# Patient Record
Sex: Male | Born: 1940 | Race: White | Hispanic: No | Marital: Married | Smoking: Former smoker
Health system: Southern US, Community
[De-identification: ages and names within clinical notes are randomized; demographics above are authoritative.]

## PROBLEM LIST (undated history)

## (undated) DIAGNOSIS — M1990 Unspecified osteoarthritis, unspecified site: Secondary | ICD-10-CM

## (undated) DIAGNOSIS — F329 Major depressive disorder, single episode, unspecified: Secondary | ICD-10-CM

## (undated) DIAGNOSIS — N429 Disorder of prostate, unspecified: Secondary | ICD-10-CM

## (undated) DIAGNOSIS — I2510 Atherosclerotic heart disease of native coronary artery without angina pectoris: Secondary | ICD-10-CM

## (undated) DIAGNOSIS — K8020 Calculus of gallbladder without cholecystitis without obstruction: Secondary | ICD-10-CM

## (undated) DIAGNOSIS — E1142 Type 2 diabetes mellitus with diabetic polyneuropathy: Secondary | ICD-10-CM

## (undated) DIAGNOSIS — E349 Endocrine disorder, unspecified: Secondary | ICD-10-CM

## (undated) DIAGNOSIS — N401 Enlarged prostate with lower urinary tract symptoms: Secondary | ICD-10-CM

## (undated) DIAGNOSIS — E538 Deficiency of other specified B group vitamins: Secondary | ICD-10-CM

## (undated) DIAGNOSIS — R7989 Other specified abnormal findings of blood chemistry: Secondary | ICD-10-CM

## (undated) DIAGNOSIS — N183 Chronic kidney disease, stage 3 unspecified: Secondary | ICD-10-CM

## (undated) DIAGNOSIS — J189 Pneumonia, unspecified organism: Secondary | ICD-10-CM

## (undated) DIAGNOSIS — E785 Hyperlipidemia, unspecified: Secondary | ICD-10-CM

## (undated) DIAGNOSIS — K8050 Calculus of bile duct without cholangitis or cholecystitis without obstruction: Secondary | ICD-10-CM

## (undated) DIAGNOSIS — R269 Unspecified abnormalities of gait and mobility: Secondary | ICD-10-CM

## (undated) DIAGNOSIS — G4730 Sleep apnea, unspecified: Secondary | ICD-10-CM

## (undated) DIAGNOSIS — R918 Other nonspecific abnormal finding of lung field: Secondary | ICD-10-CM

## (undated) DIAGNOSIS — Z8781 Personal history of (healed) traumatic fracture: Secondary | ICD-10-CM

## (undated) DIAGNOSIS — M5090 Cervical disc disorder, unspecified, unspecified cervical region: Secondary | ICD-10-CM

## (undated) DIAGNOSIS — E119 Type 2 diabetes mellitus without complications: Secondary | ICD-10-CM

## (undated) DIAGNOSIS — I509 Heart failure, unspecified: Secondary | ICD-10-CM

## (undated) DIAGNOSIS — R159 Full incontinence of feces: Secondary | ICD-10-CM

## (undated) DIAGNOSIS — I2699 Other pulmonary embolism without acute cor pulmonale: Secondary | ICD-10-CM

## (undated) DIAGNOSIS — M109 Gout, unspecified: Secondary | ICD-10-CM

## (undated) DIAGNOSIS — I10 Essential (primary) hypertension: Secondary | ICD-10-CM

## (undated) DIAGNOSIS — N529 Male erectile dysfunction, unspecified: Secondary | ICD-10-CM

## (undated) DIAGNOSIS — Z9229 Personal history of other drug therapy: Secondary | ICD-10-CM

## (undated) DIAGNOSIS — J986 Disorders of diaphragm: Secondary | ICD-10-CM

## (undated) DIAGNOSIS — R634 Abnormal weight loss: Secondary | ICD-10-CM

## (undated) DIAGNOSIS — I5189 Other ill-defined heart diseases: Secondary | ICD-10-CM

## (undated) DIAGNOSIS — R5381 Other malaise: Secondary | ICD-10-CM

## (undated) DIAGNOSIS — R609 Edema, unspecified: Secondary | ICD-10-CM

## (undated) DIAGNOSIS — K219 Gastro-esophageal reflux disease without esophagitis: Secondary | ICD-10-CM

## (undated) DIAGNOSIS — K1379 Other lesions of oral mucosa: Secondary | ICD-10-CM

## (undated) DIAGNOSIS — R3915 Urgency of urination: Secondary | ICD-10-CM

## (undated) HISTORY — PX: OTHER SURGICAL HISTORY: SHX169

---

## 2012-09-02 HISTORY — PX: CARDIAC CATHETERIZATION: SHX172

## 2015-02-17 ENCOUNTER — Other Ambulatory Visit: Payer: Self-pay | Admitting: Orthopaedic Surgery

## 2015-02-17 DIAGNOSIS — M4716 Other spondylosis with myelopathy, lumbar region: Secondary | ICD-10-CM

## 2015-02-17 DIAGNOSIS — M4712 Other spondylosis with myelopathy, cervical region: Secondary | ICD-10-CM

## 2015-02-17 DIAGNOSIS — M47894 Other spondylosis, thoracic region: Secondary | ICD-10-CM

## 2015-11-22 DIAGNOSIS — M109 Gout, unspecified: Secondary | ICD-10-CM | POA: Diagnosis present

## 2015-11-22 DIAGNOSIS — R5383 Other fatigue: Secondary | ICD-10-CM

## 2015-11-22 DIAGNOSIS — E538 Deficiency of other specified B group vitamins: Secondary | ICD-10-CM | POA: Insufficient documentation

## 2015-11-22 DIAGNOSIS — J8410 Pulmonary fibrosis, unspecified: Secondary | ICD-10-CM | POA: Insufficient documentation

## 2015-11-22 DIAGNOSIS — K219 Gastro-esophageal reflux disease without esophagitis: Secondary | ICD-10-CM | POA: Diagnosis present

## 2015-11-22 DIAGNOSIS — R609 Edema, unspecified: Secondary | ICD-10-CM | POA: Insufficient documentation

## 2015-11-22 DIAGNOSIS — E782 Mixed hyperlipidemia: Secondary | ICD-10-CM | POA: Insufficient documentation

## 2015-11-22 DIAGNOSIS — Z79899 Other long term (current) drug therapy: Secondary | ICD-10-CM

## 2015-11-22 DIAGNOSIS — N529 Male erectile dysfunction, unspecified: Secondary | ICD-10-CM | POA: Insufficient documentation

## 2015-11-22 DIAGNOSIS — E349 Endocrine disorder, unspecified: Secondary | ICD-10-CM | POA: Insufficient documentation

## 2015-11-22 DIAGNOSIS — F329 Major depressive disorder, single episode, unspecified: Secondary | ICD-10-CM | POA: Insufficient documentation

## 2015-11-22 DIAGNOSIS — R918 Other nonspecific abnormal finding of lung field: Secondary | ICD-10-CM | POA: Insufficient documentation

## 2015-11-22 DIAGNOSIS — M5090 Cervical disc disorder, unspecified, unspecified cervical region: Secondary | ICD-10-CM | POA: Insufficient documentation

## 2015-11-22 DIAGNOSIS — Z7901 Long term (current) use of anticoagulants: Secondary | ICD-10-CM

## 2015-11-22 DIAGNOSIS — R5381 Other malaise: Secondary | ICD-10-CM | POA: Insufficient documentation

## 2015-11-22 DIAGNOSIS — I10 Essential (primary) hypertension: Secondary | ICD-10-CM | POA: Diagnosis present

## 2016-01-24 DIAGNOSIS — J986 Disorders of diaphragm: Secondary | ICD-10-CM | POA: Insufficient documentation

## 2016-01-24 DIAGNOSIS — R269 Unspecified abnormalities of gait and mobility: Secondary | ICD-10-CM

## 2016-04-15 DIAGNOSIS — M159 Polyosteoarthritis, unspecified: Secondary | ICD-10-CM | POA: Insufficient documentation

## 2016-04-15 DIAGNOSIS — M8949 Other hypertrophic osteoarthropathy, multiple sites: Secondary | ICD-10-CM | POA: Insufficient documentation

## 2016-04-15 DIAGNOSIS — M150 Primary generalized (osteo)arthritis: Secondary | ICD-10-CM

## 2016-04-16 DIAGNOSIS — E1142 Type 2 diabetes mellitus with diabetic polyneuropathy: Secondary | ICD-10-CM | POA: Diagnosis present

## 2016-04-16 DIAGNOSIS — B351 Tinea unguium: Secondary | ICD-10-CM | POA: Insufficient documentation

## 2016-04-16 DIAGNOSIS — M79671 Pain in right foot: Secondary | ICD-10-CM | POA: Insufficient documentation

## 2016-05-16 DIAGNOSIS — L84 Corns and callosities: Secondary | ICD-10-CM | POA: Insufficient documentation

## 2016-05-16 DIAGNOSIS — M2041 Other hammer toe(s) (acquired), right foot: Secondary | ICD-10-CM | POA: Insufficient documentation

## 2017-02-19 DIAGNOSIS — Z86711 Personal history of pulmonary embolism: Secondary | ICD-10-CM | POA: Diagnosis present

## 2017-03-24 ENCOUNTER — Other Ambulatory Visit: Payer: Self-pay | Admitting: Anesthesiology

## 2017-04-25 ENCOUNTER — Encounter (HOSPITAL_COMMUNITY): Admission: RE | Payer: Self-pay | Source: Ambulatory Visit

## 2017-04-25 ENCOUNTER — Ambulatory Visit (HOSPITAL_COMMUNITY): Admission: RE | Admit: 2017-04-25 | Payer: Medicare HMO | Source: Ambulatory Visit | Admitting: Anesthesiology

## 2017-04-25 SURGERY — SPINAL CORD STIMULATOR BATTERY EXCHANGE
Anesthesia: General

## 2017-08-22 DIAGNOSIS — R2689 Other abnormalities of gait and mobility: Secondary | ICD-10-CM | POA: Diagnosis present

## 2017-12-02 DIAGNOSIS — N401 Enlarged prostate with lower urinary tract symptoms: Secondary | ICD-10-CM | POA: Diagnosis present

## 2017-12-02 DIAGNOSIS — R350 Frequency of micturition: Secondary | ICD-10-CM

## 2018-01-07 DIAGNOSIS — E6609 Other obesity due to excess calories: Secondary | ICD-10-CM

## 2018-01-07 DIAGNOSIS — Z6830 Body mass index (BMI) 30.0-30.9, adult: Secondary | ICD-10-CM

## 2018-07-30 DIAGNOSIS — N183 Chronic kidney disease, stage 3 unspecified: Secondary | ICD-10-CM | POA: Diagnosis present

## 2018-12-28 ENCOUNTER — Encounter (HOSPITAL_COMMUNITY): Payer: Self-pay

## 2018-12-28 ENCOUNTER — Inpatient Hospital Stay (HOSPITAL_COMMUNITY)
Admission: EM | Admit: 2018-12-28 | Discharge: 2019-01-02 | DRG: 418 | Disposition: A | Discharge status: Home / Self Care | Payer: Medicare HMO | Source: Other Acute Inpatient Hospital | Attending: Internal Medicine | Admitting: Internal Medicine

## 2018-12-28 DIAGNOSIS — I5032 Chronic diastolic (congestive) heart failure: Secondary | ICD-10-CM | POA: Diagnosis present

## 2018-12-28 DIAGNOSIS — E6609 Other obesity due to excess calories: Secondary | ICD-10-CM | POA: Diagnosis present

## 2018-12-28 DIAGNOSIS — R2689 Other abnormalities of gait and mobility: Secondary | ICD-10-CM | POA: Diagnosis not present

## 2018-12-28 DIAGNOSIS — Z96641 Presence of right artificial hip joint: Secondary | ICD-10-CM | POA: Diagnosis present

## 2018-12-28 DIAGNOSIS — R791 Abnormal coagulation profile: Secondary | ICD-10-CM | POA: Diagnosis present

## 2018-12-28 DIAGNOSIS — N183 Chronic kidney disease, stage 3 unspecified: Secondary | ICD-10-CM | POA: Diagnosis present

## 2018-12-28 DIAGNOSIS — R1011 Right upper quadrant pain: Secondary | ICD-10-CM | POA: Diagnosis present

## 2018-12-28 DIAGNOSIS — Z79891 Long term (current) use of opiate analgesic: Secondary | ICD-10-CM

## 2018-12-28 DIAGNOSIS — Z87891 Personal history of nicotine dependence: Secondary | ICD-10-CM

## 2018-12-28 DIAGNOSIS — R159 Full incontinence of feces: Secondary | ICD-10-CM | POA: Diagnosis present

## 2018-12-28 DIAGNOSIS — K429 Umbilical hernia without obstruction or gangrene: Secondary | ICD-10-CM | POA: Diagnosis present

## 2018-12-28 DIAGNOSIS — K660 Peritoneal adhesions (postprocedural) (postinfection): Secondary | ICD-10-CM | POA: Diagnosis present

## 2018-12-28 DIAGNOSIS — R634 Abnormal weight loss: Secondary | ICD-10-CM | POA: Diagnosis present

## 2018-12-28 DIAGNOSIS — K828 Other specified diseases of gallbladder: Secondary | ICD-10-CM | POA: Diagnosis present

## 2018-12-28 DIAGNOSIS — K8050 Calculus of bile duct without cholangitis or cholecystitis without obstruction: Secondary | ICD-10-CM | POA: Diagnosis present

## 2018-12-28 DIAGNOSIS — I2510 Atherosclerotic heart disease of native coronary artery without angina pectoris: Secondary | ICD-10-CM | POA: Diagnosis present

## 2018-12-28 DIAGNOSIS — R269 Unspecified abnormalities of gait and mobility: Secondary | ICD-10-CM

## 2018-12-28 DIAGNOSIS — J449 Chronic obstructive pulmonary disease, unspecified: Secondary | ICD-10-CM | POA: Diagnosis present

## 2018-12-28 DIAGNOSIS — I2699 Other pulmonary embolism without acute cor pulmonale: Secondary | ICD-10-CM

## 2018-12-28 DIAGNOSIS — K8066 Calculus of gallbladder and bile duct with acute and chronic cholecystitis without obstruction: Secondary | ICD-10-CM | POA: Diagnosis present

## 2018-12-28 DIAGNOSIS — E785 Hyperlipidemia, unspecified: Secondary | ICD-10-CM | POA: Diagnosis present

## 2018-12-28 DIAGNOSIS — Z7984 Long term (current) use of oral hypoglycemic drugs: Secondary | ICD-10-CM

## 2018-12-28 DIAGNOSIS — Z01810 Encounter for preprocedural cardiovascular examination: Secondary | ICD-10-CM | POA: Diagnosis not present

## 2018-12-28 DIAGNOSIS — K219 Gastro-esophageal reflux disease without esophagitis: Secondary | ICD-10-CM | POA: Diagnosis present

## 2018-12-28 DIAGNOSIS — M109 Gout, unspecified: Secondary | ICD-10-CM | POA: Diagnosis present

## 2018-12-28 DIAGNOSIS — J4489 Other specified chronic obstructive pulmonary disease: Secondary | ICD-10-CM | POA: Diagnosis present

## 2018-12-28 DIAGNOSIS — H9190 Unspecified hearing loss, unspecified ear: Secondary | ICD-10-CM | POA: Diagnosis present

## 2018-12-28 DIAGNOSIS — Z881 Allergy status to other antibiotic agents status: Secondary | ICD-10-CM

## 2018-12-28 DIAGNOSIS — E119 Type 2 diabetes mellitus without complications: Secondary | ICD-10-CM

## 2018-12-28 DIAGNOSIS — E1122 Type 2 diabetes mellitus with diabetic chronic kidney disease: Secondary | ICD-10-CM | POA: Diagnosis present

## 2018-12-28 DIAGNOSIS — I10 Essential (primary) hypertension: Secondary | ICD-10-CM | POA: Diagnosis not present

## 2018-12-28 DIAGNOSIS — R350 Frequency of micturition: Secondary | ICD-10-CM | POA: Diagnosis present

## 2018-12-28 DIAGNOSIS — E1142 Type 2 diabetes mellitus with diabetic polyneuropathy: Secondary | ICD-10-CM | POA: Diagnosis present

## 2018-12-28 DIAGNOSIS — Z7901 Long term (current) use of anticoagulants: Secondary | ICD-10-CM

## 2018-12-28 DIAGNOSIS — K8020 Calculus of gallbladder without cholecystitis without obstruction: Secondary | ICD-10-CM

## 2018-12-28 DIAGNOSIS — Z79899 Other long term (current) drug therapy: Secondary | ICD-10-CM

## 2018-12-28 DIAGNOSIS — Z6830 Body mass index (BMI) 30.0-30.9, adult: Secondary | ICD-10-CM | POA: Diagnosis not present

## 2018-12-28 DIAGNOSIS — Z86711 Personal history of pulmonary embolism: Secondary | ICD-10-CM | POA: Diagnosis present

## 2018-12-28 DIAGNOSIS — I130 Hypertensive heart and chronic kidney disease with heart failure and stage 1 through stage 4 chronic kidney disease, or unspecified chronic kidney disease: Secondary | ICD-10-CM | POA: Diagnosis present

## 2018-12-28 DIAGNOSIS — N401 Enlarged prostate with lower urinary tract symptoms: Secondary | ICD-10-CM | POA: Diagnosis present

## 2018-12-28 DIAGNOSIS — T45515A Adverse effect of anticoagulants, initial encounter: Secondary | ICD-10-CM

## 2018-12-28 DIAGNOSIS — Z888 Allergy status to other drugs, medicaments and biological substances status: Secondary | ICD-10-CM

## 2018-12-28 HISTORY — DX: Personal history of other drug therapy: Z92.29

## 2018-12-28 HISTORY — DX: Calculus of gallbladder without cholecystitis without obstruction: K80.20

## 2018-12-28 HISTORY — DX: Essential (primary) hypertension: I10

## 2018-12-28 HISTORY — DX: Disorder of prostate, unspecified: N42.9

## 2018-12-28 HISTORY — DX: Sleep apnea, unspecified: G47.30

## 2018-12-28 HISTORY — DX: Gastro-esophageal reflux disease without esophagitis: K21.9

## 2018-12-28 HISTORY — DX: Heart failure, unspecified: I50.9

## 2018-12-28 HISTORY — DX: Type 2 diabetes mellitus without complications: E11.9

## 2018-12-28 HISTORY — DX: Hyperlipidemia, unspecified: E78.5

## 2018-12-28 HISTORY — DX: Atherosclerotic heart disease of native coronary artery without angina pectoris: I25.10

## 2018-12-28 HISTORY — DX: Other pulmonary embolism without acute cor pulmonale: I26.99

## 2018-12-28 MED ORDER — KETOROLAC TROMETHAMINE 15 MG/ML IJ SOLN
15.0000 mg | Freq: Four times a day (QID) | INTRAMUSCULAR | Status: DC | PRN
  Administered 2019-01-01 – 2019-01-02 (×3): 15 mg via INTRAVENOUS
  Filled 2018-12-28 (×3): qty 1

## 2018-12-28 MED ORDER — SODIUM CHLORIDE 0.45 % IV SOLN
INTRAVENOUS | Status: DC
  Administered 2018-12-29 – 2019-01-02 (×6): via INTRAVENOUS

## 2018-12-28 MED ORDER — HEPARIN SODIUM (PORCINE) 5000 UNIT/ML IJ SOLN: 5000.0000 [IU] | Freq: Three times a day (TID) | INTRAMUSCULAR | Status: DC

## 2018-12-28 MED ORDER — ONDANSETRON HCL 4 MG PO TABS: 4.0000 mg | ORAL_TABLET | Freq: Four times a day (QID) | ORAL | Status: DC | PRN

## 2018-12-28 MED ORDER — ONDANSETRON HCL 4 MG/2ML IJ SOLN: 4.0000 mg | Freq: Four times a day (QID) | INTRAMUSCULAR | Status: DC | PRN

## 2018-12-28 NOTE — H&P (Signed)
History and Physical   Brandon CloseCramer J Lofquist WUJ:[id number] DOB: [birth year] DOA: 12/28/2018  Referring MD/NP/PA: [facility]  PCP: Gordan PaymentGrisso, Greg A., MD   Outpatient Specialists: None  [facility]  Chief Complaint: Right upper quadrant abdominal pain  HPI: Brandon [name] is a 78 y.o. male with medical history significant of diastolic dysfunction CHF, bilateral pulmonary emboli status post Coumadin therapy ongoing, nonobstructive coronary artery disease status post cardiac cath in 2014, hypertension, diabetes, hyperlipidemia, prostate disease, GERD who was [facility] with complaint of progressive weight loss over the past 2 months up to 10 pounds, stool incontinence occasionally.  Patient was seen by his PCP and noted elevation in his liver function tests.  Based on that he was sent to the ER for review.  Initial evaluation in the ER including by ultrasound showed elevated liver function tests.  Patient was evaluated using ultrasound that showed choledocholithiasis with multiple gallstones in the gallbladder.  He has positive Murphy sign but no evidence of acute cholecystitis.  Patient sent over for GI evaluation.  He is on chronic Coumadin with elevated INR so unable to do immediate invasive procedure.  Patient still complains of some nausea and vomiting which he has had on and off.  Mainly stimulated by meals.  This has decreased his appetite which is why he lost the weight.  He has no pain at rest..  ED Course: [facility]als were stable.  White count 7.6 hemoglobin 12.7 and platelets 210.  Sodium 136 potassium 4.4 and chloride 101.  BUN is 15 creatinine 0.9.  His INR is 3.7.  LFTs showed a total bilirubin 2.5 ALT 167 AST 247 and alkaline phosphatase 292.  Ultrasound the right upper quadrant showed sludge with small stone and ductal prominence.  Largest stone 4 mm.  Suspicion for choledocholithiasis with no cholecystitis.  Patient sent over  here for work-up.  Review of Systems: As per HPI otherwise 10 point review of systems negative.    History reviewed. No pertinent past medical history.  Past Surgical History:  Procedure Laterality Date  . CARDIAC CATHETERIZATION       reports that he quit smoking about 33 years ago. He started smoking about 43 years ago. He has a 2.50 pack-year smoking history. He has never used smokeless tobacco. He reports that he does not drink alcohol or use drugs.  Allergies  Allergen Reactions  . Statins     Other reaction(s): Cramps (ALLERGY/intolerance)  . Allopurinol Rash    Unknown  . Doxycycline Rash    History reviewed. No pertinent family history.   Prior to Admission medications   Not on File    Physical Exam: Vitals:   12/28/18 2224  BP: 121/85  Pulse: 61  Resp: 18  Temp: 98 F (36.7 C)  TempSrc: Oral  SpO2: 97%  Weight: 99.8 kg  Height: 6\' 4"  (1.93 m)      Constitutional: NAD, calm, comfortable, hard of hearing Vitals:   12/28/18 2224  BP: 121/85  Pulse: 61  Resp: 18  Temp: 98 F (36.7 C)  TempSrc: Oral  SpO2: 97%  Weight: 99.8 kg  Height: 6\' 4"  (1.93 m)   Eyes: PERRL, lids and conjunctivae normal ENMT: Mucous membranes are moist. Posterior pharynx clear of any exudate or lesions.Normal dentition.  Neck: normal, supple, no masses, no thyromegaly Respiratory: clear to auscultation bilaterally, no wheezing, no crackles. Normal respiratory effort. No accessory muscle use.  Cardiovascular: Regular rate and rhythm,  no murmurs / rubs / gallops.  Trace bilateral lower extremity edema . 2+ pedal pulses. No carotid bruits.  Abdomen: Right upper quadrant abdominal tenderness,, no masses palpated. No hepatosplenomegaly. Bowel sounds positive.  Musculoskeletal: no clubbing / cyanosis. No joint deformity upper and lower extremities. Good ROM, no contractures. Normal muscle tone.  Skin: no rashes, lesions, ulcers. No induration Neurologic: CN 2-12 grossly intact.  Sensation intact, DTR normal. Strength 5/5 in all 4.  Psychiatric: Normal judgment and insight. Alert and oriented x 3. Normal mood.     Labs on Admission: I have personally reviewed following labs and imaging studies  CBC: No results for input(s): WBC, NEUTROABS, HGB, HCT, MCV, PLT in the last 168 hours. Basic Metabolic Panel: No results for input(s): NA, K, CL, CO2, GLUCOSE, BUN, CREATININE, CALCIUM, MG, PHOS in the last 168 hours. GFR: CrCl cannot be calculated (No successful lab value found.). Liver Function Tests: No results for input(s): AST, ALT, ALKPHOS, BILITOT, PROT, ALBUMIN in the last 168 hours. No results for input(s): LIPASE, AMYLASE in the last 168 hours. No results for input(s): AMMONIA in the last 168 hours. Coagulation Profile: No results for input(s): INR, PROTIME in the last 168 hours. Cardiac Enzymes: No results for input(s): CKTOTAL, CKMB, CKMBINDEX, TROPONINI in the last 168 hours. BNP (last 3 results) No results for input(s): PROBNP in the last 8760 hours. HbA1C: No results for input(s): HGBA1C in the last 72 hours. CBG: No results for input(s): GLUCAP in the last 168 hours. Lipid Profile: No results for input(s): CHOL, HDL, LDLCALC, TRIG, CHOLHDL, LDLDIRECT in the last 72 hours. Thyroid Function Tests: No results for input(s): TSH, T4TOTAL, FREET4, T3FREE, THYROIDAB in the last 72 hours. Anemia Panel: No results for input(s): VITAMINB12, FOLATE, FERRITIN, TIBC, IRON, RETICCTPCT in the last 72 hours. Urine analysis: No results found for: COLORURINE, APPEARANCEUR, LABSPEC, PHURINE, GLUCOSEU, HGBUR, BILIRUBINUR, KETONESUR, PROTEINUR, UROBILINOGEN, NITRITE, LEUKOCYTESUR Sepsis Labs: (procalcitonin:4,lacticidven:4) )No results found for this or any previous visit (from the past 240 hour(s)).   Radiological Exams on Admission: No results found.   Assessment/Plan Principal Problem:   Choledocholithiasis Active Problems:   Chronic diastolic  CHF (congestive heart failure) (HCC)   HTN (hypertension)   COPD with chronic bronchitis (HCC)   Pulmonary embolism on long-term anticoagulation therapy (HCC)   DM (diabetes mellitus) (HCC)     #1 choledocholithiasis: Patient has right upper quadrant abdominal tenderness otherwise asymptomatic at rest.  We will keep him on clear liquid diet with n.p.o. in the morning.  GI will be consulted for possible ERCP or MRCP.  Control nausea with vomiting.  Allow INR to drift down to less than 2 prior to any invasive procedure.  #2 history of pulmonary emboli: Currently on anticoagulation.  Patient will be placed on SCD.  Hold warfarin for now in anticipation of possible invasive procedure.  Resume warfarin after procedure.  #3 hypertension: We will resume blood pressure medications as soon as practicable.  #4 diabetes: Sliding scale insulin will be initiated.  #5 history of COPD: No exacerbation.  Continue home medicine once verified.  #6 chronic diastolic CHF: Patient has trace lower extremity edema.  No evidence of fluid overload at the moment.   DVT prophylaxis: SCD Code Status: Full code Family Communication: No family with patient so discussed care with patient directly Disposition Plan: To be determined Consults called: None tonight.  Call GI in the morning Admission status: Inpatient  Severity of Illness: The appropriate patient status for this patient is INPATIENT. Inpatient  status is judged to be reasonable and necessary in order to provide the required intensity of service to ensure the patient's safety. The patient's presenting symptoms, physical exam findings, and initial radiographic and laboratory data in the context of their chronic comorbidities is felt to place them at high risk for further clinical deterioration. Furthermore, it is not anticipated that the patient will be medically stable for discharge from the hospital within 2 midnights of admission. The following factors  support the patient status of inpatient.   " The patient's presenting symptoms include abdominal pain with weight loss. " The worrisome physical exam findings include right upper quadrant abdominal tenderness. " The initial radiographic and laboratory data are worrisome because of ultrasound showing gallbladder stones with choledocholithiasis. " The chronic co-morbidities include diabetes.   * I certify that at the point of admission it is my clinical judgment that the patient will require inpatient hospital care spanning beyond 2 midnights from the point of admission due to high intensity of service, high risk for further deterioration and high frequency of surveillance required.Lonia Blood MD [facility] Pager [phone]  If 7PM-7AM, please contact night-coverage [email or web address] Password [facility]  12/29/2018, 12:35 AM

## 2018-12-28 NOTE — Plan of Care (Signed)
Plan of care discussed.   

## 2018-12-29 ENCOUNTER — Other Ambulatory Visit: Payer: Self-pay

## 2018-12-29 ENCOUNTER — Encounter (HOSPITAL_COMMUNITY): Payer: Self-pay | Admitting: Internal Medicine

## 2018-12-29 DIAGNOSIS — T45515A Adverse effect of anticoagulants, initial encounter: Secondary | ICD-10-CM

## 2018-12-29 DIAGNOSIS — E1142 Type 2 diabetes mellitus with diabetic polyneuropathy: Secondary | ICD-10-CM

## 2018-12-29 DIAGNOSIS — R350 Frequency of micturition: Secondary | ICD-10-CM

## 2018-12-29 DIAGNOSIS — Z7901 Long term (current) use of anticoagulants: Secondary | ICD-10-CM

## 2018-12-29 DIAGNOSIS — J4489 Other specified chronic obstructive pulmonary disease: Secondary | ICD-10-CM | POA: Diagnosis present

## 2018-12-29 DIAGNOSIS — K8020 Calculus of gallbladder without cholecystitis without obstruction: Secondary | ICD-10-CM

## 2018-12-29 DIAGNOSIS — E119 Type 2 diabetes mellitus without complications: Secondary | ICD-10-CM

## 2018-12-29 DIAGNOSIS — N183 Chronic kidney disease, stage 3 (moderate): Secondary | ICD-10-CM

## 2018-12-29 DIAGNOSIS — J449 Chronic obstructive pulmonary disease, unspecified: Secondary | ICD-10-CM | POA: Diagnosis present

## 2018-12-29 DIAGNOSIS — I5032 Chronic diastolic (congestive) heart failure: Secondary | ICD-10-CM

## 2018-12-29 DIAGNOSIS — N401 Enlarged prostate with lower urinary tract symptoms: Secondary | ICD-10-CM

## 2018-12-29 DIAGNOSIS — I10 Essential (primary) hypertension: Secondary | ICD-10-CM

## 2018-12-29 DIAGNOSIS — R2689 Other abnormalities of gait and mobility: Secondary | ICD-10-CM

## 2018-12-29 DIAGNOSIS — I2699 Other pulmonary embolism without acute cor pulmonale: Secondary | ICD-10-CM

## 2018-12-29 LAB — CBC
HCT: 38.5 % — ABNORMAL LOW (ref 39.0–52.0)
Hemoglobin: 11.7 g/dL — ABNORMAL LOW (ref 13.0–17.0)
MCH: 28.3 pg (ref 26.0–34.0)
MCHC: 30.4 g/dL (ref 30.0–36.0)
MCV: 93.2 fL (ref 80.0–100.0)
Platelets: 226 10*3/uL (ref 150–400)
RBC: 4.13 MIL/uL — ABNORMAL LOW (ref 4.22–5.81)
RDW: 15.9 % — ABNORMAL HIGH (ref 11.5–15.5)
WBC: 8.5 10*3/uL (ref 4.0–10.5)
nRBC: 0 % (ref 0.0–0.2)

## 2018-12-29 LAB — PROTIME-INR
INR: 2.3 — ABNORMAL HIGH (ref 0.8–1.2)
Prothrombin Time: 25.2 seconds — ABNORMAL HIGH (ref 11.4–15.2)

## 2018-12-29 LAB — MAGNESIUM: Magnesium: 2.1 mg/dL (ref 1.7–2.4)

## 2018-12-29 LAB — COMPREHENSIVE METABOLIC PANEL
ALT: 116 U/L — ABNORMAL HIGH (ref 0–44)
AST: 167 U/L — ABNORMAL HIGH (ref 15–41)
Albumin: 3.1 g/dL — ABNORMAL LOW (ref 3.5–5.0)
Alkaline Phosphatase: 261 U/L — ABNORMAL HIGH (ref 38–126)
Anion gap: 7 (ref 5–15)
BUN: 14 mg/dL (ref 8–23)
CO2: 24 mmol/L (ref 22–32)
Calcium: 9 mg/dL (ref 8.9–10.3)
Chloride: 108 mmol/L (ref 98–111)
Creatinine, Ser: 1 mg/dL (ref 0.61–1.24)
GFR calc Af Amer: >60 mL/min (ref >60)
GFR calc non Af Amer: >60 mL/min (ref >60)
Glucose, Bld: 81 mg/dL (ref 70–99)
Potassium: 3.7 mmol/L (ref 3.5–5.1)
Sodium: 139 mmol/L (ref 135–145)
Total Bilirubin: 2.8 mg/dL — ABNORMAL HIGH (ref 0.3–1.2)
Total Protein: 6.3 g/dL — ABNORMAL LOW (ref 6.5–8.1)

## 2018-12-29 LAB — PHOSPHORUS: Phosphorus: 3 mg/dL (ref 2.5–4.6)

## 2018-12-29 LAB — SURGICAL PCR SCREEN
MRSA, PCR: NEGATIVE
Staphylococcus aureus: NEGATIVE

## 2018-12-29 LAB — GLUCOSE, CAPILLARY
Glucose-Capillary: 145 mg/dL — ABNORMAL HIGH (ref 70–99)
Glucose-Capillary: 84 mg/dL (ref 70–99)
Glucose-Capillary: 86 mg/dL (ref 70–99)
Glucose-Capillary: 108 mg/dL — ABNORMAL HIGH (ref 70–99)

## 2018-12-29 MED ORDER — PANTOPRAZOLE SODIUM 40 MG PO TBEC
40.0000 mg | DELAYED_RELEASE_TABLET | Freq: Every day | ORAL | Status: DC
  Administered 2018-12-29 – 2019-01-02 (×5): 40 mg via ORAL
  Filled 2018-12-29 (×5): qty 1

## 2018-12-29 MED ORDER — COLCHICINE 0.6 MG PO TABS
0.6000 mg | ORAL_TABLET | Freq: Every day | ORAL | Status: DC
  Administered 2018-12-29 – 2019-01-02 (×5): 0.6 mg via ORAL
  Filled 2018-12-29 (×5): qty 1

## 2018-12-29 MED ORDER — OXYCODONE-ACETAMINOPHEN 10-325 MG PO TABS: 1.0000 | ORAL_TABLET | Freq: Two times a day (BID) | ORAL | Status: DC

## 2018-12-29 MED ORDER — FENOFIBRATE 160 MG PO TABS
160.0000 mg | ORAL_TABLET | Freq: Every day | ORAL | Status: DC
  Administered 2018-12-29 – 2019-01-02 (×5): 160 mg via ORAL
  Filled 2018-12-29 (×5): qty 1

## 2018-12-29 MED ORDER — BOOST / RESOURCE BREEZE PO LIQD CUSTOM
1.0000 | Freq: Three times a day (TID) | ORAL | Status: DC
  Administered 2018-12-29 – 2019-01-02 (×5): 1 via ORAL

## 2018-12-29 MED ORDER — OXYCODONE-ACETAMINOPHEN 5-325 MG PO TABS
1.0000 | ORAL_TABLET | Freq: Two times a day (BID) | ORAL | Status: DC
  Administered 2018-12-29 – 2019-01-02 (×8): 1 via ORAL
  Filled 2018-12-29 (×8): qty 1

## 2018-12-29 MED ORDER — FEBUXOSTAT 40 MG PO TABS
40.0000 mg | ORAL_TABLET | Freq: Every day | ORAL | Status: DC
  Administered 2018-12-29 – 2019-01-02 (×5): 40 mg via ORAL
  Filled 2018-12-29 (×5): qty 1

## 2018-12-29 MED ORDER — MUPIROCIN 2 % EX OINT
1.0000 "application " | TOPICAL_OINTMENT | Freq: Two times a day (BID) | CUTANEOUS | Status: DC
  Administered 2018-12-29 – 2019-01-02 (×9): 1 via NASAL
  Filled 2018-12-29 (×2): qty 22

## 2018-12-29 MED ORDER — OXYCODONE HCL 5 MG PO TABS
5.0000 mg | ORAL_TABLET | Freq: Two times a day (BID) | ORAL | Status: DC
  Administered 2018-12-29 – 2019-01-02 (×8): 5 mg via ORAL
  Filled 2018-12-29 (×8): qty 1

## 2018-12-29 MED ORDER — SODIUM CHLORIDE 0.9 % IV SOLN: 2.0000 g | INTRAVENOUS | Status: AC

## 2018-12-29 MED ORDER — DULOXETINE HCL 30 MG PO CPEP
30.0000 mg | ORAL_CAPSULE | Freq: Every day | ORAL | Status: DC
  Administered 2018-12-29 – 2019-01-02 (×5): 30 mg via ORAL
  Filled 2018-12-29 (×5): qty 1

## 2018-12-29 MED ORDER — FINASTERIDE 5 MG PO TABS
5.0000 mg | ORAL_TABLET | Freq: Every day | ORAL | Status: DC
  Administered 2018-12-29 – 2019-01-02 (×5): 5 mg via ORAL
  Filled 2018-12-29 (×5): qty 1

## 2018-12-29 MED ORDER — INSULIN ASPART 100 UNIT/ML ~~LOC~~ SOLN
0.0000 [IU] | Freq: Three times a day (TID) | SUBCUTANEOUS | Status: DC
  Administered 2018-12-29 – 2018-12-30 (×2): 1 [IU] via SUBCUTANEOUS
  Administered 2019-01-01 (×2): 2 [IU] via SUBCUTANEOUS
  Administered 2019-01-01: 17:00:00 1 [IU] via SUBCUTANEOUS

## 2018-12-29 MED ORDER — TAMSULOSIN HCL 0.4 MG PO CAPS
0.4000 mg | ORAL_CAPSULE | Freq: Every day | ORAL | Status: DC
  Administered 2018-12-30 – 2019-01-02 (×4): 0.4 mg via ORAL
  Filled 2018-12-29 (×5): qty 1

## 2018-12-29 NOTE — Consult Note (Signed)
[facility] Consultation Note  Referring Provider:  Dr. Marguerita Merles [facility]) Primary Care Physician:  Gordan Payment., MD  Reason for Consultation:  Elevated liver enzymes  HPI: Brandon [name] is a 78 y.o. male admitted for above indication.  He's had several months of smoldering right-sided abdominal pain, worse with movement, no clear association with eating.  And some associated diarrhea and nausea and poor appetite.  Possible weight loss.  No fever or overt jaundice.  No blood in stool.  U/S done yesterday showed gallstones and 9mm CBD and liver enzymes elevated.  INR yesterday >3 (on warfarin).   History reviewed. No pertinent past medical history.  Past Surgical History:  Procedure Laterality Date  . CARDIAC CATHETERIZATION      Prior to Admission medications   Medication Sig Start Date End Date Taking? Authorizing Provider  Colchicine 0.6 MG CAPS Take 0.6 mg by mouth daily.   Yes [provider]  DULoxetine (CYMBALTA) 30 MG capsule Take 30 mg by mouth daily. 12/17/18  Yes [provider]  febuxostat (ULORIC) 40 MG tablet Take 40 mg by mouth daily.   Yes [provider]  fenofibrate 160 MG tablet Take 160 mg by mouth daily. 12/17/18  Yes [provider]  finasteride (PROSCAR) 5 MG tablet Take 5 mg by mouth daily. 09/22/18  Yes [provider]  fosinopril (MONOPRIL) 40 MG tablet Take 40 mg by mouth daily. 09/28/18  Yes [provider]  furosemide (LASIX) 20 MG tablet Take 20 mg by mouth daily. 12/17/18  Yes [provider]  glipiZIDE (GLUCOTROL XL) 5 MG 24 hr tablet Take 5 mg by mouth daily. 12/17/18  Yes [provider]  omeprazole (PRILOSEC) 10 MG capsule Take 10 mg by mouth daily. 07/31/18  Yes [provider]  oxyCODONE-acetaminophen (PERCOCET) 10-325 MG tablet Take 1 tablet by mouth 2 (two) times a day.   Yes [provider]  tamsulosin (FLOMAX) 0.4 MG CAPS capsule Take 0.4 mg by  mouth daily. 12/17/18  Yes [provider]  warfarin (COUMADIN) 5 MG tablet Take 5 mg by mouth daily. 10/29/18  Yes [provider]    Current Facility-Administered Medications  Medication Dose Route Frequency Provider Last Rate Last Dose  . 0.45 % sodium chloride infusion   Intravenous Continuous Rometta Emery, MD 75 mL/hr at 12/29/18 0600    . feeding supplement (BOOST / RESOURCE BREEZE) liquid 1 Container  1 Container Oral TID BM Rometta Emery, MD   1 Container at 12/29/18 [phone]  . insulin aspart (novoLOG) injection 0-9 Units  0-9 Units Subcutaneous TID WC Rometta Emery, MD   1 Units at 12/29/18 0900  . ketorolac (TORADOL) 15 MG/ML injection 15 mg  15 mg Intravenous Q6H PRN Rometta Emery, MD      . mupirocin ointment (BACTROBAN) 2 % 1 application  1 application Nasal BID Therisa Doyne, MD   1 application at 12/29/18 0856  . ondansetron (ZOFRAN) tablet 4 mg  4 mg Oral Q6H PRN Rometta Emery, MD       Or  . ondansetron (ZOFRAN) injection 4 mg  4 mg Intravenous Q6H PRN Rometta Emery, MD        Allergies as of 12/28/2018 - Review Complete 12/28/2018  Allergen Reaction Noted  . Statins  04/14/2018  . Allopurinol Rash 11/22/2015  . Doxycycline Rash 03/31/2017    History reviewed. No pertinent family history.  Social History   Socioeconomic History  . Marital  status: Married    Spouse name: Burna Mortimer  . Number of children: 1  . Years of education: 35  . Highest education level: 12th grade  Occupational History  . Not on file  Social Needs  . Financial resource strain: Not very hard  . Food insecurity:    Worry: Never true    Inability: Never true  . Transportation needs:    Medical: No    Non-medical: No  Tobacco Use  . Smoking status: Former Smoker    Packs/day: 0.25    Years: 10.00    Pack years: 2.50    Start date: 1977    Last attempt to quit: 1987    Years since quitting: 33.3  . Smokeless tobacco: Never Used  . Tobacco  comment: declined  Substance and Sexual Activity  . Alcohol use: Never    Frequency: Never  . Drug use: Never  . Sexual activity: Not Currently  Lifestyle  . Physical activity:    Days per week: 0 days    Minutes per session: 0 min  . Stress: Only a little  Relationships  . Social connections:    Talks on phone: Three times a week    Gets together: Never    Attends religious service: 1 to 4 times per year    Active member of club or organization: No    Attends meetings of clubs or organizations: Never    Relationship status: Married  . Intimate partner violence:    Fear of current or ex partner: No    Emotionally abused: No    Physically abused: No    Forced sexual activity: No  Other Topics Concern  . 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  Does not go out much.     Review of Systems: As per HPI, all others negative  Physical Exam: Vital signs in last 24 hours: Temp:  [97.8 F (36.6 C)-98 F (36.7 C)] 97.8 F (36.6 C) (04/28 0618) Pulse Rate:  [60-61] 60 (04/28 0618) Resp:  [18] 18 (04/28 0618) BP: (121-139)/(81-85) 139/81 (04/28 0618) SpO2:  [93 %-97 %] 93 % (04/28 0618) Weight:  [99.8 kg] 99.8 kg (04/27 2224) Last BM Date: 12/29/18 General:   Alert,  Well-developed, well-nourished, pleasant and cooperative in NAD Head:  Normocephalic and atraumatic. Eyes:  Sclera slight icterus bilaterally.   Conjunctiva pink. Ears:  Normal auditory acuity. Nose:  No deformity, discharge,  or lesions. Mouth:  No deformity or lesions.  Oropharynx pink & moist. Neck:  Supple; no masses or thyromegaly. Lungs:  Clear throughout to auscultation.   No wheezes, crackles, or rhonchi. No acute distress. Heart:  Regular rate and rhythm; no murmurs, clicks, rubs,  or gallops. Abdomen:  Soft, right upper quadrant tenderness with some voluntary guarding upon deep palpation. No masses, hepatosplenomegaly or hernias noted. Normal bowel sounds, without  guarding, and without rebound.     Msk:  Symmetrical without gross deformities. Normal posture. Pulses:  Normal pulses noted. Extremities:  Bilateral SCDs; Without clubbing or edema. Neurologic:  Alert and  oriented x4;  Diffusely weak, otherwise grossly normal neurologically. Skin:  Scattered, ecchymoses, otherwise intact without significant lesions or rashes. Cervical Nodes:  No significant cervical adenopathy. Psych:  Alert and cooperative. Normal mood and affect.   Lab Results: Recent Labs    12/29/18 0409  WBC 8.5  HGB 11.7*  HCT 38.5*  PLT 226   BMET Recent Labs    12/29/18 0409  NA 139  K 3.7  CL 108  CO2 24  GLUCOSE 81  BUN 14  CREATININE 1.00  CALCIUM 9.0   LFT Recent Labs    12/29/18 0409  PROT 6.3*  ALBUMIN 3.1*  AST 167*  ALT 116*  ALKPHOS 261*  BILITOT 2.8*   PT/INR Recent Labs    12/29/18 0941  LABPROT 25.2*  INR 2.3*    Studies/Results: No results found.  Impression:  1.  Right-sided abdominal pain, several months. 2.  Elevated liver enzymes. 3.  Gallstones and bile duct prominence (9mm) on recent abdominal ultrasound (done elsewhere; on paper chart). 4.  Chronic anticoagulation (warfarin), for pulmonary emboli 5 years ago. 5.  Diarrhea; several months in duration; concordant with patient's abdominal pain.  Plan:  1.  MRCP (if patient can have it; though not in records, patient tells me he has what sounds like spinal cord stimulator for chronic back pain) with possible ERCP and subsequent surgical consultation to follow. 2.  If not able to have MRCP, would recommend surgical consultation first with intraoperative cholangiogram, and ERCP only if IOC is positive for choledocholithiasis. 3.  Case discussed with Dr. Marland McalpineSheikh of [facility].   LOS: 1 day   Shandrika Ambers M  12/29/2018, 11:19 AM  Cell [phone] If no answer or after 5 PM call [phone]

## 2018-12-29 NOTE — H&P (View-Only) (Signed)
[facility] Consultation Note  Referring Provider:  Dr. Omair Sheikh ([facility]) Primary Care Physician:  Grisso, Greg A., MD  Reason for Consultation:  Elevated liver enzymes  HPI: Brandon [name] is a 77 y.o. male admitted for above indication.  He's had several months of smoldering right-sided abdominal pain, worse with movement, no clear association with eating.  And some associated diarrhea and nausea and poor appetite.  Possible weight loss.  No fever or overt jaundice.  No blood in stool.  U/S done yesterday showed gallstones and 9mm CBD and liver enzymes elevated.  INR yesterday >3 (on warfarin).   History reviewed. No pertinent past medical history.  Past Surgical History:  Procedure Laterality Date  . CARDIAC CATHETERIZATION      Prior to Admission medications   Medication Sig Start Date End Date Taking? Authorizing Provider  Colchicine 0.6 MG CAPS Take 0.6 mg by mouth daily.   Yes [provider]  DULoxetine (CYMBALTA) 30 MG capsule Take 30 mg by mouth daily. 12/17/18  Yes [provider]  febuxostat (ULORIC) 40 MG tablet Take 40 mg by mouth daily.   Yes [provider]  fenofibrate 160 MG tablet Take 160 mg by mouth daily. 12/17/18  Yes [provider]  finasteride (PROSCAR) 5 MG tablet Take 5 mg by mouth daily. 09/22/18  Yes [provider]  fosinopril (MONOPRIL) 40 MG tablet Take 40 mg by mouth daily. 09/28/18  Yes [provider]  furosemide (LASIX) 20 MG tablet Take 20 mg by mouth daily. 12/17/18  Yes [provider]  glipiZIDE (GLUCOTROL XL) 5 MG 24 hr tablet Take 5 mg by mouth daily. 12/17/18  Yes [provider]  omeprazole (PRILOSEC) 10 MG capsule Take 10 mg by mouth daily. 07/31/18  Yes [provider]  oxyCODONE-acetaminophen (PERCOCET) 10-325 MG tablet Take 1 tablet by mouth 2 (two) times a day.   Yes [provider]  tamsulosin (FLOMAX) 0.4 MG CAPS capsule Take 0.4 mg by  mouth daily. 12/17/18  Yes [provider]  warfarin (COUMADIN) 5 MG tablet Take 5 mg by mouth daily. 10/29/18  Yes [provider]    Current Facility-Administered Medications  Medication Dose Route Frequency Provider Last Rate Last Dose  . 0.45 % sodium chloride infusion   Intravenous Continuous Garba, Mohammad L, MD 75 mL/hr at 12/29/18 0600    . feeding supplement (BOOST / RESOURCE BREEZE) liquid 1 Container  1 Container Oral TID BM Garba, Mohammad L, MD   1 Container at 12/29/18 0907  . insulin aspart (novoLOG) injection 0-9 Units  0-9 Units Subcutaneous TID WC Garba, Mohammad L, MD   1 Units at 12/29/18 0900  . ketorolac (TORADOL) 15 MG/ML injection 15 mg  15 mg Intravenous Q6H PRN Garba, Mohammad L, MD      . mupirocin ointment (BACTROBAN) 2 % 1 application  1 application Nasal BID Doutova, Anastassia, MD   1 application at 12/29/18 0856  . ondansetron (ZOFRAN) tablet 4 mg  4 mg Oral Q6H PRN Garba, Mohammad L, MD       Or  . ondansetron (ZOFRAN) injection 4 mg  4 mg Intravenous Q6H PRN Garba, Mohammad L, MD        Allergies as of 12/28/2018 - Review Complete 12/28/2018  Allergen Reaction Noted  . Statins  04/14/2018  . Allopurinol Rash 11/22/2015  . Doxycycline Rash 03/31/2017    History reviewed. No pertinent family history.  Social History   Socioeconomic History  . Marital   status: Married    Spouse name: Burna Mortimer  . Number of children: 1  . Years of education: 35  . Highest education level: 12th grade  Occupational History  . Not on file  Social Needs  . Financial resource strain: Not very hard  . Food insecurity:    Worry: Never true    Inability: Never true  . Transportation needs:    Medical: No    Non-medical: No  Tobacco Use  . Smoking status: Former Smoker    Packs/day: 0.25    Years: 10.00    Pack years: 2.50    Start date: 1977    Last attempt to quit: 1987    Years since quitting: 33.3  . Smokeless tobacco: Never Used  . Tobacco  comment: declined  Substance and Sexual Activity  . Alcohol use: Never    Frequency: Never  . Drug use: Never  . Sexual activity: Not Currently  Lifestyle  . Physical activity:    Days per week: 0 days    Minutes per session: 0 min  . Stress: Only a little  Relationships  . Social connections:    Talks on phone: Three times a week    Gets together: Never    Attends religious service: 1 to 4 times per year    Active member of club or organization: No    Attends meetings of clubs or organizations: Never    Relationship status: Married  . Intimate partner violence:    Fear of current or ex partner: No    Emotionally abused: No    Physically abused: No    Forced sexual activity: No  Other Topics Concern  . 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  Does not go out much.     Review of Systems: As per HPI, all others negative  Physical Exam: Vital signs in last 24 hours: Temp:  [97.8 F (36.6 C)-98 F (36.7 C)] 97.8 F (36.6 C) (04/28 0618) Pulse Rate:  [60-61] 60 (04/28 0618) Resp:  [18] 18 (04/28 0618) BP: (121-139)/(81-85) 139/81 (04/28 0618) SpO2:  [93 %-97 %] 93 % (04/28 0618) Weight:  [99.8 kg] 99.8 kg (04/27 2224) Last BM Date: 12/29/18 General:   Alert,  Well-developed, well-nourished, pleasant and cooperative in NAD Head:  Normocephalic and atraumatic. Eyes:  Sclera slight icterus bilaterally.   Conjunctiva pink. Ears:  Normal auditory acuity. Nose:  No deformity, discharge,  or lesions. Mouth:  No deformity or lesions.  Oropharynx pink & moist. Neck:  Supple; no masses or thyromegaly. Lungs:  Clear throughout to auscultation.   No wheezes, crackles, or rhonchi. No acute distress. Heart:  Regular rate and rhythm; no murmurs, clicks, rubs,  or gallops. Abdomen:  Soft, right upper quadrant tenderness with some voluntary guarding upon deep palpation. No masses, hepatosplenomegaly or hernias noted. Normal bowel sounds, without  guarding, and without rebound.     Msk:  Symmetrical without gross deformities. Normal posture. Pulses:  Normal pulses noted. Extremities:  Bilateral SCDs; Without clubbing or edema. Neurologic:  Alert and  oriented x4;  Diffusely weak, otherwise grossly normal neurologically. Skin:  Scattered, ecchymoses, otherwise intact without significant lesions or rashes. Cervical Nodes:  No significant cervical adenopathy. Psych:  Alert and cooperative. Normal mood and affect.   Lab Results: Recent Labs    12/29/18 0409  WBC 8.5  HGB 11.7*  HCT 38.5*  PLT 226   BMET Recent Labs    12/29/18 0409  NA 139  K 3.7  CL 108  CO2 24  GLUCOSE 81  BUN 14  CREATININE 1.00  CALCIUM 9.0   LFT Recent Labs    12/29/18 0409  PROT 6.3*  ALBUMIN 3.1*  AST 167*  ALT 116*  ALKPHOS 261*  BILITOT 2.8*   PT/INR Recent Labs    12/29/18 0941  LABPROT 25.2*  INR 2.3*    Studies/Results: No results found.  Impression:  1.  Right-sided abdominal pain, several months. 2.  Elevated liver enzymes. 3.  Gallstones and bile duct prominence (9mm) on recent abdominal ultrasound (done elsewhere; on paper chart). 4.  Chronic anticoagulation (warfarin), for pulmonary emboli 5 years ago. 5.  Diarrhea; several months in duration; concordant with patient's abdominal pain.  Plan:  1.  MRCP (if patient can have it; though not in records, patient tells me he has what sounds like spinal cord stimulator for chronic back pain) with possible ERCP and subsequent surgical consultation to follow. 2.  If not able to have MRCP, would recommend surgical consultation first with intraoperative cholangiogram, and ERCP only if IOC is positive for choledocholithiasis. 3.  Case discussed with Dr. Marland McalpineSheikh of [facility].   LOS: 1 day   Jakai Onofre M  12/29/2018, 11:19 AM  Cell [phone] If no answer or after 5 PM call [phone]

## 2018-12-29 NOTE — Consult Note (Signed)
[place] 1941/08/29  [id number].    Requesting MD: Catha Brow PCP:  Raina Mina., MD   Chief Complaint:  Weight loss, stool incontience, LFT elevation  Luiz Iron for Consult: Possible Choledocholithiasis   HPI:  78 Y/O male transferred from [place] with RUQ abdominal pain, weight loss, stool incontinence and elevated LFT's.  He was sent to the ED by his PCP.  He has been having issues for about 3 months, but would not go to get an ultrasound recommended by his PCP.  He reports loose stools about 30 minutes after eating with 15- 20 pound weight loss over the last 3-4 weeks.  He notes symptoms are worse with some occasional pain, He has had N/V  Work up in the ED showed elevated LFT's.  Ultrasound shows: sludge with small stone and ductal prominence.  Largest stone 4 mm.  Suspicion for choledocholithiasis with no cholecystitis.  WBC 7.6, Hbg 12.7, T Bil 2.5, ALT167, AST 247, alk phos 292 Because of his multiple medical issues he was sent to [facility]  For further evaluation.  Labs here show the AST 167, ALT 116, T Bil 2.8Alk phos 261 INR was elevated 3.7. in [place] yesterday down to 2.3 this AM. WBC 8.5 H/H 11.7/38.5 today  Dr Paulita Fujita has recommended we follow patient and plan Lap Chole with IOC .  Addition medical issues include history significant of diastolic dysfunction CHF, bilateral pulmonary emboli status post Coumadin therapy ongoing, nonobstructive coronary artery disease status post cardiac cath in 2014, hypertension, diabetes, hyperlipidemia, prostate disease, GERD   We are ask to see, possible lap chole with IOC.  ERCP if needed after Lap chole   ROS: Review of Systems  Constitutional: Positive for weight loss (15-20 pounds last 3-4 weeks). Negative for chills, diaphoresis, fever and malaise/fatigue.  HENT: Positive for hearing loss (he has a hearing deficit, both ears).   Eyes:       Just had his cataracts fixed about 3 months  ago. Good distance vision, decreased near vision.  Respiratory: Negative.   Cardiovascular: Positive for leg swelling (mild both legs).  Gastrointestinal: Positive for diarrhea, heartburn, nausea and vomiting.  Genitourinary: Positive for urgency.  Musculoskeletal: Positive for back pain (chronic back pain has a nerve stimulator, he says has never worked.  ).       Also reports some calf pain walking, some mild swelling both ankles, Bilateral neuropathy from diabetes  Skin: Negative for itching and rash.       A lot of brusing since he has been on coumadin  Neurological: Negative.        He says he is not as steady as before and "knocks around," a lot causing brusing  Endo/Heme/Allergies: Negative for environmental allergies and polydipsia. Bruises/bleeds easily.  Psychiatric/Behavioral: Negative.     History reviewed. No pertinent family history.  History reviewed. No pertinent past medical history.  Past Surgical History:  Procedure Laterality Date  . CARDIAC CATHETERIZATION      Social History:  reports that he quit smoking about 33 years ago. He started smoking about 43 years ago. He has a 2.50 pack-year smoking history. He has never used smokeless tobacco. He reports that he does not drink alcohol or use drugs.  Allergies:  Allergies  Allergen Reactions  . Statins     Other reaction(s): Cramps (ALLERGY/intolerance)  . Allopurinol Rash    Unknown  . Doxycycline Rash    Medications Prior to Admission  Medication  Sig Dispense Refill  . Colchicine 0.6 MG CAPS Take 0.6 mg by mouth daily.    . DULoxetine (CYMBALTA) 30 MG capsule Take 30 mg by mouth daily.    . febuxostat (ULORIC) 40 MG tablet Take 40 mg by mouth daily.    . fenofibrate 160 MG tablet Take 160 mg by mouth daily.    . finasteride (PROSCAR) 5 MG tablet Take 5 mg by mouth daily.    . fosinopril (MONOPRIL) 40 MG tablet Take 40 mg by mouth daily.    . furosemide (LASIX) 20 MG tablet Take 20 mg by mouth daily.     Marland Kitchen glipiZIDE (GLUCOTROL XL) 5 MG 24 hr tablet Take 5 mg by mouth daily.    Marland Kitchen omeprazole (PRILOSEC) 10 MG capsule Take 10 mg by mouth daily.    Marland Kitchen oxyCODONE-acetaminophen (PERCOCET) 10-325 MG tablet Take 1 tablet by mouth 2 (two) times a day.    . tamsulosin (FLOMAX) 0.4 MG CAPS capsule Take 0.4 mg by mouth daily.    Marland Kitchen warfarin (COUMADIN) 5 MG tablet Take 5 mg by mouth daily.     . sodium chloride 75 mL/hr at 12/29/18 0600   Anti-infectives (From admission, onward)   None      Blood pressure 139/81, pulse 60, temperature 97.8 F (36.6 C), temperature source Oral, resp. rate 18, height '6\' 4"'  (1.93 m), weight 99.8 kg, SpO2 93 %. Physical Exam: Physical Exam Constitutional:      General: He is not in acute distress.    Appearance: Normal appearance. He is normal weight. He is not ill-appearing, toxic-appearing or diaphoretic.  HENT:     Head: Normocephalic and atraumatic.     Nose: No congestion or rhinorrhea.     Mouth/Throat:     Mouth: Mucous membranes are moist.     Pharynx: Oropharynx is clear. No oropharyngeal exudate or posterior oropharyngeal erythema.  Eyes:     General: No scleral icterus.       Right eye: No discharge.        Left eye: No discharge.     Conjunctiva/sclera: Conjunctivae normal.     Comments: Pupils are equal  Neck:     Musculoskeletal: No neck rigidity.     Vascular: No carotid bruit.  Cardiovascular:     Rate and Rhythm: Normal rate and regular rhythm.     Heart sounds: Murmur (soft murmur) present.  Pulmonary:     Effort: Pulmonary effort is normal. No respiratory distress.     Breath sounds: Normal breath sounds. No stridor. No wheezing, rhonchi or rales.     Comments: Lumbar stimulator right posterior flank stimulator. Chest:     Chest wall: No tenderness.  Abdominal:     General: Abdomen is flat. Bowel sounds are normal. There is no distension.     Tenderness: There is abdominal tenderness (right side and now RUQ). There is no right CVA  tenderness, left CVA tenderness, guarding or rebound.     Hernia: A hernia (? umbilical hernia) is present.  Musculoskeletal:        General: No swelling, tenderness, deformity or signs of injury.     Right lower leg: Edema (trace) present.     Left lower leg: Edema (trace) present.  Lymphadenopathy:     Cervical: No cervical adenopathy.  Skin:    Coloration: Skin is not jaundiced or pale.     Findings: Bruising present. No erythema, lesion or rash.  Neurological:     General: No focal deficit  present.     Mental Status: He is alert and oriented to person, place, and time.     Cranial Nerves: Cranial nerve deficit (decreased hearing both ears) present.  Psychiatric:        Mood and Affect: Mood normal.        Behavior: Behavior normal.        Thought Content: Thought content normal.        Judgment: Judgment normal.     Results for orders placed or performed during the hospital encounter of 12/28/18 (from the past 48 hour(s))  Surgical PCR screen     Status: None   Collection Time: 12/29/18 12:01 AM  Result Value Ref Range   MRSA, PCR NEGATIVE NEGATIVE   Staphylococcus aureus NEGATIVE NEGATIVE    Comment: (NOTE) The Xpert SA Assay (FDA approved for NASAL specimens in patients 4 years of age and older), is one component of a comprehensive surveillance program. It is not intended to diagnose infection nor to guide or monitor treatment. Performed at [facility], [place] [address] [place], [place] [address]   Comprehensive metabolic panel     Status: Abnormal   Collection Time: 12/29/18  4:09 AM  Result Value Ref Range   Sodium 139 135 - 145 mmol/L   Potassium 3.7 3.5 - 5.1 mmol/L   Chloride 108 98 - 111 mmol/L   CO2 24 22 - 32 mmol/L   Glucose, Bld 81 70 - 99 mg/dL   BUN 14 8 - 23 mg/dL   Creatinine, Ser 1.00 0.61 - 1.24 mg/dL   Calcium 9.0 8.9 - 10.3 mg/dL   Total Protein 6.3 (L) 6.5 - 8.1 g/dL   Albumin 3.1 (L) 3.5 - 5.0 g/dL   AST 167 (H) 15 - 41  U/L   ALT 116 (H) 0 - 44 U/L   Alkaline Phosphatase 261 (H) 38 - 126 U/L   Total Bilirubin 2.8 (H) 0.3 - 1.2 mg/dL   GFR calc non Af Amer >60 >60 mL/min   GFR calc Af Amer >60 >60 mL/min   Anion gap 7 5 - 15    Comment: Performed at [facility], [place] [address] [place], [place] [address]  CBC     Status: Abnormal   Collection Time: 12/29/18  4:09 AM  Result Value Ref Range   WBC 8.5 4.0 - 10.5 K/uL   RBC 4.13 (L) 4.22 - 5.81 MIL/uL   Hemoglobin 11.7 (L) 13.0 - 17.0 g/dL   HCT 38.5 (L) 39.0 - 52.0 %   MCV 93.2 80.0 - 100.0 fL   MCH 28.3 26.0 - 34.0 pg   MCHC 30.4 30.0 - 36.0 g/dL   RDW 15.9 (H) 11.5 - 15.5 %   Platelets 226 150 - 400 K/uL   nRBC 0.0 0.0 - 0.2 %    Comment: Performed at [facility], [place] [address] [place], [place] [address]  Magnesium     Status: None   Collection Time: 12/29/18  4:09 AM  Result Value Ref Range   Magnesium 2.1 1.7 - 2.4 mg/dL    Comment: Performed at [facility], [place] [address], [place], [place] [address]  Phosphorus     Status: None   Collection Time: 12/29/18  4:09 AM  Result Value Ref Range   Phosphorus 3.0 2.5 - 4.6 mg/dL    Comment: Performed at [facility], [place] [address], [place], [place] [address]  Glucose, capillary  Status: Abnormal   Collection Time: 12/29/18  8:54 AM  Result Value Ref Range   Glucose-Capillary 145 (H) 70 - 99 mg/dL  Protime-INR     Status: Abnormal   Collection Time: 12/29/18  9:41 AM  Result Value Ref Range   Prothrombin Time 25.2 (H) 11.4 - 15.2 seconds   INR 2.3 (H) 0.8 - 1.2    Comment: (NOTE) INR goal varies based on device and disease states. Performed at [facility], [place] [address] [place], [place] [address]   Glucose, capillary     Status: None   Collection Time: 12/29/18 11:49 AM  Result Value Ref Range   Glucose-Capillary 84 70 - 99 mg/dL   No results found. . sodium chloride 75 mL/hr at  12/29/18 0600     Assessment/Plan Diastolic CHF Non obstructive CAD - cardiac cath 2014 Hx of bilateral pulmonary emboli - on warfarin Hx tobacco use/COPD Hypertension Hyperlipidemia GERD BPH    Abdominal pain, nausea and vomiting, elevated LFT's Cholelithiasis, possible choledocholithiasis (9 mm CBD) Weight loss   FEN:IV fluids/NPO UE:AVWU DVT:  INR 2.3 Follow up:  [facility] clinic POC:  Wife Tim Lair - [phone]   Plan:  Dr Alfredia Ferguson will get him cleared for surgery. We will check labs tomorrow.  We would like his LFT's to improve, and his INR to be below 1.7.  Will aim for laparoscopic cholecystectomy with IOC tomorrow, pending surgical clearance, and labs.     Earnstine Regal [facility] Surgery 12/29/2018, 11:50 AM Pager: [phone] Consults: [phone]

## 2018-12-29 NOTE — Progress Notes (Signed)
PROGRESS NOTE    Brandon CloseCramer J Nairn  ZOX:[id number] DOB: [birth year] DOA: 12/28/2018 PCP: Gordan PaymentGrisso, Greg A., MD   Brief Narrative:  HPI per Dr. Earlie LouMohammad Garba on 12/28/2018 The patient Brandon [name] is a 78 y.o. male with medical history significant of diastolic dysfunction CHF, bilateral pulmonary emboli status post Coumadin therapy ongoing, nonobstructive coronary artery disease status post cardiac cath in 2014, hypertension, diabetes, hyperlipidemia, prostate disease, GERD who was seen at [facility] ER with complaint of progressive weight loss over the past 2 months up to 10 pounds, stool incontinence occasionally.  Patient was seen by his PCP and noted elevation in his liver function tests.  Based on that he was sent to the ER for review.  Initial evaluation in the ER including by ultrasound showed elevated liver function tests.  Patient was evaluated using ultrasound that showed choledocholithiasis with multiple gallstones in the gallbladder.  He has positive Murphy sign but no evidence of acute cholecystitis.  Patient sent over for GI evaluation.  He is on chronic Coumadin with elevated INR so unable to do immediate invasive procedure.  Patient still complains of some nausea and vomiting which he has had on and off.  Mainly stimulated by meals.  This has decreased his appetite which is why he lost the weight.  He has no pain at rest.  **Interim History Patient was referred [facility] and is continuing to have fecal incontinence and diarrhea.  LFTs are still elevated and I discussed the case with gastroenterology who recommended MRCP.  Patient however reported that he had a spinal cord stimulator and is unclear if this is compatible with MRI this will be needed to look into but MRIs been ordered just in case.  GI recommended surgical consultation and have them do a lap chole with IOC and then pending that GI would do an ERCP.  Cardiology was also consulted for preoperative clearance due to  general surgery's request.  INR is trending down.  Assessment & Plan:   Principal Problem:   Choledocholithiasis Active Problems:   Chronic diastolic CHF (congestive heart failure) (HCC)   Essential hypertension   COPD with chronic bronchitis (HCC)   Pulmonary embolism on long-term anticoagulation therapy (HCC)   DM (diabetes mellitus) (HCC)   Anticoagulant long-term use   Balance disorder   Benign prostatic hyperplasia with urinary frequency   CKD (chronic kidney disease) stage 3, GFR 30-59 ml/min (HCC)   Class 1 obesity due to excess calories with serious comorbidity and body mass index (BMI) of 30.0 to 30.9 in adult   Diabetic polyneuropathy associated with type 2 diabetes mellitus (HCC)   Gait abnormality   Gastroesophageal reflux disease   Gout   High risk medication use   History of pulmonary embolism  Abdominal Pain from Symptomatic Cholelithaisis and possible Choledocholithiasis associated with Abnormal LFTs and Hyperbilirubinemia -Patient has right upper quadrant abdominal tenderness otherwise asymptomatic at rest.   -We will keep him on clear liquid diet with n.p.o. in the morning.   -GI will be consulted for possible ERCP or MRCP. MRCP ? Able to be done as patient has A spinal Cord Stimulator but has been ordered in case it is compatible  -RUQ U/S done -GI recommending MRCP and possible ERCP but if patient cannot have MRCP then recommending surgical consultation first with intraoperative cholangiogram and then ERCP only if IOC is positive for choledocholithiasis -General surgery consulted for further evaluation and recommending obtaining Cardiac clearance prior to any intervention given his significant heart history -  Timing of cholecystectomy to general surgery pending Cardiac Clearance -Control nausea and vomiting.   -Allow INR to drift down to less than 2 prior to any invasive procedure. Surgery wants INR <1.7 -Repeat CMP in AM and PT-INR  Diarrhea/Fecal Incontinence  associated with Weight Loss and Nausea and Vomiting  -Happens when patient eats and concordant with Abdominal Pain -Has been having Diarrhea for several months -GI Following and   History of pulmonary emboli -Currently on anticoagulation with Warfarin which is Held.   -Patient will be placed on SCD.   -Hold warfarin for now in anticipation of possible invasive procedure from surgical intervention vs. GI ERCP.   -Resume warfarin after procedure.  Hypertension -Currently holding his BP medications including ACE Inibitior and Lasix -Continue to Monitor BP Carefully -BP at goal at 135/78  Diabetes Mellitus Type 2 -Currently Holding Glipizide now that Sliding scale insulin will be initiated. -Continue to Monitor CBG's  HLD -C/w Fenofibrate  BPH -C/w Finasteride and Tamsulosin  History of COPD -Currently not in exacerbation.  Continue home medicine once verified.  Chronic Diastolic CHF -Patient has trace lower extremity edema.   -No evidence of fluid overload at the moment. Currently getting IVF with NS at 75 mL/hr and will continue for now and reassess in AM  -Strict I's/O's and Daily Weights -Continue to Monitor for S/Sx of Volume Overload -Cardiology Consulted for Pre-operative Clearance  Overweight -Estimated body mass index is 26.79 kg/m as calculated from the following:   Height as of this encounter: 6\' 4"  (1.93 m).   Weight as of this encounter: 99.8 kg. -Weight Loss and Dietary Counseling given   DVT prophylaxis: Anticoagulated with Coumadin but currently being held to allow INR to drift down  Code Status: FULL CODE Family Communication: No family present at bedside Disposition Plan: Remain Inpatient for further evaluation and workup  Consultants:   Gastroenterology  General Surgery  Cardiology    Procedures:  None   Antimicrobials:  Anti-infectives (From admission, onward)   Start     Dose/Rate Route Frequency Ordered Stop   12/30/18 0600   cefTRIAXone (ROCEPHIN) 2 g in sodium chloride 0.9 % 100 mL IVPB     2 g 200 mL/hr over 30 Minutes Intravenous On call to O.R. 12/29/18 1409 12/31/18 0559     Subjective: Seen and examined at bedside he is sitting the chair and just come back from the bathroom states that he "messed himself".  Patient is very hard of hearing and complaining of some crampy abdominal pain on the right side.  No nausea or vomiting now.  No chest pain.  Denies any other concerns or complaints at this time.  Objective: Vitals:   12/28/18 2224 12/29/18 0618 12/29/18 1256  BP: 121/85 139/81 135/78  Pulse: 61 60 (!) 59  Resp: 18 18 16   Temp: 98 F (36.7 C) 97.8 F (36.6 C) 98.5 F (36.9 C)  TempSrc: Oral Oral Oral  SpO2: 97% 93% 96%  Weight: 99.8 kg    Height: 6\' 4"  (1.93 m)      Intake/Output Summary (Last 24 hours) at 12/29/2018 1609 Last data filed at 12/29/2018 1425 Gross per 24 hour  Intake 1949.85 ml  Output 1150 ml  Net 799.85 ml   Filed Weights   12/28/18 2224  Weight: 99.8 kg   Examination: Physical Exam:  Constitutional: WN/WD overweight Caucasian male in NAD and appears calm but slightly uncomfortable Eyes: Lids and conjunctivae normal, sclerae anicteric  ENMT: External Ears, Nose appear normal. Extremely  hard of hearing. Mucous membranes are moist. Neck: Appears normal, supple, no cervical masses, normal ROM, no appreciable thyromegaly; no JVD Respiratory: Diminished to auscultation bilaterally, no wheezing, rales, rhonchi or crackles. Cardiovascular: RRR, has a murmur. S1 and S2 auscultated. Trace extremity edema. 2+ pedal pulses. No carotid bruits.  Abdomen: Soft, Tender to palpate on the Right, non-distended. No masses palpated. No appreciable hepatosplenomegaly. Bowel sounds positive x4.  GU: Deferred. Musculoskeletal: No clubbing / cyanosis of digits/nails. No joint deformity upper and lower extremities.  Skin: No rashes, lesions, ulcers on a limited skin examinatin. No  induration; Warm and dry.  Neurologic: CN 2-12 grossly intact with no focal deficits except that he is hard of hearing. Romberg sign and cerebellar reflexes not assessed.  Psychiatric: Normal judgment and insight. Alert and oriented x 3. Anxious mood and appropriate affect.   Data Reviewed: I have personally reviewed following labs and imaging studies  CBC: Recent Labs  Lab 12/29/18 0409  WBC 8.5  HGB 11.7*  HCT 38.5*  MCV 93.2  PLT 226   Basic Metabolic Panel: Recent Labs  Lab 12/29/18 0409  NA 139  K 3.7  CL 108  CO2 24  GLUCOSE 81  BUN 14  CREATININE 1.00  CALCIUM 9.0  MG 2.1  PHOS 3.0   GFR: Estimated Creatinine Clearance: 77.2 mL/min (by C-G formula based on SCr of 1 mg/dL). Liver Function Tests: Recent Labs  Lab 12/29/18 0409  AST 167*  ALT 116*  ALKPHOS 261*  BILITOT 2.8*  PROT 6.3*  ALBUMIN 3.1*   No results for input(s): LIPASE, AMYLASE in the last 168 hours. No results for input(s): AMMONIA in the last 168 hours. Coagulation Profile: Recent Labs  Lab 12/29/18 0941  INR 2.3*   Cardiac Enzymes: No results for input(s): CKTOTAL, CKMB, CKMBINDEX, TROPONINI in the last 168 hours. BNP (last 3 results) No results for input(s): PROBNP in the last 8760 hours. HbA1C: No results for input(s): HGBA1C in the last 72 hours. CBG: Recent Labs  Lab 12/29/18 0854 12/29/18 1149  GLUCAP 145* 84   Lipid Profile: No results for input(s): CHOL, HDL, LDLCALC, TRIG, CHOLHDL, LDLDIRECT in the last 72 hours. Thyroid Function Tests: No results for input(s): TSH, T4TOTAL, FREET4, T3FREE, THYROIDAB in the last 72 hours. Anemia Panel: No results for input(s): VITAMINB12, FOLATE, FERRITIN, TIBC, IRON, RETICCTPCT in the last 72 hours. Sepsis Labs: No results for input(s): PROCALCITON, LATICACIDVEN in the last 168 hours.  Recent Results (from the past 240 hour(s))  Surgical PCR screen     Status: None   Collection Time: 12/29/18 12:01 AM  Result Value Ref Range  Status   MRSA, PCR NEGATIVE NEGATIVE Final   Staphylococcus aureus NEGATIVE NEGATIVE Final    Comment: (NOTE) The Xpert SA Assay (FDA approved for NASAL specimens in patients 28 years of age and older), is one component of a comprehensive surveillance program. It is not intended to diagnose infection nor to guide or monitor treatment. Performed at [facility], [address], [place], [place] [address]     Radiology Studies: No results found.  Scheduled Meds: . feeding supplement  1 Container Oral TID BM  . insulin aspart  0-9 Units Subcutaneous TID WC  . mupirocin ointment  1 application Nasal BID   Continuous Infusions: . sodium chloride 75 mL/hr at 12/29/18 1425  . [START ON 12/30/2018] cefTRIAXone (ROCEPHIN)  IV      LOS: 1 day   Merlene Laughter, DO [facility] PAGER  is on AMION  If 7PM-7AM, please contact night-coverage [email or web address] Password [facility] 12/29/2018, 4:09 PM

## 2018-12-30 ENCOUNTER — Encounter (HOSPITAL_COMMUNITY): Payer: Self-pay | Admitting: Student

## 2018-12-30 DIAGNOSIS — Z7901 Long term (current) use of anticoagulants: Secondary | ICD-10-CM

## 2018-12-30 DIAGNOSIS — I2510 Atherosclerotic heart disease of native coronary artery without angina pectoris: Secondary | ICD-10-CM

## 2018-12-30 DIAGNOSIS — Z01810 Encounter for preprocedural cardiovascular examination: Secondary | ICD-10-CM

## 2018-12-30 LAB — CBC WITH DIFFERENTIAL/PLATELET
Abs Immature Granulocytes: 0.05 10*3/uL (ref 0.00–0.07)
Basophils Absolute: 0.1 10*3/uL (ref 0.0–0.1)
Basophils Relative: 1 %
Eosinophils Absolute: 0.3 10*3/uL (ref 0.0–0.5)
Eosinophils Relative: 4 %
HCT: 39.7 % (ref 39.0–52.0)
Hemoglobin: 12.1 g/dL — ABNORMAL LOW (ref 13.0–17.0)
Immature Granulocytes: 1 %
Lymphocytes Relative: 31 %
Lymphs Abs: 2.3 10*3/uL (ref 0.7–4.0)
MCH: 28.5 pg (ref 26.0–34.0)
MCHC: 30.5 g/dL (ref 30.0–36.0)
MCV: 93.6 fL (ref 80.0–100.0)
Monocytes Absolute: 0.9 10*3/uL (ref 0.1–1.0)
Monocytes Relative: 13 %
Neutro Abs: 3.8 10*3/uL (ref 1.7–7.7)
Neutrophils Relative %: 50 %
Platelets: 225 10*3/uL (ref 150–400)
RBC: 4.24 MIL/uL (ref 4.22–5.81)
RDW: 15.7 % — ABNORMAL HIGH (ref 11.5–15.5)
WBC: 7.5 10*3/uL (ref 4.0–10.5)
nRBC: 0 % (ref 0.0–0.2)

## 2018-12-30 LAB — COMPREHENSIVE METABOLIC PANEL
ALT: 83 U/L — ABNORMAL HIGH (ref 0–44)
AST: 72 U/L — ABNORMAL HIGH (ref 15–41)
Albumin: 3 g/dL — ABNORMAL LOW (ref 3.5–5.0)
Alkaline Phosphatase: 234 U/L — ABNORMAL HIGH (ref 38–126)
Anion gap: 7 (ref 5–15)
BUN: 15 mg/dL (ref 8–23)
CO2: 22 mmol/L (ref 22–32)
Calcium: 8.8 mg/dL — ABNORMAL LOW (ref 8.9–10.3)
Chloride: 109 mmol/L (ref 98–111)
Creatinine, Ser: 1.03 mg/dL (ref 0.61–1.24)
GFR calc Af Amer: >60 mL/min (ref >60)
GFR calc non Af Amer: >60 mL/min (ref >60)
Glucose, Bld: 90 mg/dL (ref 70–99)
Potassium: 3.7 mmol/L (ref 3.5–5.1)
Sodium: 138 mmol/L (ref 135–145)
Total Bilirubin: 1.3 mg/dL — ABNORMAL HIGH (ref 0.3–1.2)
Total Protein: 6.4 g/dL — ABNORMAL LOW (ref 6.5–8.1)

## 2018-12-30 LAB — GLUCOSE, CAPILLARY
Glucose-Capillary: 90 mg/dL (ref 70–99)
Glucose-Capillary: 131 mg/dL — ABNORMAL HIGH (ref 70–99)
Glucose-Capillary: 125 mg/dL — ABNORMAL HIGH (ref 70–99)
Glucose-Capillary: 101 mg/dL — ABNORMAL HIGH (ref 70–99)

## 2018-12-30 LAB — LIPASE, BLOOD: Lipase: 25 U/L (ref 11–51)

## 2018-12-30 LAB — PROTIME-INR
INR: 1.9 — ABNORMAL HIGH (ref 0.8–1.2)
Prothrombin Time: 21.2 seconds — ABNORMAL HIGH (ref 11.4–15.2)

## 2018-12-30 LAB — PHOSPHORUS: Phosphorus: 3.2 mg/dL (ref 2.5–4.6)

## 2018-12-30 LAB — MAGNESIUM: Magnesium: 2.1 mg/dL (ref 1.7–2.4)

## 2018-12-30 MED ORDER — ENSURE ENLIVE PO LIQD
237.0000 mL | ORAL | Status: DC
  Administered 2018-12-31 – 2019-01-01 (×2): 237 mL via ORAL

## 2018-12-30 MED ORDER — ADULT MULTIVITAMIN W/MINERALS CH
1.0000 | ORAL_TABLET | Freq: Every day | ORAL | Status: DC
  Administered 2018-12-30 – 2019-01-02 (×4): 1 via ORAL
  Filled 2018-12-30 (×3): qty 1

## 2018-12-30 NOTE — Progress Notes (Signed)
  CC:Weight loss, stool incontience, LFT elevation   Subjective: He feels about the same, still has loose stools even with clear liquids.  I will add an lipase to AM labs.  He is upset we cannot do today, but understands.  I talked with his wife and she is aware of plan and understands  Objective: Vital signs in last 24 hours: Temp:  [98.1 F (36.7 C)-98.5 F (36.9 C)] 98.1 F (36.7 C) (04/29 0629) Pulse Rate:  [58-62] 62 (04/29 0629) Resp:  [16-18] 18 (04/29 0629) BP: (132-135)/(78-81) 132/81 (04/29 0629) SpO2:  [95 %-96 %] 95 % (04/29 0629) Last BM Date: 12/29/18 960 PO 1683 IV 1950 urine BM x 2 Afebrile, VSS CMP improving WBC is normal INR 1.9 CMP Latest Ref Rng & Units 12/30/2018 12/29/2018  Total Bilirubin 0.3 - 1.2 mg/dL 1.3(H) 2.8(H)  Alkaline Phos 38 - 126 U/L 234(H) 261(H)  AST 15 - 41 U/L 72(H) 167(H)  ALT 0 - 44 U/L 83(H) 116(H)   Intake/Output from previous day: 04/28 0701 - 04/29 0700 In: 2643.6 [P.O.:960; I.V.:1683.6] Out: 1950 [Urine:1950] Intake/Output this shift: No intake/output data recorded.  General appearance: alert, cooperative and no distress Resp: clear to auscultation bilaterally GI: soft still a bit tender RUQ.   Lab Results:  Recent Labs    12/29/18 0409 12/30/18 0409  WBC 8.5 7.5  HGB 11.7* 12.1*  HCT 38.5* 39.7  PLT 226 225    BMET Recent Labs    12/29/18 0409 12/30/18 0409  NA 139 138  K 3.7 3.7  CL 108 109  CO2 24 22  GLUCOSE 81 90  BUN 14 15  CREATININE 1.00 1.03  CALCIUM 9.0 8.8*   PT/INR Recent Labs    12/29/18 0941 12/30/18 0409  LABPROT 25.2* 21.2*  INR 2.3* 1.9*    Recent Labs  Lab 12/29/18 0409 12/30/18 0409  AST 167* 72*  ALT 116* 83*  ALKPHOS 261* 234*  BILITOT 2.8* 1.3*  PROT 6.3* 6.4*  ALBUMIN 3.1* 3.0*     Lipase  No results found for: LIPASE   Medications: . colchicine  0.6 mg Oral Daily  . DULoxetine  30 mg Oral Daily  . febuxostat  40 mg Oral Daily  . feeding supplement   1 Container Oral TID BM  . fenofibrate  160 mg Oral Daily  . finasteride  5 mg Oral Daily  . insulin aspart  0-9 Units Subcutaneous TID WC  . mupirocin ointment  1 application Nasal BID  . oxyCODONE-acetaminophen  1 tablet Oral BID   And  . oxyCODONE  5 mg Oral BID  . pantoprazole  40 mg Oral Daily  . tamsulosin  0.4 mg Oral Daily    Assessment/Plan Diastolic CHF Non obstructive CAD - cardiac cath 2014 Hx of bilateral pulmonary emboli - on warfarin Hx tobacco use/COPD Hypertension Hyperlipidemia GERD BPH  Abdominal pain, nausea and vomiting, elevated LFT's Cholelithiasis, possible choledocholithiasis (9 mm CBD) Weight loss  FEN:IV fluids/NPO ID:None DVT:  INR 1.9 Follow up:  [facility] POC:  Wife Brandon [name]- [phone]   Plan:  We will hold off surgery today and let INR come down more ( we want it at 1.7, or lower);  give Medicine team some additional time to complete surgical clearance. I discussed with Dr. Sheikh last PM.  Recheck labs in AM. I wanted to leave him on just liquids, but he wants some real food.  So I will give him a low fat heart healthy diet   for today. NPO after MN.       LOS: 2 days    Brandon [name] 12/30/2018 [phone]

## 2018-12-30 NOTE — Consult Note (Signed)
Cardiology Consult    Patient ID: Brandon [name] MRN: [id number], DOB/AGE: 12-07-1940   Admit date: 4/27/2020Date of Consult: 12/30/2018  Primary Physician: Gordan Payment., MD Primary Cardiologist: New to [facility] Requesting Provider: Dr. Michaell Cowing and Dr. Marland Mcalpine  Patient Profile    Brandon [name] is a 78 y.o. 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 Michaell Cowing (General Surgery) and Dr. Marland Mcalpine (Internal Medicine).  History of Present Illness    Mr. Brandon [name] is a 78 year old male with the above history. Patient presented to the [facility] ED for evaluation of progressive weight loss over the last 2 months and occasional stool incontinence. He was initially seen by his PCP for this and was found to have elevated liver function tests so he was advised to go to the ED for further work-up. In the ED, ultrasound reportedly showed choledocholithiasis with multiple gallstones in the gallbladder. He had a positive Murphy's sign but no evidence of acute cholecystitis. Therefore, he was transferred to [facility] for GI evaluation. GI and General Surgery were consulted and plan is for laparoscopic cholecystectomy with IOC. Cardiology was consulted for pre-operative evaluation.  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 Patient remembers having a cardiac catheterization several years ago and being told his heart is fine. Patient states he had this done in [place]. No records in our system or in Care Everywhere.  Patient states he has never been told he has congestive heart failure. He does not follow with a Cardiologist. Patient  denies any cardiac symptoms including chest pain, shortness of breath, palpitations, orthopnea, or PND. He notes some mild lower extremity swelling if he has been sitting down all day. He also states he feels like he has had some balance issues lately but denies any significant lightheadedness/dizziness and no syncope. Patient has not been very active over the last month due to all the trouble with his gallbladder. He has had significant abdominal pain, diarrhea/loose stools after eating, nausea (and one episode of vomiting), and a 15 lb weight loss over about the last month. However, patient's functional status is adequate and he is able to perform all activities of daily living, walk inside, walk up a flight of steps (if he takes his time), and do light/moderate house work.   Patient is a former smoker but quit around 1987 to 1988. He reports that his father had heart surgery in his 1's and thinks he may have had blockages in his heart but is not entirely sure. He denies any other known family history of heart disease.   Past Medical History   History reviewed. No pertinent past medical history.  Past Surgical History:  Procedure Laterality Date  . CARDIAC CATHETERIZATION       Allergies  Allergies  Allergen Reactions  . Statins     Other reaction(s): Cramps (ALLERGY/intolerance)  . Allopurinol Rash    Unknown  . Doxycycline Rash    Inpatient Medications    . colchicine  0.6 mg Oral Daily  . DULoxetine  30 mg Oral Daily  . febuxostat  40 mg Oral Daily  . feeding supplement  1 Container Oral TID BM  . fenofibrate  160 mg Oral Daily  . finasteride  5 mg Oral Daily  . insulin aspart  0-9 Units Subcutaneous TID WC  . mupirocin ointment  1 application Nasal BID  . oxyCODONE-acetaminophen  1 tablet Oral BID   And  . oxyCODONE  5 mg Oral BID  . pantoprazole  40 mg Oral Daily  . tamsulosin  0.4 mg Oral Daily    Family History    Family History  Problem Relation Age of Onset  .  Heart disease Father        Had heart surgery in his 6180's   He indicated that his mother is deceased. He indicated that his father is deceased.   Social History    Social History   Socioeconomic History  . Marital status: Married    Spouse name: Brandon [name]  . Number of children: 1  . Years of education: 5612  . Highest education level: 12th grade  Occupational History  . Not on file  Social Needs  . Financial resource strain: Not very hard  . Food insecurity:    Worry: Never true    Inability: Never true  . Transportation needs:    Medical: No    Non-medical: No  Tobacco Use  . Smoking status: Former Smoker    Packs/day: 0.25    Years: 10.00    Pack years: 2.50    Start date: 1977    Last attempt to quit: 1987    Years since quitting: 33.3  . Smokeless tobacco: Never Used  . Tobacco comment: declined  Substance and Sexual Activity  . Alcohol use: Never    Frequency: Never  . Drug use: Never  . Sexual activity: Not Currently  Lifestyle  . Physical activity:    Days per week: 0 days    Minutes per session: 0 min  . Stress: Only a little  Relationships  . Social connections:    Talks on phone: Three times a week    Gets together: Never    Attends religious service: 1 to 4 times per year    Active member of club or organization: No    Attends meetings of clubs or organizations: Never    Relationship status: Married  . Intimate partner violence:    Fear of current or ex partner: No    Emotionally abused: No    Physically abused: No    Forced sexual activity: No  Other Topics Concern  . 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  Does not go out much.      Review of Systems    Review of Systems  Constitutional: Positive for weight loss.  Respiratory: Negative for shortness of breath.   Cardiovascular: Positive for leg swelling. Negative for chest pain, palpitations, orthopnea and PND.  Gastrointestinal: Positive for abdominal  pain, diarrhea, nausea and vomiting. Negative for blood in stool.  Genitourinary: Negative for hematuria.  Neurological: Negative for loss of consciousness.  Endo/Heme/Allergies: Does not bruise/bleed easily.  Psychiatric/Behavioral: Negative for substance abuse.  All other systems reviewed and are negative.   Physical Exam    Blood pressure 132/81, pulse 62, temperature 98.1 F (36.7 C), temperature source Oral, resp. rate 18, height 6\' 4"  (1.93 m), weight 99.8 kg, SpO2 95 %.   Given current COVID-19 pandemic, I called into patient's room to obtain a history and did not physically examine the patient. However, patient was alert and oriented. He did not sound like he was  in distress and was able to speak in complete sentences without any labored breathing.   MD to follow with exam.    Labs    Troponin (Point of Care Test) No results for input(s): TROPIPOC in the last 72 hours. No results for input(s): CKTOTAL, CKMB, TROPONINI in the last 72 hours. Lab Results  Component Value Date   WBC 7.5 12/30/2018   HGB 12.1 (L) 12/30/2018   HCT 39.7 12/30/2018   MCV 93.6 12/30/2018   PLT 225 12/30/2018    Recent Labs  Lab 12/30/18 0409  NA 138  K 3.7  CL 109  CO2 22  BUN 15  CREATININE 1.03  CALCIUM 8.8*  PROT 6.4*  BILITOT 1.3*  ALKPHOS 234*  ALT 83*  AST 72*  GLUCOSE 90   No results found for: CHOL, HDL, LDLCALC, TRIG No results found for: [facility]   Radiology Studies    No results found.  EKG     EKG: EKG was personally reviewed and demonstrates: No EKGs in the system. Telemetry: Telemetry was personally reviewed and demonstrates: Patient not on telemetry.  Cardiac Imaging    None.  Assessment & Plan    Preoperative Evaluation - Patient presented with progressive weight loss and stool incontinence. He was noted to have elevated liver function tests and then found to have cholelithiasis and possible choledocholithiasis. General Surgery is planning for  laparoscopic cholecystectomy and IOC once INR is < 1.7. Cardiology was consulted for pre-operative evaluation. 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 However, no records available for this. Patient does not remember every being told he has CHF. Patient denies any chest pain, shortness of breath, orthopnea, PND, palpitations, or syncope. He has some mild lower extremity edema if he has been sitting all day.  - Will check EKG.  -  Per the Revised Cardiac Risk Index, considered  low risk with a 0.9% risk of cardiac complications. - Functional status > 4.0 METS. He is able to perform all activities of daily living, walk inside, walk up a flight of steps (if he takes his time), and do light/moderate house work (carry in groceries, vacuum, mow the lawn).  - Do not anticipate any additional cardiac work-up prior to surgery. MD to follow.   Otherwise, per primary team: - Cholelithiasis - History of Pulmonary Emboli, on Coumadin - Hypertension - Hyperlipiemia - Diabetes Mellitus   Signed, Corrin Parker, PA-C 12/30/2018, 8:42 AM Pager: [phone] For questions or updates, please contact   Please consult [email or web address] for contact info under Cardiology/STEMI.

## 2018-12-30 NOTE — H&P (View-Only) (Signed)
ZO:[id number]:Weight loss, stool incontience, LFT elevation   Subjective: He feels about the same, still has loose stools even with clear liquids.  I will add an lipase to AM labs.  He is upset we cannot do today, but understands.  I talked with his wife and she is aware of plan and understands  Objective: Vital signs in last 24 hours: Temp:  [98.1 F (36.7 C)-98.5 F (36.9 C)] 98.1 F (36.7 C) (04/29 0629) Pulse Rate:  [58-62] 62 (04/29 0629) Resp:  [16-18] 18 (04/29 0629) BP: (132-135)/(78-81) 132/81 (04/29 0629) SpO2:  [95 %-96 %] 95 % (04/29 0629) Last BM Date: 12/29/18 960 PO 1683 IV 1950 urine BM x 2 Afebrile, VSS CMP improving WBC is normal INR 1.9 CMP Latest Ref Rng & Units 12/30/2018 12/29/2018  Total Bilirubin 0.3 - 1.2 mg/dL 5.4(U1.3(H) 2.8(H)  Alkaline Phos 38 - 126 U/L 234(H) 261(H)  AST 15 - 41 U/L 72(H) 167(H)  ALT 0 - 44 U/L 83(H) 116(H)   Intake/Output from previous day: 04/28 0701 - 04/29 0700 In: 2643.6 [P.O.:960; I.V.:1683.6] Out: 1950 [Urine:1950] Intake/Output this shift: No intake/output data recorded.  General appearance: alert, cooperative and no distress Resp: clear to auscultation bilaterally GI: soft still a bit tender RUQ.   Lab Results:  Recent Labs    12/29/18 0409 12/30/18 0409  WBC 8.5 7.5  HGB 11.7* 12.1*  HCT 38.5* 39.7  PLT 226 225    BMET Recent Labs    12/29/18 0409 12/30/18 0409  NA 139 138  K 3.7 3.7  CL 108 109  CO2 24 22  GLUCOSE 81 90  BUN 14 15  CREATININE 1.00 1.03  CALCIUM 9.0 8.8*   PT/INR Recent Labs    12/29/18 0941 12/30/18 0409  LABPROT 25.2* 21.2*  INR 2.3* 1.9*    Recent Labs  Lab 12/29/18 0409 12/30/18 0409  AST 167* 72*  ALT 116* 83*  ALKPHOS 261* 234*  BILITOT 2.8* 1.3*  PROT 6.3* 6.4*  ALBUMIN 3.1* 3.0*     Lipase  No results found for: LIPASE   Medications: . colchicine  0.6 mg Oral Daily  . DULoxetine  30 mg Oral Daily  . febuxostat  40 mg Oral Daily  . feeding supplement   1 Container Oral TID BM  . fenofibrate  160 mg Oral Daily  . finasteride  5 mg Oral Daily  . insulin aspart  0-9 Units Subcutaneous TID WC  . mupirocin ointment  1 application Nasal BID  . oxyCODONE-acetaminophen  1 tablet Oral BID   And  . oxyCODONE  5 mg Oral BID  . pantoprazole  40 mg Oral Daily  . tamsulosin  0.4 mg Oral Daily    Assessment/Plan Diastolic CHF Non obstructive CAD - cardiac cath 2014 Hx of bilateral pulmonary emboli - on warfarin Hx tobacco use/COPD Hypertension Hyperlipidemia GERD BPH  Abdominal pain, nausea and vomiting, elevated LFT's Cholelithiasis, possible choledocholithiasis (9 mm CBD) Weight loss  FEN:IV fluids/NPO JW:JXBJ:None DVT:  INR 1.9 [facility]:  Wife Darci CurrentWanda [phone]   Plan:  We will hold off surgery today and let INR come down more ( we want it at 1.7, or lower);  give Medicine team some additional time to complete surgical clearance. I discussed with Dr. Marland McalpineSheikh last PM.  Recheck labs in AM. I wanted to leave him on just liquids, but he wants some real food.  So I will give him a low fat heart healthy diet  for today. NPO after MN.       LOS: 2 days    Pharrell Ledford 12/30/2018 [phone]

## 2018-12-30 NOTE — Progress Notes (Signed)
LFTs downtrending.  Surgical team planning cholecystectomy + IOC likely tomorrow pending INR results.  ERCP this admission if choledocholithiasis seen on IOC.  [facility] will follow at a distance pending IOC findings.

## 2018-12-30 NOTE — Progress Notes (Signed)
PROGRESS NOTE    Brandon CloseCramer J Pembleton  ZOX:[id number] DOB: 1941-08-30 DOA: 12/28/2018 PCP: Gordan PaymentGrisso, Greg A., MD   Brief Narrative:  HPI On 12/28/2018 by Dr. Earlie LouMohammad Garba Brandon [name] is a 78 y.o. male with medical history significant of diastolic dysfunction CHF, bilateral pulmonary emboli status post Coumadin therapy ongoing, nonobstructive coronary artery disease status post cardiac cath in 2014, hypertension, diabetes, hyperlipidemia, prostate disease, GERD who [facility] with complaint of progressive weight loss over the past 2 months up to 10 pounds, stool incontinence occasionally.  Patient was seen by his PCP and noted elevation in his liver function tests.  Based on that he was sent to the ER for review.  Initial evaluation in the ER including by ultrasound showed elevated liver function tests.  Patient was evaluated using ultrasound that showed choledocholithiasis with multiple gallstones in the gallbladder.  He has positive Murphy sign but no evidence of acute cholecystitis.  Patient sent over for GI evaluation.  He is on chronic Coumadin with elevated INR so unable to do immediate invasive procedure.  Patient still complains of some nausea and vomiting which he has had on and off.  Mainly stimulated by meals.  This has decreased his appetite which is why he lost the weight.  He has no pain at rest..  Assessment & Plan   Abdominal pain from symptomatic cholelithiasis, possible choledocholithiasis/normal LFTs and hyperbilirubinemia -Patient presented with right upper quadrant abdominal tenderness -GI consulted and appreciated, recommended MRCP (however patient has spinal cord stimulator) -General surgery consulted and appreciated, planning for cholecystectomy plus IOC on 12/31/2018 however would like cardiology clearance prior to surgery -INR currently 1.9  Diarrhea/fecal incontinence associated with weight loss and nausea, vomiting -GI following -Has been ongoing  for several months  History of pulmonary emboli -Coumadin held  Essential hypertension -Hold ACEI and lasix  Hyperlipidemia  -continue fenofibrate  BPH -Continue finasteride and tamsulosin  Chronic diastolic CHF  -Currently euvolemic  -trace lower extremity edema -Cardiology consulted and appreciated for pre-operative assessment- patient low risk, no further testing needed. Restart coumadin after surgery and follow up with PCP  Diabetes mellitus, type II -Holding glipizide -Continue insulin sliding scale and CBG monitoring   DVT Prophylaxis  Coumadin held, SCDs  Code Status: Full  Family Communication: None at bedside  Disposition Plan: Admitted. Pending surgery on 12/31/2018. Dispo pending  Consultants General surgery Gastroenterology Cardiology  Procedures  None  Antibiotics   Anti-infectives (From admission, onward)   Start     Dose/Rate Route Frequency Ordered Stop   12/30/18 0600  cefTRIAXone (ROCEPHIN) 2 g in sodium chloride 0.9 % 100 mL IVPB     2 g 200 mL/hr over 30 Minutes Intravenous On call to O.R. 12/29/18 1409 12/31/18 0559      Subjective:   Brandon [name] seen and examined today.  Patient continues to have some abdominal pain.  Some nausea, vomiting. Would like to have the surgery done and over with.  Denies current chest pain, shortness of breath, constipation, dizziness or headache.  Objective:   Vitals:   12/29/18 2141 12/30/18 0629 12/30/18 1219 12/30/18 1423  BP: 135/79 132/81  128/78  Pulse: (!) 58 62  66  Resp: 18 18  18   Temp: 98.4 F (36.9 C) 98.1 F (36.7 C)  98.2 F (36.8 C)  TempSrc: Oral Oral  Oral  SpO2: 96% 95% 95% 100%  Weight:      Height:        Intake/Output  Summary (Last 24 hours) at 12/30/2018 1630 Last data filed at 12/30/2018 1400 Gross per 24 hour  Intake 2518.63 ml  Output 1300 ml  Net 1218.63 ml   Filed Weights   12/28/18 2224  Weight: 99.8 kg    Exam  General: Well developed, well nourished,  NAD, appears stated age  HEENT: NCAT, mucous membranes moist.   Neck: Supple  Cardiovascular: S1 S2 auscultated, RRR, soft SEM  Respiratory: Clear to auscultation bilaterally with equal chest rise  Abdomen: Soft, nontender, nondistended, + bowel sounds  Extremities: warm dry without cyanosis clubbing or edema  Neuro: AAOx3, nonfocal  Psych: Appropriate mood and affect, pleasant    Data Reviewed: I have personally reviewed following labs and imaging studies  CBC: Recent Labs  Lab 12/29/18 0409 12/30/18 0409  WBC 8.5 7.5  NEUTROABS  --  3.8  HGB 11.7* 12.1*  HCT 38.5* 39.7  MCV 93.2 93.6  PLT 226 225   Basic Metabolic Panel: Recent Labs  Lab 12/29/18 0409 12/30/18 0409  NA 139 138  K 3.7 3.7  CL 108 109  CO2 24 22  GLUCOSE 81 90  BUN 14 15  CREATININE 1.00 1.03  CALCIUM 9.0 8.8*  MG 2.1 2.1  PHOS 3.0 3.2   GFR: Estimated Creatinine Clearance: 74.9 mL/min (by C-G formula based on SCr of 1.03 mg/dL). Liver Function Tests: Recent Labs  Lab 12/29/18 0409 12/30/18 0409  AST 167* 72*  ALT 116* 83*  ALKPHOS 261* 234*  BILITOT 2.8* 1.3*  PROT 6.3* 6.4*  ALBUMIN 3.1* 3.0*   Recent Labs  Lab 12/30/18 0409  LIPASE 25   No results for input(s): AMMONIA in the last 168 hours. Coagulation Profile: Recent Labs  Lab 12/29/18 0941 12/30/18 0409  INR 2.3* 1.9*   Cardiac Enzymes: No results for input(s): CKTOTAL, CKMB, CKMBINDEX, TROPONINI in the last 168 hours. BNP (last 3 results) No results for input(s): PROBNP in the last 8760 hours. HbA1C: No results for input(s): HGBA1C in the last 72 hours. CBG: Recent Labs  Lab 12/29/18 1149 12/29/18 1656 12/29/18 1942 12/30/18 0747 12/30/18 1142  GLUCAP 84 86 108* 90 131*   Lipid Profile: No results for input(s): CHOL, HDL, LDLCALC, TRIG, CHOLHDL, LDLDIRECT in the last 72 hours. Thyroid Function Tests: No results for input(s): TSH, T4TOTAL, FREET4, T3FREE, THYROIDAB in the last 72 hours. Anemia  Panel: No results for input(s): VITAMINB12, FOLATE, FERRITIN, TIBC, IRON, RETICCTPCT in the last 72 hours. Urine analysis: No results found for: COLORURINE, APPEARANCEUR, LABSPEC, PHURINE, GLUCOSEU, HGBUR, BILIRUBINUR, KETONESUR, PROTEINUR, UROBILINOGEN, NITRITE, LEUKOCYTESUR Sepsis Labs: @LABRCNTIP (procalcitonin:4,lacticidven:4)  ) Recent Results (from the past 240 hour(s))  Surgical PCR screen     Status: None   Collection Time: 12/29/18 12:01 AM  Result Value Ref Range Status   MRSA, PCR NEGATIVE NEGATIVE Final   Staphylococcus aureus NEGATIVE NEGATIVE Final    Comment: (NOTE) The Xpert SA Assay (FDA approved for NASAL specimens in patients 17 years of age and older), is one component of a comprehensive surveillance program. It is not intended to diagnose infection nor to guide or monitor treatment. Performed at [facility], [address], [place], [place] [address]       Radiology Studies: No results found.   Scheduled Meds: . colchicine  0.6 mg Oral Daily  . DULoxetine  30 mg Oral Daily  . febuxostat  40 mg Oral Daily  . feeding supplement  1 Container Oral TID BM  . feeding supplement (ENSURE ENLIVE)  237 mL Oral Q24H  . fenofibrate  160 mg Oral Daily  . finasteride  5 mg Oral Daily  . insulin aspart  0-9 Units Subcutaneous TID WC  . multivitamin with minerals  1 tablet Oral Daily  . mupirocin ointment  1 application Nasal BID  . oxyCODONE-acetaminophen  1 tablet Oral BID   And  . oxyCODONE  5 mg Oral BID  . pantoprazole  40 mg Oral Daily  . tamsulosin  0.4 mg Oral Daily   Continuous Infusions: . sodium chloride 75 mL/hr at 12/30/18 0438  . cefTRIAXone (ROCEPHIN)  IV       LOS: 2 days   Time Spent in minutes   30 minutes  Jackye Dever D.O. on 12/30/2018 at 4:30 PM  Between 7am to 7pm - Please see pager noted on [email or web address]  After 7pm go to [email or web address]  And look for the night coverage person covering for me after hours  [facility] Office  [phone]

## 2018-12-30 NOTE — Progress Notes (Signed)
Initial Nutrition Assessment  RD working remotely.   DOCUMENTATION CODES:   (unable to assess for malnutrition at this time)  INTERVENTION:  - continue Boost Breeze TID, each supplement provides 250 kcal and 9 grams of protein. - will order Ensure Enlive once/day, each supplement provides 350 kcal and 20 grams of protein. - will order daily multivitamin with minerals. - continue to encourage PO intakes.    NUTRITION DIAGNOSIS:   Unintentional weight loss related to acute illness, nausea, vomiting, decreased appetite as evidenced by per patient/family report, percent weight loss.  GOAL:   Patient will meet greater than or equal to 90% of their needs  MONITOR:   PO intake, Supplement acceptance, Labs, Weight trends  REASON FOR ASSESSMENT:   Malnutrition Screening Tool  ASSESSMENT:   78 y.o. male with medical history significant of CHF, bilateral pulmonary emboli, non-obstructive CAD s/p cardiac cath in 2014, HTN, DM, hyperlipidemia, prostate disease, and GERD. Patient presented to [facility] ED with complaint of progressive weight loss over the past 2 months, stool incontinence occasionally. Patient was seen by PCP and noted elevation in his LFTs. Abdominal ultrasound in the ED showed choledocholithiasis with multiple gallstones in the gallbladder.    BMI indicates overweight status. Diet advanced from NPO to CLD yesterday at 2 PM and then to Heart Healthy today at 8 AM. He was able to tolerate 100% of clear liquid breakfast yesterday (290 kcal and 2 grams protein) and 100% of Heart Healthy breakfast today (730 kcal and 21 grams protein). Boost Breeze already ordered TID and patient accepted 2 of 4 cartons offered.   Per chart review, current weight is 220 lb and weight at [facility] on 3/30 was 231 lb. This indicates 11 lb weight loss (4.7% body weight) in the past 1 month.  Patient being followed by Cardiology and Surgery. Plan is for lap chole once INR improved.    Patient reports that he has had intermittent N/V/D with associated decrease in appetite and oral intakes over the past ~2 months. During this time he was experiencing weight loss and feels that he has lost ~10 lb in that time frame. No pain or difficulty with chewing or swallowing. Eating and drinking would sometimes trigger and/or worsen symptoms.   Medications reviewed; sliding scale novolog. Labs reviewed; Alk Phos and LFTs elevated. IVF; 1/2 NS @ 75 ml/hr.     NUTRITION - FOCUSED PHYSICAL EXAM:  unable to complete at this time.  Diet Order:   Diet Order            Diet NPO time specified  Diet effective midnight        Diet Heart Room service appropriate? Yes; Fluid consistency: Thin  Diet effective now              EDUCATION NEEDS:   Not appropriate for education at this time  Skin:  Skin Assessment: Reviewed RN Assessment  Last BM:  4/28  Height:   Ht Readings from Last 1 Encounters:  12/28/18 _0  (1.93 m)    Weight:   Wt Readings from Last 1 Encounters:  12/28/18 99.8 kg    Ideal Body Weight:  91.8 kg  BMI:  Body mass index is 26.79 kg/m.  Estimated Nutritional Needs:   Kcal:  1850-2100 kcal  Protein:  80-90 grams  Fluid:  >/= 2 L/day     Jarome Matin, MS, RD, LDN, [facility] Inpatient Clinical Dietitian Pager # [phone] After hours/weekend pager # [phone]

## 2018-12-31 ENCOUNTER — Encounter (HOSPITAL_COMMUNITY): Payer: Self-pay | Admitting: *Deleted

## 2018-12-31 ENCOUNTER — Inpatient Hospital Stay (HOSPITAL_COMMUNITY): Payer: Medicare HMO | Admitting: Physician Assistant

## 2018-12-31 ENCOUNTER — Inpatient Hospital Stay (HOSPITAL_COMMUNITY): Payer: Medicare HMO

## 2018-12-31 ENCOUNTER — Encounter (HOSPITAL_COMMUNITY)
Admission: EM | Disposition: A | Discharge status: Home / Self Care | Payer: Self-pay | Source: Other Acute Inpatient Hospital | Attending: Internal Medicine

## 2018-12-31 HISTORY — PX: LAPAROSCOPIC CHOLECYSTECTOMY SINGLE SITE WITH INTRAOPERATIVE CHOLANGIOGRAM: SHX6538

## 2018-12-31 LAB — COMPREHENSIVE METABOLIC PANEL
ALT: 61 U/L — ABNORMAL HIGH (ref 0–44)
AST: 41 U/L (ref 15–41)
Albumin: 3.1 g/dL — ABNORMAL LOW (ref 3.5–5.0)
Alkaline Phosphatase: 211 U/L — ABNORMAL HIGH (ref 38–126)
Anion gap: 7 (ref 5–15)
BUN: 17 mg/dL (ref 8–23)
CO2: 22 mmol/L (ref 22–32)
Calcium: 9 mg/dL (ref 8.9–10.3)
Chloride: 109 mmol/L (ref 98–111)
Creatinine, Ser: 1.04 mg/dL (ref 0.61–1.24)
GFR calc Af Amer: >60 mL/min (ref >60)
GFR calc non Af Amer: >60 mL/min (ref >60)
Glucose, Bld: 106 mg/dL — ABNORMAL HIGH (ref 70–99)
Potassium: 3.7 mmol/L (ref 3.5–5.1)
Sodium: 138 mmol/L (ref 135–145)
Total Bilirubin: 1.2 mg/dL (ref 0.3–1.2)
Total Protein: 6.4 g/dL — ABNORMAL LOW (ref 6.5–8.1)

## 2018-12-31 LAB — PROTIME-INR
INR: 1.4 — ABNORMAL HIGH (ref 0.8–1.2)
Prothrombin Time: 17.1 seconds — ABNORMAL HIGH (ref 11.4–15.2)

## 2018-12-31 LAB — GLUCOSE, CAPILLARY
Glucose-Capillary: 98 mg/dL (ref 70–99)
Glucose-Capillary: 94 mg/dL (ref 70–99)
Glucose-Capillary: 157 mg/dL — ABNORMAL HIGH (ref 70–99)
Glucose-Capillary: 155 mg/dL — ABNORMAL HIGH (ref 70–99)

## 2018-12-31 SURGERY — LAPAROSCOPIC CHOLECYSTECTOMY SINGLE SITE WITH INTRAOPERATIVE CHOLANGIOGRAM
Anesthesia: General

## 2018-12-31 MED ORDER — ROCURONIUM BROMIDE 10 MG/ML (PF) SYRINGE
PREFILLED_SYRINGE | INTRAVENOUS | Status: DC | PRN
  Administered 2018-12-31: 40 mg via INTRAVENOUS
  Administered 2018-12-31: 20 mg via INTRAVENOUS

## 2018-12-31 MED ORDER — 0.9 % SODIUM CHLORIDE (POUR BTL) OPTIME
TOPICAL | Status: DC | PRN
  Administered 2018-12-31: 16:00:00 1000 mL

## 2018-12-31 MED ORDER — FENTANYL CITRATE (PF) 100 MCG/2ML IJ SOLN
INTRAMUSCULAR | Status: AC
  Filled 2018-12-31: qty 2

## 2018-12-31 MED ORDER — ONDANSETRON HCL 4 MG/2ML IJ SOLN: 4.0000 mg | Freq: Once | INTRAMUSCULAR | Status: DC | PRN

## 2018-12-31 MED ORDER — ROCURONIUM BROMIDE 10 MG/ML (PF) SYRINGE
PREFILLED_SYRINGE | INTRAVENOUS | Status: AC
  Filled 2018-12-31: qty 10

## 2018-12-31 MED ORDER — BUPIVACAINE-EPINEPHRINE 0.25% -1:200000 IJ SOLN
INTRAMUSCULAR | Status: DC | PRN
  Administered 2018-12-31: 30 mL

## 2018-12-31 MED ORDER — KETOROLAC TROMETHAMINE 30 MG/ML IJ SOLN
INTRAMUSCULAR | Status: AC
  Filled 2018-12-31: qty 1

## 2018-12-31 MED ORDER — FENTANYL CITRATE (PF) 250 MCG/5ML IJ SOLN
INTRAMUSCULAR | Status: DC | PRN
  Administered 2018-12-31 (×4): 50 ug via INTRAVENOUS

## 2018-12-31 MED ORDER — SUCCINYLCHOLINE CHLORIDE 200 MG/10ML IV SOSY
PREFILLED_SYRINGE | INTRAVENOUS | Status: DC | PRN
  Administered 2018-12-31: 140 mg via INTRAVENOUS

## 2018-12-31 MED ORDER — ONDANSETRON HCL 4 MG/2ML IJ SOLN
INTRAMUSCULAR | Status: DC | PRN
  Administered 2018-12-31: 4 mg via INTRAVENOUS

## 2018-12-31 MED ORDER — PROPOFOL 10 MG/ML IV BOLUS
INTRAVENOUS | Status: DC | PRN
  Administered 2018-12-31: 150 mg via INTRAVENOUS

## 2018-12-31 MED ORDER — FENTANYL CITRATE (PF) 100 MCG/2ML IJ SOLN
25.0000 ug | INTRAMUSCULAR | Status: DC | PRN
  Administered 2018-12-31 (×3): 50 ug via INTRAVENOUS

## 2018-12-31 MED ORDER — HYDROMORPHONE HCL 1 MG/ML IJ SOLN
INTRAMUSCULAR | Status: AC
  Filled 2018-12-31: qty 1

## 2018-12-31 MED ORDER — PROPOFOL 10 MG/ML IV BOLUS
INTRAVENOUS | Status: AC
  Filled 2018-12-31: qty 20

## 2018-12-31 MED ORDER — FENTANYL CITRATE (PF) 100 MCG/2ML IJ SOLN
INTRAMUSCULAR | Status: AC
  Filled 2018-12-31: qty 4

## 2018-12-31 MED ORDER — SUCCINYLCHOLINE CHLORIDE 200 MG/10ML IV SOSY
PREFILLED_SYRINGE | INTRAVENOUS | Status: AC
  Filled 2018-12-31: qty 10

## 2018-12-31 MED ORDER — BUPIVACAINE LIPOSOME 1.3 % IJ SUSP
20.0000 mL | Freq: Once | INTRAMUSCULAR | Status: AC
  Administered 2018-12-31: 20 mL
  Filled 2018-12-31: qty 20

## 2018-12-31 MED ORDER — LIDOCAINE 2% (20 MG/ML) 5 ML SYRINGE
INTRAMUSCULAR | Status: AC
  Filled 2018-12-31: qty 5

## 2018-12-31 MED ORDER — HYDROMORPHONE HCL 1 MG/ML IJ SOLN
0.2500 mg | INTRAMUSCULAR | Status: DC | PRN
  Administered 2018-12-31 (×3): 0.5 mg via INTRAVENOUS

## 2018-12-31 MED ORDER — KETOROLAC TROMETHAMINE 30 MG/ML IJ SOLN
30.0000 mg | Freq: Once | INTRAMUSCULAR | Status: AC
  Administered 2018-12-31: 30 mg via INTRAVENOUS

## 2018-12-31 MED ORDER — DEXAMETHASONE SODIUM PHOSPHATE 10 MG/ML IJ SOLN
INTRAMUSCULAR | Status: DC | PRN
  Administered 2018-12-31: 4 mg via INTRAVENOUS

## 2018-12-31 MED ORDER — SODIUM CHLORIDE 0.9 % IV SOLN
INTRAVENOUS | Status: AC
  Filled 2018-12-31: qty 20

## 2018-12-31 MED ORDER — LIDOCAINE 2% (20 MG/ML) 5 ML SYRINGE
INTRAMUSCULAR | Status: DC | PRN
  Administered 2018-12-31: 80 mg via INTRAVENOUS

## 2018-12-31 MED ORDER — STERILE WATER FOR IRRIGATION IR SOLN
Status: DC | PRN
  Administered 2018-12-31: 1000 mL

## 2018-12-31 MED ORDER — SODIUM CHLORIDE 0.9 % IV SOLN
2.0000 g | INTRAVENOUS | Status: AC
  Administered 2018-12-31: 2 g via INTRAVENOUS

## 2018-12-31 MED ORDER — BUPIVACAINE-EPINEPHRINE (PF) 0.25% -1:200000 IJ SOLN
INTRAMUSCULAR | Status: AC
  Filled 2018-12-31: qty 30

## 2018-12-31 MED ORDER — IOHEXOL 300 MG/ML  SOLN
INTRAMUSCULAR | Status: DC | PRN
  Administered 2018-12-31: 10 mL

## 2018-12-31 MED ORDER — LACTATED RINGERS IV SOLN
INTRAVENOUS | Status: DC | PRN
  Administered 2018-12-31 (×2): via INTRAVENOUS

## 2018-12-31 MED ORDER — SUGAMMADEX SODIUM 200 MG/2ML IV SOLN
INTRAVENOUS | Status: DC | PRN
  Administered 2018-12-31: 400 mg via INTRAVENOUS

## 2018-12-31 SURGICAL SUPPLY — 41 items
APPLIER CLIP 5 13 M/L LIGAMAX5 (MISCELLANEOUS) ×2
CABLE HIGH FREQUENCY MONO STRZ (ELECTRODE) ×2 IMPLANT
CHLORAPREP W/TINT 26 (MISCELLANEOUS) ×2 IMPLANT
CLIP APPLIE 5 13 M/L LIGAMAX5 (MISCELLANEOUS) ×1 IMPLANT
COVER MAYO STAND STRL (DRAPES) ×2 IMPLANT
COVER SURGICAL LIGHT HANDLE (MISCELLANEOUS) ×2 IMPLANT
COVER WAND RF STERILE (DRAPES) IMPLANT
DECANTER SPIKE VIAL GLASS SM (MISCELLANEOUS) ×2 IMPLANT
DRAIN CHANNEL 19F RND (DRAIN) IMPLANT
DRAPE C-ARM 42X120 X-RAY (DRAPES) ×2 IMPLANT
DRAPE WARM FLUID 44X44 (DRAPE) ×2 IMPLANT
DRSG TEGADERM 4X4.75 (GAUZE/BANDAGES/DRESSINGS) ×2 IMPLANT
ELECT REM PT RETURN 15FT ADLT (MISCELLANEOUS) ×2 IMPLANT
ENDOLOOP SUT PDS II  0 18 (SUTURE) ×2
ENDOLOOP SUT PDS II 0 18 (SUTURE) IMPLANT
EVACUATOR SILICONE 100CC (DRAIN) IMPLANT
GAUZE SPONGE 2X2 8PLY STRL LF (GAUZE/BANDAGES/DRESSINGS) ×1 IMPLANT
GLOVE ECLIPSE 8.0 STRL XLNG CF (GLOVE) ×2 IMPLANT
GLOVE INDICATOR 8.0 STRL GRN (GLOVE) ×2 IMPLANT
GOWN STRL REUS W/TWL XL LVL3 (GOWN DISPOSABLE) ×4 IMPLANT
IRRIG SUCT STRYKERFLOW 2 WTIP (MISCELLANEOUS) ×2
IRRIGATION SUCT STRKRFLW 2 WTP (MISCELLANEOUS) ×1 IMPLANT
KIT BASIN OR (CUSTOM PROCEDURE TRAY) ×2 IMPLANT
KIT TURNOVER KIT A (KITS) ×1 IMPLANT
PAD POSITIONING PINK XL (MISCELLANEOUS) ×2 IMPLANT
POUCH RETRIEVAL ECOSAC 10 (ENDOMECHANICALS) IMPLANT
POUCH RETRIEVAL ECOSAC 10MM (ENDOMECHANICALS) ×1
PROTECTOR NERVE ULNAR (MISCELLANEOUS) ×1 IMPLANT
SCISSORS LAP 5X35 DISP (ENDOMECHANICALS) ×2 IMPLANT
SET CHOLANGIOGRAPH MIX (MISCELLANEOUS) ×2 IMPLANT
SET TUBE SMOKE EVAC HIGH FLOW (TUBING) ×2 IMPLANT
SHEARS HARMONIC ACE PLUS 36CM (ENDOMECHANICALS) ×2 IMPLANT
SPONGE GAUZE 2X2 STER 10/PKG (GAUZE/BANDAGES/DRESSINGS) ×1
SUT MNCRL AB 4-0 PS2 18 (SUTURE) ×2 IMPLANT
SUT PDS AB 1 CT1 27 (SUTURE) ×5 IMPLANT
SYR 20CC LL (SYRINGE) ×2 IMPLANT
TOWEL OR 17X26 10 PK STRL BLUE (TOWEL DISPOSABLE) ×2 IMPLANT
TOWEL OR NON WOVEN STRL DISP B (DISPOSABLE) ×2 IMPLANT
TRAY LAPAROSCOPIC (CUSTOM PROCEDURE TRAY) ×2 IMPLANT
TROCAR BLADELESS OPT 5 100 (ENDOMECHANICALS) ×2 IMPLANT
TROCAR BLADELESS OPT 5 150 (ENDOMECHANICALS) ×2 IMPLANT

## 2018-12-31 NOTE — Plan of Care (Signed)

## 2018-12-31 NOTE — Interval H&P Note (Signed)
History and Physical Interval Note:  12/31/2018 2:44 PM  Brandon [name]  has presented today for surgery, with the diagnosis of CHOLELITHIASIS.  The various methods of treatment have been discussed with the patient and family. After consideration of risks, benefits and other options for treatment, the patient has consented to  Procedure(s): LAPAROSCOPIC CHOLECYSTECTOMY SINGLE SITE WITH INTRAOPERATIVE CHOLANGIOGRAM (N/A) as a surgical intervention.  The patient's history has been reviewed, patient examined, no change in status, stable for surgery.  I have reviewed the patient's chart and labs.  Questions were answered to the patient's satisfaction.    The anatomy & physiology of hepatobiliary & pancreatic function was discussed.  The pathophysiology of gallbladder dysfunction was discussed.  Natural history risks without surgery was discussed.   I feel the risks of no intervention will lead to serious problems that outweigh the operative risks; therefore, I recommended cholecystectomy to remove the pathology.  I explained laparoscopic techniques with possible need for an open approach.  Probable cholangiogram to evaluate the bilary tract was explained as well.    Risks such as bleeding, infection, abscess, leak, injury to other organs, need for repair of tissues / organs, need for further treatment, stroke, heart attack, death, and other risks were discussed.  I noted a good likelihood this will help address the problem.  Possibility that this will not correct all abdominal symptoms was explained.  Goals of post-operative recovery were discussed as well.  We will work to minimize complications.  An educational handout further explaining the pathology and treatment options was given as well.  Questions were answered.  The patient expresses understanding & wishes to proceed with surgery.   I have re-reviewed the the patient's records, history, medications, and allergies.  I have re-examined the patient.   I again discussed intraoperative plans and goals of post-operative recovery.  The patient agrees to proceed.  Brandon [name]  07/08/1941 [id number]  Patient Care Team: Gordan Payment., MD as PCP - General (Internal Medicine)  Patient Active Problem List   Diagnosis Date Noted  . Chronic diastolic CHF (congestive heart failure) (HCC) 12/28/2018    Priority: High  . Pulmonary embolism on long-term anticoagulation therapy (HCC) 12/29/2018    Priority: Medium  . CKD (chronic kidney disease) stage 3, GFR 30-59 ml/min (HCC) 07/30/2018    Priority: Medium  . 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  . DM (diabetes mellitus) (HCC) 12/29/2018  . Choledocholithiasis 12/28/2018  . Class 1 obesity due to excess calories with serious comorbidity and body mass index (BMI) of 30.0 to 30.9 in adult 01/07/2018  . Benign prostatic hyperplasia with urinary frequency 12/02/2017  . Balance disorder 08/22/2017  . History of pulmonary embolism 02/19/2017  . Diabetic polyneuropathy associated with type 2 diabetes mellitus (HCC) 04/16/2016  . Primary osteoarthritis involving multiple joints 04/15/2016  . Elevated hemidiaphragm 01/24/2016  . Gait abnormality 01/24/2016  . Essential hypertension 11/22/2015  . Anticoagulant long-term use 11/22/2015  . Cervical disc disease 11/22/2015  . Gastroesophageal reflux disease 11/22/2015  . Gout 11/22/2015  . High risk medication use 11/22/2015  . Lung mass 11/22/2015  . Major depressive disorder with current active episode 11/22/2015  . Mixed hyperlipidemia 11/22/2015  . Malaise and fatigue 11/22/2015  . Testosterone deficiency 11/22/2015  . Vitamin B12 deficiency 11/22/2015    Past Medical History:  Diagnosis Date  . CHF (congestive heart failure) (HCC)   . Coronary artery disease   . Diabetes mellitus without complication (HCC)   . Gallstones   .  GERD (gastroesophageal reflux disease)   . History of anticoagulant therapy   . Hyperlipidemia   .  Hypertension   . Prostate disease   . Pulmonary emboli (HCC)   . Sleep apnea    does not use CPAP    Past Surgical History:  Procedure Laterality Date  . CARDIAC CATHETERIZATION    . right hip replacement      Social History   Socioeconomic History  . Marital status: Married    Spouse name: Burna Mortimer  . Number of children: 1  . Years of education: 73  . Highest education level: 12th grade  Occupational History  . Not on file  Social Needs  . Financial resource strain: Not very hard  . Food insecurity:    Worry: Never true    Inability: Never true  . Transportation needs:    Medical: No    Non-medical: No  Tobacco Use  . Smoking status: Former Smoker    Packs/day: 0.25    Years: 10.00    Pack years: 2.50    Start date: 1977    Last attempt to quit: 1987    Years since quitting: 33.3  . Smokeless tobacco: Never Used  . Tobacco comment: declined  Substance and Sexual Activity  . Alcohol use: Never    Frequency: Never  . Drug use: Never  . Sexual activity: Not Currently  Lifestyle  . Physical activity:    Days per week: 0 days    Minutes per session: 0 min  . Stress: Only a little  Relationships  . Social connections:    Talks on phone: Three times a week    Gets together: Never    Attends religious service: 1 to 4 times per year    Active member of club or organization: No    Attends meetings of clubs or organizations: Never    Relationship status: Married  . Intimate partner violence:    Fear of current or ex partner: No    Emotionally abused: No    Physically abused: No    Forced sexual activity: No  Other Topics Concern  . 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  Does not go out much.     Family History  Problem Relation Age of Onset  . Heart disease Father        Had heart surgery in his 53's    Medications Prior to Admission  Medication Sig Dispense Refill Last Dose  . Colchicine 0.6 MG CAPS Take 0.6 mg by  mouth daily.   12/27/2018 at Unknown time  . DULoxetine (CYMBALTA) 30 MG capsule Take 30 mg by mouth daily.   12/27/2018 at Unknown time  . febuxostat (ULORIC) 40 MG tablet Take 40 mg by mouth daily.   12/27/2018 at Unknown time  . fenofibrate 160 MG tablet Take 160 mg by mouth daily.   Past Week at Unknown time  . finasteride (PROSCAR) 5 MG tablet Take 5 mg by mouth daily.   12/27/2018 at Unknown time  . fosinopril (MONOPRIL) 40 MG tablet Take 40 mg by mouth daily.   12/27/2018 at Unknown time  . furosemide (LASIX) 20 MG tablet Take 20 mg by mouth daily.   12/27/2018 at Unknown time  . glipiZIDE (GLUCOTROL XL) 5 MG 24 hr tablet Take 5 mg by mouth daily.   12/27/2018 at Unknown time  . omeprazole (PRILOSEC) 10 MG capsule Take 10 mg by mouth daily.  12/27/2018 at Unknown time  . oxyCODONE-acetaminophen (PERCOCET) 10-325 MG tablet Take 1 tablet by mouth 2 (two) times a day.   12/27/2018 at AM  . tamsulosin (FLOMAX) 0.4 MG CAPS capsule Take 0.4 mg by mouth daily.   Past Week at Unknown time  . warfarin (COUMADIN) 5 MG tablet Take 5 mg by mouth daily.   12/27/2018 at Unknown time    Current Facility-Administered Medications  Medication Dose Route Frequency Provider Last Rate Last Dose  . 0.45 % sodium chloride infusion   Intravenous Continuous Rometta Emery, MD 75 mL/hr at 12/31/18 0200    . [MAR Hold] colchicine tablet 0.6 mg  0.6 mg Oral Daily Marguerita Merles [place], DO   0.6 mg at 12/31/18 0935  . [MAR Hold] DULoxetine (CYMBALTA) DR capsule 30 mg  30 mg Oral Daily Marguerita Merles [place], DO   30 mg at 12/31/18 0935  . [MAR Hold] febuxostat (ULORIC) tablet 40 mg  40 mg Oral Daily Marguerita Merles [place], DO   40 mg at 12/31/18 0935  . [MAR Hold] feeding supplement (BOOST / RESOURCE BREEZE) liquid 1 Container  1 Container Oral TID BM Rometta Emery, MD   Stopped at 12/30/18 1013  . [MAR Hold] feeding supplement (ENSURE ENLIVE) (ENSURE ENLIVE) liquid 237 mL  237 mL Oral Q24H Mikhail, [place], DO      . Mitzi Hansen  Hold] fenofibrate tablet 160 mg  160 mg Oral Daily Marguerita Merles [place], DO   160 mg at 12/31/18 0935  . [MAR Hold] finasteride (PROSCAR) tablet 5 mg  5 mg Oral Daily Marguerita Merles [place], DO   5 mg at 12/31/18 0935  . [MAR Hold] insulin aspart (novoLOG) injection 0-9 Units  0-9 Units Subcutaneous TID WC Rometta Emery, MD   1 Units at 12/30/18 1647  . [MAR Hold] ketorolac (TORADOL) 15 MG/ML injection 15 mg  15 mg Intravenous Q6H PRN Rometta Emery, MD      . Mitzi Hansen Hold] multivitamin with minerals tablet 1 tablet  1 tablet Oral Daily Edsel Petrin, DO   1 tablet at 12/31/18 0935  . [MAR Hold] mupirocin ointment (BACTROBAN) 2 % 1 application  1 application Nasal BID Therisa Doyne, MD   1 application at 12/31/18 0937  . [MAR Hold] ondansetron (ZOFRAN) tablet 4 mg  4 mg Oral Q6H PRN Rometta Emery, MD       Or  . Mitzi Hansen Hold] ondansetron (ZOFRAN) injection 4 mg  4 mg Intravenous Q6H PRN Rometta Emery, MD      . Mitzi Hansen Hold] oxyCODONE-acetaminophen (PERCOCET/ROXICET) 5-325 MG per tablet 1 tablet  1 tablet Oral BID Marguerita Merles [place], DO   1 tablet at 12/31/18 2956   And  . [MAR Hold] oxyCODONE (Oxy IR/ROXICODONE) immediate release tablet 5 mg  5 mg Oral BID Marguerita Merles [place], DO   5 mg at 12/31/18 2130  . [MAR Hold] pantoprazole (PROTONIX) EC tablet 40 mg  40 mg Oral Daily Marguerita Merles [place], DO   40 mg at 12/31/18 0935  . sodium chloride 0.9 % with cefTRIAXone (ROCEPHIN) ADS Med           . [QMV Hold] tamsulosin (FLOMAX) capsule 0.4 mg  0.4 mg Oral Daily Marguerita Merles [place], DO   0.4 mg at 12/31/18 0935     Allergies  Allergen Reactions  . Statins     Other reaction(s): Cramps (ALLERGY/intolerance)  . Allopurinol Rash    Unknown  . Doxycycline Rash    BP Marland Kitchen)  124/102   Pulse 64   Temp 98.4 F (36.9 C) (Oral)   Resp 15   Ht 6\' 4"  (1.93 m)   Wt 99.8 kg   SpO2 95%   BMI 26.79 kg/m   Labs: Results for orders placed or performed during the hospital encounter of  12/28/18 (from the past 48 hour(s))  Glucose, capillary     Status: None   Collection Time: 12/29/18  4:56 PM  Result Value Ref Range   Glucose-Capillary 86 70 - 99 mg/dL  Glucose, capillary     Status: Abnormal   Collection Time: 12/29/18  7:42 PM  Result Value Ref Range   Glucose-Capillary 108 (H) 70 - 99 mg/dL  Comprehensive metabolic panel     Status: Abnormal   Collection Time: 12/30/18  4:09 AM  Result Value Ref Range   Sodium 138 135 - 145 mmol/L   Potassium 3.7 3.5 - 5.1 mmol/L   Chloride 109 98 - 111 mmol/L   CO2 22 22 - 32 mmol/L   Glucose, Bld 90 70 - 99 mg/dL   BUN 15 8 - 23 mg/dL   Creatinine, Ser 4.401.03 0.61 - 1.24 mg/dL   Calcium 8.8 (L) 8.9 - 10.3 mg/dL   Total Protein 6.4 (L) 6.5 - 8.1 g/dL   Albumin 3.0 (L) 3.5 - 5.0 g/dL   AST 72 (H) 15 - 41 U/L   ALT 83 (H) 0 - 44 U/L   Alkaline Phosphatase 234 (H) 38 - 126 U/L   Total Bilirubin 1.3 (H) 0.3 - 1.2 mg/dL   GFR calc non Af Amer >60 >60 mL/min   GFR calc Af Amer >60 >60 mL/min   Anion gap 7 5 - 15    Comment: Performed at [facility], [address] [place], [place] [address]  Protime-INR     Status: Abnormal   Collection Time: 12/30/18  4:09 AM  Result Value Ref Range   Prothrombin Time 21.2 (H) 11.4 - 15.2 seconds   INR 1.9 (H) 0.8 - 1.2    Comment: (NOTE) INR goal varies based on device and disease states. Performed at [facility], [address] [place], [place] [address]   CBC with Differential/Platelet     Status: Abnormal   Collection Time: 12/30/18  4:09 AM  Result Value Ref Range   WBC 7.5 4.0 - 10.5 K/uL   RBC 4.24 4.22 - 5.81 MIL/uL   Hemoglobin 12.1 (L) 13.0 - 17.0 g/dL   HCT 40.339.7 47.439.0 - 25.952.0 %   MCV 93.6 80.0 - 100.0 fL   MCH 28.5 26.0 - 34.0 pg   MCHC 30.5 30.0 - 36.0 g/dL   RDW 56.315.7 (H) 87.511.5 - 64.315.5 %   Platelets 225 150 - 400 K/uL   nRBC 0.0 0.0 - 0.2 %   Neutrophils Relative % 50 %   Neutro Abs 3.8 1.7 - 7.7 K/uL   Lymphocytes Relative 31 %    Lymphs Abs 2.3 0.7 - 4.0 K/uL   Monocytes Relative 13 %   Monocytes Absolute 0.9 0.1 - 1.0 K/uL   Eosinophils Relative 4 %   Eosinophils Absolute 0.3 0.0 - 0.5 K/uL   Basophils Relative 1 %   Basophils Absolute 0.1 0.0 - 0.1 K/uL   Immature Granulocytes 1 %   Abs Immature Granulocytes 0.05 0.00 - 0.07 K/uL    Comment: Performed at [facility], [address] [place], [place] [address]  Magnesium     Status:  None   Collection Time: 12/30/18  4:09 AM  Result Value Ref Range   Magnesium 2.1 1.7 - 2.4 mg/dL    Comment: Performed at [facility], [address] [place], [place] [address]  Phosphorus     Status: None   Collection Time: 12/30/18  4:09 AM  Result Value Ref Range   Phosphorus 3.2 2.5 - 4.6 mg/dL    Comment: Performed at [facility], [address] [place], [place] [address]  Lipase, blood     Status: None   Collection Time: 12/30/18  4:09 AM  Result Value Ref Range   Lipase 25 11 - 51 U/L    Comment: Performed at [facility], [address], [place], [place] [address]  Glucose, capillary     Status: None   Collection Time: 12/30/18  7:47 AM  Result Value Ref Range   Glucose-Capillary 90 70 - 99 mg/dL  Glucose, capillary     Status: Abnormal   Collection Time: 12/30/18 11:42 AM  Result Value Ref Range   Glucose-Capillary 131 (H) 70 - 99 mg/dL  Glucose, capillary     Status: Abnormal   Collection Time: 12/30/18  4:31 PM  Result Value Ref Range   Glucose-Capillary 125 (H) 70 - 99 mg/dL  Glucose, capillary     Status: Abnormal   Collection Time: 12/30/18 10:55 PM  Result Value Ref Range   Glucose-Capillary 101 (H) 70 - 99 mg/dL  Protime-INR     Status: Abnormal   Collection Time: 12/31/18  4:27 AM  Result Value Ref Range   Prothrombin Time 17.1 (H) 11.4 - 15.2 seconds   INR 1.4 (H) 0.8 - 1.2    Comment: (NOTE) INR goal varies based on device and disease states. Performed at [facility], [address], [place], [place] [address]   Comprehensive metabolic panel     Status: Abnormal   Collection Time: 12/31/18  4:27 AM  Result Value Ref Range   Sodium 138 135 - 145 mmol/L   Potassium 3.7 3.5 - 5.1 mmol/L   Chloride 109 98 - 111 mmol/L   CO2 22 22 - 32 mmol/L   Glucose, Bld 106 (H) 70 - 99 mg/dL   BUN 17 8 - 23 mg/dL   Creatinine, Ser 2.95 0.61 - 1.24 mg/dL   Calcium 9.0 8.9 - 62.1 mg/dL   Total Protein 6.4 (L) 6.5 - 8.1 g/dL   Albumin 3.1 (L) 3.5 - 5.0 g/dL   AST 41 15 - 41 U/L   ALT 61 (H) 0 - 44 U/L   Alkaline Phosphatase 211 (H) 38 - 126 U/L   Total Bilirubin 1.2 0.3 - 1.2 mg/dL   GFR calc non Af Amer >60 >60 mL/min   GFR calc Af Amer >60 >60 mL/min   Anion gap 7 5 - 15    Comment: Performed at [facility], [address], [place], [place] [address]  Glucose, capillary     Status: None   Collection Time: 12/31/18  8:29 AM  Result Value Ref Range   Glucose-Capillary 98 70 - 99 mg/dL  Glucose, capillary     Status: None   Collection Time: 12/31/18 11:46 AM  Result Value Ref Range   Glucose-Capillary 94 70 - 99 mg/dL    Imaging / Studies: No results found.   Ardeth Sportsman, M.D., F.A.C.S. Gastrointestinal and Minimally Invasive Surgery [facility] [address], [phone]  [place], [place] [address] [phone] Main / Paging  12/31/2018 2:44 PM    Ardeth Sportsman

## 2018-12-31 NOTE — Op Note (Signed)
12/31/2018  PATIENT:  Brandon [name]  77 y.o. male  Patient Care Team: Grisso, Greg A., MD as PCP - General (Internal Medicine)  PRE-OPERATIVE DIAGNOSIS:    Acute on Chronic Calculus Cholecystitis  Possible choledocolithiasis  POST-OPERATIVE DIAGNOSIS:   Acute on Chronic Calculus Cholecystitis Choledocolithiasis Umbilical hernia  PROCEDURE:  SINGLE SITE Laparoscopic cholecystectomy with intraoperative cholangiogram  Primary umbilical hernia repair  SURGEON:  [place] C. Maryclare Nydam, MD, FACS.  ASSISTANT: OR Staff   ANESTHESIA:    General with endotracheal intubation Local anesthetic as a field block  EBL:  (See Anesthesia Intraoperative Record) Total I/O In: 1792.2 [I.V.:1792.2] Out: 650 [Urine:650]  Delay start of Pharmacological VTE agent (>24hrs) due to surgical blood loss or risk of bleeding:  no  DRAINS: None   SPECIMEN: Gallbladder    DISPOSITION OF SPECIMEN:  PATHOLOGY  COUNTS:  YES  PLAN OF CARE: Admit to inpatient   PATIENT DISPOSITION:  PACU - hemodynamically stable.  INDICATION: Pleasant gentleman with cardiac disease on warfarin with persistent abdominal pain for several weeks.  Came in with increased liver function test.  Suspicious for symptomatic gallstones and probable cholecystitis.  Possible choledocholithiasis.  Gastroenterology and general surgery consultations.  Patient has hardware and cannot undergo MRCP.  Pain less and liver function tire numbers improved.  Decision made to proceed with cholecystectomy with cholangiogram first with possible need of ERCP postoperatively.  Had to delay surgery until INR normalized on hospital day #3  The anatomy & physiology of hepatobiliary & pancreatic function was discussed.  The pathophysiology of gallbladder dysfunction was discussed.  Natural history risks without surgery was discussed.   I feel the risks of no intervention will lead to serious problems that outweigh the operative risks; therefore, I  recommended cholecystectomy to remove the pathology.  I explained laparoscopic techniques with possible need for an open approach.  Probable cholangiogram to evaluate the bilary tract was explained as well.    Risks such as bleeding, infection, abscess, leak, injury to other organs, need for further treatment, heart attack, death, and other risks were discussed.  I noted a good likelihood this will help address the problem.  Possibility that this will not correct all abdominal symptoms was explained.  Goals of post-operative recovery were discussed as well.  We will work to minimize complications.  An educational handout further explaining the pathology and treatment options was given as well.  Questions were answered.  The patient expresses understanding & wishes to proceed with surgery.  OR FINDINGS: Dilated boggy gallbladder quite stretched out with chronic changes suspicious for chronic cholecystitis.  Edema and distention consistent with acute cholecystitis  Dilated cystic duct.  Cholangiogram with dilated biliary system with obvious oblate hyperlucencies in reverse meniscus sign classic for choledocholithiasis/common bile duct stones.  No contrast into duodenum  Liver: normal  Obese with significant diastases recti and thin abdominal wall.  15 mm umbilical hernia.  Primarily repaired  DESCRIPTION:   The patient was identified & brought in the operating room. The patient was positioned supine with arms tucked. SCDs were active during the entire case. The patient underwent general anesthesia without any difficulty.  The abdomen was prepped and draped in a sterile fashion. A Surgical Timeout confirmed our plan.  I made a transverse curvilinear incision through the superior umbilical fold.  I placed a [id number] long port through the supraumbilical fascia through his obvious hernia using a modified Hassan cutdown technique with umbilical stalk fascial countertraction. I began carbon dioxide insufflation.   No  change in end tidal CO2 measurement.   Camera inspection revealed no injury. There were no adhesions to the anterior abdominal wall supraumbilically.  I proceeded to continue with single site technique. I placed a #5 port in left upper aspect of the wound. I placed a 5 mm atraumatic grasper in the right inferior aspect of the wound.  I turned attention to the right upper quadrant.  Gallbladder was quite distended and dilated and enlarged.  Some edema.  Consistent with acute on chronic cholecystitis.  I was not able to grab it so I ended up doing needle aspiration to better decompress it.  Had persistent gallbladder wall thickening but no evidence of any gangrene.  The gallbladder fundus was elevated cephalad. I freed adhesions to the ventral surface of the gallbladder off carefully.  I freed the peritoneal coverings between the gallbladder and the liver on the posteriolateral and anteriomedial walls. I alternated between Harmonic & blunt Maryland dissection to help get a good critical view of the cystic artery and cystic duct.  I did further dissection to free 50% of the gallbladder off the liver bed to get a good critical view of the infundibulum and cystic duct. I dissected out the cystic artery; and, after getting a good 360 view, ligated the anterior & posterior branches of the cystic artery close on the infundibulum using the Harmonic ultrasonic dissection.  I skeletonized the cystic duct.  It was definitely dilated  I placed a clip on the infundibulum.  I did place a second 1 to get across it.   I did a partial cystic duct-otomy and ensured patency. I placed a 5 [place] cholangiocatheter through a puncture site at the right subcostal ridge of the abdominal wall and directed it into the cystic duct.  We ran a cholangiogram with dilute radio-opaque contrast and continuous fluoroscopy. Contrast flowed from a side branch consistent with cystic duct cannulization. Contrast flowed up the common hepatic duct  into the right and left intrahepatic chains out to secondary radicals. Contrast flowed down the common bile duct easily.  Was rather dilated.  There were obvious oblate spherical hyperlucencies mobile consistent with common bile duct stones.  They were at least of medium size.  Contrast stopped at a reverse meniscus sign in the distal common bile duct consistent with an infected distal common bile duct stone.  No leak or ulcerations.    I removed the cholangiocatheter.  I freed the gallbladder from its meaning attachments on the liver bed.  I ligated the cystic duct using 0 PDS Endoloops x2, last sewing around the entire gallbladder and following it down to the proximal cystic duct on the first 1 and then 5 mm more distally closer towards the infundibulum to get 2 separate ligations.  I did place a few clips between that as well.  I completed cystic duct transection.  I ensured hemostasis on the gallbladder fossa of the liver and elsewhere. I inspected the rest of the abdomen & detected no injury nor bleeding elsewhere.  I placed the gallbladder inside and eco-sac bag and I removed the gallbladder out the umbilical hernia where the first port had been placed.   I closed the fascia transversely using #1 PDS interrupted stitches to close the umbilical hernia as well. I closed the skin using 4-0 monocryl stitch.  Sterile dressing was applied. The patient was extubated & arrived in the PACU in stable condition..  Notified Dr. Dulce Sellar with gastroenterology.  He is planning to schedule ERCP tomorrow.  I had discussed postoperative care with the patient in the holding area. I discussed operative findings, updated the patient's status, discussed probable steps to recovery, and gave postoperative recommendations to the patient's spouse.  Recommendations were made.  Questions were answered.  She expressed understanding & appreciation.    Ardeth Sportsman, M.D., F.A.C.S. Gastrointestinal and Minimally Invasive  Surgery [facility] [address], [address] [place], [place] [address] [phone] Main / Paging  12/31/2018 5:01 PM

## 2018-12-31 NOTE — Transfer of Care (Signed)
Immediate Anesthesia Transfer of Care Note  Patient: Brandon [name]  Procedure(s) Performed: LAPAROSCOPIC CHOLECYSTECTOMY SINGLE SITE WITH INTRAOPERATIVE CHOLANGIOGRAM, REPAIR OF UMBILICAL HERNIA (N/A )  Patient Location: PACU  Anesthesia Type:General  Level of Consciousness: awake, alert , oriented and patient cooperative  Airway & Oxygen Therapy: Patient Spontanous Breathing and Patient connected to face mask oxygen  Post-op Assessment: Report given to RN, Post -op Vital signs reviewed and stable and Patient moving all extremities  Post vital signs: Reviewed and stable  Last Vitals:  Vitals Value Taken Time  BP 146/60 12/31/2018  5:08 PM  Temp    Pulse 78 12/31/2018  5:10 PM  Resp 16 12/31/2018  5:10 PM  SpO2 98 % 12/31/2018  5:10 PM  Vitals shown include unvalidated device data.  Last Pain:  Vitals:   12/31/18 1255  TempSrc:   PainSc: 3       Patients Stated Pain Goal: 3 (12/31/18 1255)  Complications: No apparent anesthesia complications

## 2018-12-31 NOTE — Discharge Instructions (Signed)
LAPAROSCOPIC SURGERY: POST OP INSTRUCTIONS ° °###################################################################### ° °EAT °Gradually transition to a high fiber diet with a fiber supplement over the next few weeks after discharge.  Start with a pureed / full liquid diet (see below) ° °WALK °Walk an hour a day.  Control your pain to do that.   ° °CONTROL PAIN °Control pain so that you can walk, sleep, tolerate sneezing/coughing, go up/down stairs. ° °HAVE A BOWEL MOVEMENT DAILY °Keep your bowels regular to avoid problems.  OK to try a laxative to override constipation.  OK to use an antidairrheal to slow down diarrhea.  Call if not better after 2 tries ° °CALL IF YOU HAVE PROBLEMS/CONCERNS °Call if you are still struggling despite following these instructions. °Call if you have concerns not answered by these instructions ° °###################################################################### ° ° ° °1. DIET: Follow a light bland diet the first 24 hours after arrival home, such as soup, liquids, crackers, etc.  Be sure to include lots of fluids daily.  Avoid fast food or heavy meals as your are more likely to get nauseated.  Eat a low fat the next few days after surgery.   ° °2. Take your usually prescribed home medications unless otherwise directed. ° °3. PAIN CONTROL: °a. Pain is best controlled by a usual combination of three different methods TOGETHER: °i. Ice/Heat °ii. Over the counter pain medication °iii. Prescription pain medication °b. Most patients will experience some swelling and bruising around the incisions.  Ice packs or heating pads (30-60 minutes up to 6 times a day) will help. Use ice for the first few days to help decrease swelling and bruising, then switch to heat to help relax tight/sore spots and speed recovery.  Some people prefer to use ice alone, heat alone, alternating between ice & heat.  Experiment to what works for you.  Swelling and bruising can take several weeks to resolve.   °c. It  is helpful to take an over-the-counter pain medication regularly for the first few weeks.  Choose one of the following that works best for you: °i. Naproxen (Aleve, etc)  Two 220mg tabs twice a day °ii. Ibuprofen (Advil, etc) Three 200mg tabs four times a day (every meal & bedtime) °iii. Acetaminophen (Tylenol, etc) 500-650mg four times a day (every meal & bedtime) °d. A  prescription for pain medication (such as oxycodone, hydrocodone, tramadol, gabapentin, methocarbamol, etc) should be given to you upon discharge.  Take your pain medication as prescribed.  °i. If you are having problems/concerns with the prescription medicine (does not control pain, nausea, vomiting, rash, itching, etc), please call us [phone] to see if we need to switch you to a different pain medicine that will work better for you and/or control your side effect better. °ii. If you need a refill on your pain medication, please give us 48 hour notice.  contact your pharmacy.  They will contact our office to request authorization. Prescriptions will not be filled after 5 pm or on week-ends ° °4. Avoid getting constipated.   °a. Between the surgery and the pain medications, it is common to experience some constipation.   °b. Increasing fluid intake and taking a fiber supplement (such as Metamucil, Citrucel, FiberCon, MiraLax, etc) 1-2 times a day regularly will usually help prevent this problem from occurring.   °c. A mild laxative (prune juice, Milk of Magnesia, MiraLax, etc) should be taken according to package directions if there are no bowel movements after 48 hours.   °5. Watch out for   diarrhea.   a. If you have many loose bowel movements, simplify your diet to bland foods & liquids for a few days.   b. Stop any stool softeners and decrease your fiber supplement.   c. Switching to mild anti-diarrheal medications (Kayopectate, Pepto Bismol) can help.   d. If this worsens or does not improve, please call us.  6. Wash / shower every  day.  You may shower over the dressings as they are waterproof.  Continue to shower over incision(s) after the dressing is off.  7. Remove your waterproof bandages 5 days after surgery.  You may leave the incision open to air.  You may replace a dressing/Band-Aid to cover the incision for comfort if you wish.   8. ACTIVITIES as tolerated:   a. You may resume regular (light) daily activities beginning the next day--such as daily self-care, walking, climbing stairs--gradually increasing activities as tolerated.  If you can walk 30 minutes without difficulty, it is safe to try more intense activity such as jogging, treadmill, bicycling, low-impact aerobics, swimming, etc. b. Save the most intensive and strenuous activity for last such as sit-ups, heavy lifting, contact sports, etc  Refrain from any heavy lifting or straining until you are off narcotics for pain control.   c. DO NOT PUSH THROUGH PAIN.  Let pain be your guide: If it hurts to do something, don't do it.  Pain is your body warning you to avoid that activity for another week until the pain goes down. d. You may drive when you are no longer taking prescription pain medication, you can comfortably wear a seatbelt, and you can safely maneuver your car and apply brakes. e. Bonita Quin may have sexual intercourse when it is comfortable.  9. FOLLOW UP in our office a. Please call [facility] at [phone] to set up an appointment to see your surgeon in the office for a follow-up appointment approximately 2-3 weeks after your surgery. b. Make sure that you call for this appointment the day you arrive home to insure a convenient appointment time.  10. IF YOU HAVE DISABILITY OR FAMILY LEAVE FORMS, BRING THEM TO THE OFFICE FOR PROCESSING.  DO NOT GIVE THEM TO YOUR DOCTOR.   WHEN TO CALL us [phone]: 1. Poor pain control 2. Reactions / problems with new medications (rash/itching, nausea, etc)  3. Fever over 101.5 F (38.5 C) 4. Inability to  urinate 5. Nausea and/or vomiting 6. Worsening swelling or bruising 7. Continued bleeding from incision. 8. Increased pain, redness, or drainage from the incision   The clinic staff is available to answer your questions during regular business hours (8:30am-5pm).  Please dont hesitate to call and ask to speak to one of our nurses for clinical concerns.   If you have a medical emergency, go to the nearest emergency room or call 911.  A surgeon from [facility] is always on call at the [facility], [place] [address], [place], [place]  [address] ? MAIN: [phone] ? TOLL FREE: [phone] ?  FAX [phone] [email or web address]     Cholecystitis  Cholecystitis is inflammation of the gallbladder. It is often called a gallbladder attack. The gallbladder is a pear-shaped organ that lies beneath the liver on the right side of the body. The gallbladder stores bile, which is a fluid that helps the body digest fats. If bile builds up in your gallbladder, your gallbladder becomes inflamed. This condition may occur suddenly. Cholecystitis is a  serious condition and requires treatment. What are the causes? The most common cause of this condition is gallstones. Gallstones can block the tube (duct) that carries bile out of your gallbladder. This causes bile to build up. Other causes include:  Damage to the gallbladder due to a decrease in blood flow.  Infections in the bile ducts.  Scars or kinks in the bile ducts.  Tumors in the liver, pancreas, or gallbladder. What increases the risk? You are more likely to develop this condition if:  You have sickle cell disease.  You take birth control pills or use estrogen.  You have alcoholic liver disease.  You have liver cirrhosis.  You have your nutrition delivered through a vein (parenteral nutrition).  You are critically ill.  You do not eat or drink for a long  time. This is also called "fasting."  You are obese.  You lose weight too fast.  You are pregnant.  You have high levels of fat (triglycerides) in the blood.  You have pancreatitis. What are the signs or symptoms? Symptoms of this condition include:  Pain in the abdomen, especially in the upper right area of the abdomen.  Tenderness or bloating in the abdomen.  Nausea.  Vomiting.  Fever.  Chills. How is this diagnosed? This condition is diagnosed with a medical history and physical exam. You may also have other tests, including:  Imaging tests, such as: ? An ultrasound of the gallbladder. ? A CT scan of the abdomen. ? A gallbladder nuclear scan (HIDA scan). This scan allows your health care provider to see the bile moving from your liver to your gallbladder and on to your small intestine. ? MRI.  Blood tests, such as: ? A complete blood count. The white blood cell count may be higher than normal. ? Liver function tests. Certain types of gallstones cause some results to be higher than normal. How is this treated? Treatment may include:  Surgery to remove your gallbladder (cholecystectomy).  Antibiotic medicine, usually through an IV.  Fasting for a certain amount of time.  Giving IV fluids.  Medicine to treat pain or vomiting. Follow these instructions at home:  If you had surgery, follow instructions from your health care provider about home care after the procedure. Medicines   Take over-the-counter and prescription medicines only as told by your health care provider.  If you were prescribed an antibiotic medicine, take it as told by your health care provider. Do not stop taking the antibiotic even if you start to feel better. General instructions  Follow instructions from your health care provider about what to eat or drink. When you are allowed to eat, avoid eating or drinking anything that triggers your symptoms.  Do not lift anything that is heavier  than 10 lb (4.5 kg), or the limit that you are told, until your health care provider says that it is safe.  Do not use any products that contain nicotine or tobacco, such as cigarettes and e-cigarettes. If you need help quitting, ask your health care provider.  Keep all follow-up visits as told by your health care provider. This is important. Contact a health care provider if:  Your pain is not controlled with medicine.  You have a fever. Get help right away if:  Your pain moves to another part of your abdomen or to your back.  You continue to have symptoms or you develop new symptoms even with treatment. Summary  Cholecystitis is inflammation of the gallbladder.  The most common  cause of this condition is gallstones. Gallstones can block the tube (duct) that carries bile out of your gallbladder.  Common symptoms are pain in the abdomen, nausea, vomiting, fever, and chills.  This condition is treated with surgery to remove the gallbladder, medicines, fasting, and IV fluids.  Follow your health care provider's instructions for eating and drinking. Avoid eating anything that triggers your symptoms. This information is not intended to replace advice given to you by your health care provider. Make sure you discuss any questions you have with your health care provider. Document Released: 08/19/2005 Document Revised: 12/26/2017 Document Reviewed: 12/26/2017 Elsevier Interactive Patient Education  2019 [employer] Inc[employer]   Endoscopic Retrograde Cholangiopancreatogram, Care After This sheet gives you information about how to care for yourself after your procedure. Your health care provider may also give you more specific instructions. If you have problems or questions, contact your health care provider. What can I expect after the procedure? After the procedure, it is common to have:  Soreness in your throat.  Nausea.  Bloating.  Dizziness.  Tiredness (fatigue). Follow these  instructions at home:   Take over-the-counter and prescription medicines only as told by your health care provider.  Do not drive for 24 hours if you were given a medicine to help you relax (sedative) during your procedure. Have someone stay with you for 24 hours after the procedure.  Return to your normal activities as told by your health care provider. Ask your health care provider what activities are safe for you.  Return to eating what you normally do as soon as you feel well enough or as told by your health care provider.  Keep all follow-up visits as told by your health care provider. This is important. Contact a health care provider if:  You have pain in your abdomen that does not get better with medicine.  You develop signs of infection, such as: ? Chills. ? Feeling unwell. Get help right away if:  You have difficulty swallowing.  You have worsening pain in your throat, chest, or abdomen.  You vomit bright red blood or a substance that looks like coffee grounds.  You have bloody or very black stools.  You have a fever.  You have a sudden increase in swelling (bloating) in your abdomen. Summary  After the procedure, it is common to feel tired and to have some discomfort in your throat.  Contact your health care provider if you have signs of infection--such as chills or feeling unwell--or if you have pain that does not improve with medicine.  Get help right away if you have trouble swallowing, worsening pain, bloody or black vomit, bloody or black stools, a fever, or increased swelling in your abdomen.  Keep all follow-up visits as told by your health care provider. This is important. This information is not intended to replace advice given to you by your health care provider. Make sure you discuss any questions you have with your health care provider. Document Released: 06/09/2013 Document Revised: 07/08/2016 Document Reviewed: 07/08/2016 Elsevier Interactive Patient  Education  2019 [employer]

## 2018-12-31 NOTE — Anesthesia Procedure Notes (Signed)
Procedure Name: Intubation Date/Time: 12/31/2018 3:36 PM Performed by: Mitzie Na, CRNA Pre-anesthesia Checklist: Patient identified, Emergency Drugs available, Suction available, Patient being monitored and Timeout performed Patient Re-evaluated:Patient Re-evaluated prior to induction Oxygen Delivery Method: Circle system utilized Preoxygenation: Pre-oxygenation with 100% oxygen Induction Type: IV induction and Rapid sequence Laryngoscope Size: Mac and 4 Grade View: Grade I Tube type: Oral Tube size: 7.5 mm Number of attempts: 1 Airway Equipment and Method: Stylet Placement Confirmation: ETT inserted through vocal cords under direct vision,  positive ETCO2 and breath sounds checked- equal and bilateral Secured at: 24 cm Tube secured with: Tape Dental Injury: Teeth and Oropharynx as per pre-operative assessment

## 2018-12-31 NOTE — Progress Notes (Signed)
Chole + IOC today.  If + CBD stone, would plan on ERCP tomorrow.

## 2018-12-31 NOTE — Anesthesia Preprocedure Evaluation (Signed)
Anesthesia Evaluation  Patient identified by MRN, date of birth, ID band Patient awake    Reviewed: Allergy & Precautions, NPO status , Patient's Chart, lab work & pertinent test results  Airway Mallampati: II  TM Distance: >3 FB Neck ROM: Full    Dental  (+) Dental Advisory Given   Pulmonary sleep apnea , COPD, former smoker,    breath sounds clear to auscultation       Cardiovascular hypertension, Pt. on medications +CHF and + DVT   Rhythm:Regular Rate:Normal     Neuro/Psych  Neuromuscular disease    GI/Hepatic Neg liver ROS, GERD  ,  Endo/Other  diabetes, Type 2, Oral Hypoglycemic Agents  Renal/GU Renal disease     Musculoskeletal  (+) Arthritis ,   Abdominal   Peds  Hematology  (+) anemia ,   Anesthesia Other Findings   Reproductive/Obstetrics                             Lab Results  Component Value Date   WBC 7.5 12/30/2018   HGB 12.1 (L) 12/30/2018   HCT 39.7 12/30/2018   MCV 93.6 12/30/2018   PLT 225 12/30/2018   Lab Results  Component Value Date   CREATININE 1.04 12/31/2018   BUN 17 12/31/2018   NA 138 12/31/2018   K 3.7 12/31/2018   CL 109 12/31/2018   CO2 22 12/31/2018    Anesthesia Physical Anesthesia Plan  ASA: III  Anesthesia Plan: General   Post-op Pain Management:    Induction: Intravenous  PONV Risk Score and Plan: 2 and Dexamethasone, Ondansetron and Treatment may vary due to age or medical condition  Airway Management Planned: Oral ETT  Additional Equipment:   Intra-op Plan:   Post-operative Plan: Extubation in OR  Informed Consent: I have reviewed the patients History and Physical, chart, labs and discussed the procedure including the risks, benefits and alternatives for the proposed anesthesia with the patient or authorized representative who has indicated his/her understanding and acceptance.     Dental advisory given  Plan Discussed  with: CRNA  Anesthesia Plan Comments:         Anesthesia Quick Evaluation

## 2018-12-31 NOTE — Progress Notes (Signed)
PROGRESS NOTE    Valetta CloseCramer J Ades  JXB:[id number] DOB: 05/26/1941 DOA: 12/28/2018 PCP: Gordan PaymentGrisso, Greg A., MD   Brief Narrative:  HPI On 12/28/2018 by Dr. Earlie LouMohammad Garba Valetta CloseCramer J Arocho is a 78 y.o. male with medical history significant of diastolic dysfunction CHF, bilateral pulmonary emboli status post Coumadin therapy ongoing, nonobstructive coronary artery disease status post cardiac cath in 2014, hypertension, diabetes, hyperlipidemia, prostate disease, GERD who was seen [facility] ER with complaint of progressive weight loss over the past 2 months up to 10 pounds, stool incontinence occasionally.  Patient was seen by his PCP and noted elevation in his liver function tests.  Based on that he was sent to the ER for review.  Initial evaluation in the ER including by ultrasound showed elevated liver function tests.  Patient was evaluated using ultrasound that showed choledocholithiasis with multiple gallstones in the gallbladder.  He has positive Murphy sign but no evidence of acute cholecystitis.  Patient sent over for GI evaluation.  He is on chronic Coumadin with elevated INR so unable to do immediate invasive procedure.  Patient still complains of some nausea and vomiting which he has had on and off.  Mainly stimulated by meals.  This has decreased his appetite which is why he lost the weight.  He has no pain at rest.   Interim history Patient admitted with abdominal pain and found to have cholelithiasis with possible choledocholithiasis.  General surgery consulted and planning for cholecystectomy with IOC today.  Assessment & Plan   Abdominal pain from symptomatic cholelithiasis, possible choledocholithiasis/normal LFTs and hyperbilirubinemia -Patient presented with right upper quadrant abdominal tenderness -GI consulted and appreciated, recommended MRCP (however patient has spinal cord stimulator) -General surgery consulted and appreciated, planning for cholecystectomy plus IOC  today 12/31/2018  -Cardiology was consulted for preoperative assessment and found that patient was low risk and can move forward with surgery. -INR currently 1.4  Diarrhea/fecal incontinence associated with weight loss and nausea, vomiting -GI following -Has been ongoing for several months  History of pulmonary emboli -Coumadin held  Essential hypertension -Hold ACEI and lasix -BP stable  Hyperlipidemia  -continue fenofibrate  BPH -Continue finasteride and tamsulosin  Chronic diastolic CHF  -Currently euvolemic  -trace lower extremity edema -Cardiology consulted and appreciated for pre-operative assessment- patient low risk, no further testing needed. Restart coumadin after surgery and follow up with PCP  Diabetes mellitus, type II -Holding glipizide -Continue insulin sliding scale and CBG monitoring   DVT Prophylaxis  Coumadin held, SCDs  Code Status: Full  Family Communication: None at bedside  Disposition Plan: Admitted. Pending surgery today 12/31/2018. Dispo pending  Consultants General surgery Gastroenterology Cardiology  Procedures  None  Antibiotics   Anti-infectives (From admission, onward)   Start     Dose/Rate Route Frequency Ordered Stop   12/30/18 0600  cefTRIAXone (ROCEPHIN) 2 g in sodium chloride 0.9 % 100 mL IVPB     2 g 200 mL/hr over 30 Minutes Intravenous On call to O.R. 12/29/18 1409 12/31/18 0559      Subjective:   Herbie Draperamer Beveridge seen and examined today.  Patient continues to have some abdominal pain but ready to have surgery.  Denies current nausea or vomiting.  Denies current chest pain, shortness of breath, dizziness or headache.  Objective:   Vitals:   12/30/18 1219 12/30/18 1423 12/30/18 2144 12/31/18 0615  BP:  128/78 128/78 129/66  Pulse:  66 64 62  Resp:  18 16 16   Temp:  98.2 F (36.8  C) 98.6 F (37 C) 98.4 F (36.9 C)  TempSrc:  Oral  Oral  SpO2: 95% 100% 92% 96%  Weight:      Height:        Intake/Output  Summary (Last 24 hours) at 12/31/2018 0853 Last data filed at 12/31/2018 0840 Gross per 24 hour  Intake 2489.92 ml  Output 2000 ml  Net 489.92 ml   Filed Weights   12/28/18 2224  Weight: 99.8 kg   Exam  General: Well developed, well nourished, NAD, appears stated age  HEENT: NCAT, mucous membranes moist.   Neck: Supple  Cardiovascular: S1 S2 auscultated, RRR, +SEM  Respiratory: Clear to auscultation bilaterally   Abdomen: Soft, nontender, nondistended, + bowel sounds  Extremities: warm dry without cyanosis clubbing or edema  Neuro: AAOx3, nonfocal  Psych: Pleasant, appropriate mood and affect  Data Reviewed: I have personally reviewed following labs and imaging studies  CBC: Recent Labs  Lab 12/29/18 0409 12/30/18 0409  WBC 8.5 7.5  NEUTROABS  --  3.8  HGB 11.7* 12.1*  HCT 38.5* 39.7  MCV 93.2 93.6  PLT 226 225   Basic Metabolic Panel: Recent Labs  Lab 12/29/18 0409 12/30/18 0409 12/31/18 0427  NA 139 138 138  K 3.7 3.7 3.7  CL 108 109 109  CO2 GLUCOSE 81 90 106*  BUN CREATININE 1.00 1.03 1.04  CALCIUM 9.0 8.8* 9.0  MG 2.1 2.1  --   PHOS 3.0 3.2  --    GFR: Estimated Creatinine Clearance: 74.2 mL/min (by C-G formula based on SCr of 1.04 mg/dL). Liver Function Tests: Recent Labs  Lab 12/29/18 0409 12/30/18 0409 12/31/18 0427  AST 167* 72* 41  ALT 116* 83* 61*  ALKPHOS 261* 234* 211*  BILITOT 2.8* 1.3* 1.2  PROT 6.3* 6.4* 6.4*  ALBUMIN 3.1* 3.0* 3.1*   Recent Labs  Lab 12/30/18 0409  LIPASE 25   No results for input(s): AMMONIA in the last 168 hours. Coagulation Profile: Recent Labs  Lab 12/29/18 0941 12/30/18 0409 12/31/18 0427  INR 2.3* 1.9* 1.4*   Cardiac Enzymes: No results for input(s): CKTOTAL, CKMB, CKMBINDEX, TROPONINI in the last 168 hours. BNP (last 3 results) No results for input(s): PROBNP in the last 8760 hours. HbA1C: No results for input(s): HGBA1C in the last 72 hours. CBG: Recent Labs   Lab 12/30/18 0747 12/30/18 1142 12/30/18 1631 12/30/18 2255 12/31/18 0829  GLUCAP 90 131* 125* 101* 98   Lipid Profile: No results for input(s): CHOL, HDL, LDLCALC, TRIG, CHOLHDL, LDLDIRECT in the last 72 hours. Thyroid Function Tests: No results for input(s): TSH, T4TOTAL, FREET4, T3FREE, THYROIDAB in the last 72 hours. Anemia Panel: No results for input(s): VITAMINB12, FOLATE, FERRITIN, TIBC, IRON, RETICCTPCT in the last 72 hours. Urine analysis: No results found for: COLORURINE, APPEARANCEUR, LABSPEC, PHURINE, GLUCOSEU, HGBUR, BILIRUBINUR, KETONESUR, PROTEINUR, UROBILINOGEN, NITRITE, LEUKOCYTESUR Sepsis Labs: (procalcitonin:4,lacticidven:4)  ) Recent Results (from the past 240 hour(s))  Surgical PCR screen     Status: None   Collection Time: 12/29/18 12:01 AM  Result Value Ref Range Status   MRSA, PCR NEGATIVE NEGATIVE Final   Staphylococcus aureus NEGATIVE NEGATIVE Final    Comment: (NOTE) The Xpert SA Assay (FDA approved for NASAL specimens in patients 75 years of age and older), is one component of a comprehensive surveillance program. It is not intended to diagnose infection nor to guide or monitor treatment. Performed at [facility], [address] [place],  [place] [address]       Radiology Studies: No results found.   Scheduled Meds: . colchicine  0.6 mg Oral Daily  . DULoxetine  30 mg Oral Daily  . febuxostat  40 mg Oral Daily  . feeding supplement  1 Container Oral TID BM  . feeding supplement (ENSURE ENLIVE)  237 mL Oral Q24H  . fenofibrate  160 mg Oral Daily  . finasteride  5 mg Oral Daily  . insulin aspart  0-9 Units Subcutaneous TID WC  . multivitamin with minerals  1 tablet Oral Daily  . mupirocin ointment  1 application Nasal BID  . oxyCODONE-acetaminophen  1 tablet Oral BID   And  . oxyCODONE  5 mg Oral BID  . pantoprazole  40 mg Oral Daily  . tamsulosin  0.4 mg Oral Daily   Continuous Infusions: . sodium  chloride 75 mL/hr at 12/31/18 0200     LOS: 3 days   Time Spent in minutes   30 minutes  Kyona Chauncey D.O. on 12/31/2018 at 8:53 AM  Between 7am to 7pm - Please see pager noted on [email or web address]  After 7pm go to [email or web address]  And look for the night coverage person covering for me after hours  [facility] Office  [phone]

## 2018-12-31 NOTE — Care Management Important Message (Signed)
Important Message  Patient Details  Name: Brandon [name] MRN: [id number] Date of Birth: May 23, 1941   Medicare Important Message Given:  Yes    Caren Macadam 12/31/2018, 10:44 AMImportant Message  Patient Details  Name: Brandon [name] MRN: [id number] Date of Birth: Sep 12, 1940   Medicare Important Message Given:  Yes    Caren Macadam 12/31/2018, 10:44 AM

## 2018-12-31 NOTE — Anesthesia Postprocedure Evaluation (Signed)
Anesthesia Post Note  Patient: Brandon [name]  Procedure(s) Performed: LAPAROSCOPIC CHOLECYSTECTOMY SINGLE SITE WITH INTRAOPERATIVE CHOLANGIOGRAM, REPAIR OF UMBILICAL HERNIA (N/A )     Patient location during evaluation: PACU Anesthesia Type: General Level of consciousness: awake and alert Pain management: pain level controlled Vital Signs Assessment: post-procedure vital signs reviewed and stable Respiratory status: spontaneous breathing, nonlabored ventilation, respiratory function stable and patient connected to nasal cannula oxygen Cardiovascular status: blood pressure returned to baseline and stable Postop Assessment: no apparent nausea or vomiting Anesthetic complications: no    Last Vitals:  Vitals:   12/31/18 1745 12/31/18 1800  BP: (!) 149/75 (!) 132/45  Pulse: 89 94  Resp: (!) 23 (!) 23  Temp:  36.7 C  SpO2: 97% 95%    Last Pain:  Vitals:   12/31/18 1800  TempSrc:   PainSc: 4                  Kennieth Rad

## 2019-01-01 ENCOUNTER — Inpatient Hospital Stay (HOSPITAL_COMMUNITY): Payer: Medicare HMO

## 2019-01-01 ENCOUNTER — Inpatient Hospital Stay (HOSPITAL_COMMUNITY): Payer: Medicare HMO | Admitting: Anesthesiology

## 2019-01-01 ENCOUNTER — Encounter (HOSPITAL_COMMUNITY): Payer: Self-pay | Admitting: Surgery

## 2019-01-01 ENCOUNTER — Encounter (HOSPITAL_COMMUNITY)
Admission: EM | Disposition: A | Discharge status: Home / Self Care | Payer: Self-pay | Source: Other Acute Inpatient Hospital | Attending: Internal Medicine

## 2019-01-01 HISTORY — PX: REMOVAL OF STONES: SHX5545

## 2019-01-01 HISTORY — PX: SPHINCTEROTOMY: SHX5544

## 2019-01-01 HISTORY — PX: BILIARY STENT PLACEMENT: SHX5538

## 2019-01-01 HISTORY — PX: ERCP: SHX5425

## 2019-01-01 LAB — PROTIME-INR
INR: 1.2 (ref 0.8–1.2)
Prothrombin Time: 14.9 seconds (ref 11.4–15.2)

## 2019-01-01 LAB — GLUCOSE, CAPILLARY
Glucose-Capillary: 126 mg/dL — ABNORMAL HIGH (ref 70–99)
Glucose-Capillary: 151 mg/dL — ABNORMAL HIGH (ref 70–99)
Glucose-Capillary: 170 mg/dL — ABNORMAL HIGH (ref 70–99)
Glucose-Capillary: 127 mg/dL — ABNORMAL HIGH (ref 70–99)
Glucose-Capillary: 185 mg/dL — ABNORMAL HIGH (ref 70–99)

## 2019-01-01 LAB — COMPREHENSIVE METABOLIC PANEL
ALT: 51 U/L — ABNORMAL HIGH (ref 0–44)
AST: 39 U/L (ref 15–41)
Albumin: 2.9 g/dL — ABNORMAL LOW (ref 3.5–5.0)
Alkaline Phosphatase: 178 U/L — ABNORMAL HIGH (ref 38–126)
Anion gap: 8 (ref 5–15)
BUN: 21 mg/dL (ref 8–23)
CO2: 20 mmol/L — ABNORMAL LOW (ref 22–32)
Calcium: 9 mg/dL (ref 8.9–10.3)
Chloride: 106 mmol/L (ref 98–111)
Creatinine, Ser: 1.13 mg/dL (ref 0.61–1.24)
GFR calc Af Amer: >60 mL/min (ref >60)
GFR calc non Af Amer: >60 mL/min (ref >60)
Glucose, Bld: 212 mg/dL — ABNORMAL HIGH (ref 70–99)
Potassium: 4.3 mmol/L (ref 3.5–5.1)
Sodium: 134 mmol/L — ABNORMAL LOW (ref 135–145)
Total Bilirubin: 1.2 mg/dL (ref 0.3–1.2)
Total Protein: 6.3 g/dL — ABNORMAL LOW (ref 6.5–8.1)

## 2019-01-01 LAB — CBC
HCT: 37.6 % — ABNORMAL LOW (ref 39.0–52.0)
Hemoglobin: 11.7 g/dL — ABNORMAL LOW (ref 13.0–17.0)
MCH: 29.4 pg (ref 26.0–34.0)
MCHC: 31.1 g/dL (ref 30.0–36.0)
MCV: 94.5 fL (ref 80.0–100.0)
Platelets: 215 10*3/uL (ref 150–400)
RBC: 3.98 MIL/uL — ABNORMAL LOW (ref 4.22–5.81)
RDW: 15.1 % (ref 11.5–15.5)
WBC: 21.6 10*3/uL — ABNORMAL HIGH (ref 4.0–10.5)
nRBC: 0 % (ref 0.0–0.2)

## 2019-01-01 LAB — LIPASE, BLOOD: Lipase: 23 U/L (ref 11–51)

## 2019-01-01 SURGERY — ERCP, WITH INTERVENTION IF INDICATED
Anesthesia: General

## 2019-01-01 MED ORDER — PROPOFOL 10 MG/ML IV BOLUS
INTRAVENOUS | Status: AC
  Filled 2019-01-01: qty 20

## 2019-01-01 MED ORDER — LACTATED RINGERS IV SOLN
INTRAVENOUS | Status: DC
  Administered 2019-01-01: 09:00:00 1000 mL via INTRAVENOUS

## 2019-01-01 MED ORDER — LACTATED RINGERS IV SOLN
INTRAVENOUS | Status: DC | PRN
  Administered 2019-01-01: 09:00:00 via INTRAVENOUS

## 2019-01-01 MED ORDER — GLUCAGON HCL RDNA (DIAGNOSTIC) 1 MG IJ SOLR
INTRAMUSCULAR | Status: AC
  Filled 2019-01-01: qty 1

## 2019-01-01 MED ORDER — SUCCINYLCHOLINE CHLORIDE 200 MG/10ML IV SOSY
PREFILLED_SYRINGE | INTRAVENOUS | Status: DC | PRN
  Administered 2019-01-01: 140 mg via INTRAVENOUS

## 2019-01-01 MED ORDER — SODIUM CHLORIDE 0.9 % IV SOLN: 2.0000 g | INTRAVENOUS | Status: DC

## 2019-01-01 MED ORDER — CIPROFLOXACIN IN D5W 400 MG/200ML IV SOLN
INTRAVENOUS | Status: DC | PRN
  Administered 2019-01-01: 400 mg via INTRAVENOUS

## 2019-01-01 MED ORDER — FENTANYL CITRATE (PF) 100 MCG/2ML IJ SOLN
INTRAMUSCULAR | Status: DC | PRN
  Administered 2019-01-01: 100 ug via INTRAVENOUS

## 2019-01-01 MED ORDER — FENTANYL CITRATE (PF) 100 MCG/2ML IJ SOLN
INTRAMUSCULAR | Status: AC
  Filled 2019-01-01: qty 2

## 2019-01-01 MED ORDER — INDOMETHACIN 50 MG RE SUPP
RECTAL | Status: DC | PRN
  Administered 2019-01-01: 100 mg via RECTAL

## 2019-01-01 MED ORDER — SUGAMMADEX SODIUM 500 MG/5ML IV SOLN
INTRAVENOUS | Status: DC | PRN
  Administered 2019-01-01: 250 mg via INTRAVENOUS

## 2019-01-01 MED ORDER — GLUCAGON HCL RDNA (DIAGNOSTIC) 1 MG IJ SOLR
INTRAMUSCULAR | Status: DC | PRN
  Administered 2019-01-01 (×3): .5 mg via INTRAVENOUS

## 2019-01-01 MED ORDER — INDOMETHACIN 50 MG RE SUPP
RECTAL | Status: AC
  Filled 2019-01-01: qty 2

## 2019-01-01 MED ORDER — ROCURONIUM BROMIDE 10 MG/ML (PF) SYRINGE
PREFILLED_SYRINGE | INTRAVENOUS | Status: DC | PRN
  Administered 2019-01-01: 50 mg via INTRAVENOUS

## 2019-01-01 MED ORDER — SODIUM CHLORIDE 0.9 % IV SOLN
2.0000 g | INTRAVENOUS | Status: DC
  Administered 2019-01-01: 2 g via INTRAVENOUS
  Filled 2019-01-01 (×2): qty 20

## 2019-01-01 MED ORDER — PROPOFOL 10 MG/ML IV BOLUS
INTRAVENOUS | Status: DC | PRN
  Administered 2019-01-01: 140 mg via INTRAVENOUS

## 2019-01-01 MED ORDER — DEXAMETHASONE SODIUM PHOSPHATE 10 MG/ML IJ SOLN
INTRAMUSCULAR | Status: DC | PRN
  Administered 2019-01-01: 5 mg via INTRAVENOUS

## 2019-01-01 MED ORDER — LIDOCAINE 2% (20 MG/ML) 5 ML SYRINGE
INTRAMUSCULAR | Status: DC | PRN
  Administered 2019-01-01: 50 mg via INTRAVENOUS

## 2019-01-01 MED ORDER — CIPROFLOXACIN IN D5W 400 MG/200ML IV SOLN
INTRAVENOUS | Status: AC
  Filled 2019-01-01: qty 200

## 2019-01-01 MED ORDER — SODIUM CHLORIDE 0.9 % IV SOLN
INTRAVENOUS | Status: DC | PRN
  Administered 2019-01-01: 30 mL

## 2019-01-01 MED ORDER — MIDAZOLAM HCL 2 MG/2ML IJ SOLN
INTRAMUSCULAR | Status: AC
  Filled 2019-01-01: qty 2

## 2019-01-01 MED ORDER — ONDANSETRON HCL 4 MG/2ML IJ SOLN
INTRAMUSCULAR | Status: DC | PRN
  Administered 2019-01-01: 4 mg via INTRAVENOUS

## 2019-01-01 NOTE — Anesthesia Procedure Notes (Signed)
Procedure Name: Intubation Date/Time: 01/01/2019 9:14 AM Performed by: Lissa Morales, CRNA Pre-anesthesia Checklist: Patient identified, Emergency Drugs available, Suction available and Patient being monitored Patient Re-evaluated:Patient Re-evaluated prior to induction Oxygen Delivery Method: Circle system utilized Preoxygenation: Pre-oxygenation with 100% oxygen Induction Type: IV induction and Rapid sequence Laryngoscope Size: Mac and 4 Grade View: Grade I Tube type: Oral Tube size: 7.5 mm Number of attempts: 1 Airway Equipment and Method: Stylet and Oral airway Placement Confirmation: ETT inserted through vocal cords under direct vision,  positive ETCO2 and breath sounds checked- equal and bilateral Secured at: 23 cm Tube secured with: Tape Dental Injury: Teeth and Oropharynx as per pre-operative assessment

## 2019-01-01 NOTE — Progress Notes (Signed)
Pt came out of procedure with brusing and tear on left eyelid and under left eye. Dr Noreene Larsson and Dr. Dulce Sellar both have seen pt regarding his brusing on his eye. Ice was recommended.

## 2019-01-01 NOTE — Progress Notes (Signed)
PROGRESS NOTE    Brandon [name]  JYN:[id number] DOB: 1941/04/03 DOA: 12/28/2018 PCP: Gordan Payment., MD   Brief Narrative:  HPI On 12/28/2018 by Dr. Earlie Lou Brandon [name] is a 78 y.o. male with medical history significant of diastolic dysfunction CHF, bilateral pulmonary emboli status post Coumadin therapy ongoing, nonobstructive coronary artery disease status post cardiac cath in 2014, hypertension, diabetes, hyperlipidemia, prostate disease, GERD who was seen at [facility] ER with complaint of progressive weight loss over the past 2 months up to 10 pounds, stool incontinence occasionally.  Patient was seen by his PCP and noted elevation in his liver function tests.  Based on that he was sent to the ER for review.  Initial evaluation in the ER including by ultrasound showed elevated liver function tests.  Patient was evaluated using ultrasound that showed choledocholithiasis with multiple gallstones in the gallbladder.  He has positive Murphy sign but no evidence of acute cholecystitis.  Patient sent over for GI evaluation.  He is on chronic Coumadin with elevated INR so unable to do immediate invasive procedure.  Patient still complains of some nausea and vomiting which he has had on and off.  Mainly stimulated by meals.  This has decreased his appetite which is why he lost the weight.  He has no pain at rest.   Interim history Patient admitted with abdominal pain and found to have cholelithiasis with possible choledocholithiasis.  General surgery consulted and planning for cholecystectomy with IOC today.  Assessment & Plan   Abdominal pain from symptomatic cholelithiasis, possible choledocholithiasis/normal LFTs and hyperbilirubinemia -Patient presented with right upper quadrant abdominal tenderness -GI consulted and appreciated, recommended MRCP (however patient has spinal cord stimulator) -Cardiology was consulted for preoperative assessment and found that patient was low  risk and can move forward with surgery. -General surgery consulted and appreciated status post single site laparoscopic cholecystectomy with intraoperative cholangiogram, primary umbilical hernia repair -ERCP planned for today  Leukocytosis -WBC up to 21.6, suspect secondary to recent surgery -Patient currently afebrile -Continue to monitor CBC  Diarrhea/fecal incontinence associated with weight loss and nausea, vomiting -GI following -Has been ongoing for several months  History of pulmonary emboli -Coumadin held, INR currently 1.2  Essential hypertension -Hold ACEI and lasix -BP stable  Hyperlipidemia  -continue fenofibrate  BPH -Continue finasteride and tamsulosin  Chronic diastolic CHF  -Currently euvolemic  -trace lower extremity edema -Cardiology consulted and appreciated for pre-operative assessment- patient low risk, no further testing needed. Restart coumadin after surgery and follow up with PCP  Diabetes mellitus, type II -Holding glipizide -Continue insulin sliding scale and CBG monitoring   DVT Prophylaxis  Coumadin held, SCDs  Code Status: Full  Family Communication: None at bedside  Disposition Plan: Admitted.  Pending ERCP today.  Position pending further surgical and gastroenterology recommendations.  Suspect home when stable  Consultants General surgery Gastroenterology Cardiology  Procedures  single site laparoscopic cholecystectomy with intraoperative cholangiogram, primary umbilical hernia repair  Antibiotics   Anti-infectives (From admission, onward)   Start     Dose/Rate Route Frequency Ordered Stop   01/01/19 0600  cefTRIAXone (ROCEPHIN) 2 g in sodium chloride 0.9 % 100 mL IVPB     2 g 200 mL/hr over 30 Minutes Intravenous On call to O.R. 12/31/18 1515 12/31/18 1543   12/31/18 1304  sodium chloride 0.9 % with cefTRIAXone (ROCEPHIN) ADS Med    Note to Pharmacy:  Kathe Becton   : cabinet override      12/31/18 1304  12/31/18 1543    12/30/18 0600  cefTRIAXone (ROCEPHIN) 2 g in sodium chloride 0.9 % 100 mL IVPB     2 g 200 mL/hr over 30 Minutes Intravenous On call to O.R. 12/29/18 1409 12/31/18 0559      Subjective:   Brandon [name] seen and examined today.  Patient continues to have some abdominal pain and soreness.  He currently denies any nausea or vomiting.  Denies chest pain, shortness of breath, dizziness or headache.  Objective:   Vitals:   01/01/19 0126 01/01/19 0512 01/01/19 0516 01/01/19 0819  BP: 122/62 119/75  138/78  Pulse: 77 63  64  Resp: 20 20  18   Temp: 97.9 F (36.6 C)  97.9 F (36.6 C) 98.4 F (36.9 C)  TempSrc: Oral  Oral Oral  SpO2: 92% 93%  94%  Weight:    99.8 kg  Height:    6\' 4"  (1.93 m)    Intake/Output Summary (Last 24 hours) at 01/01/2019 0912 Last data filed at 01/01/2019 [id number] Gross per 24 hour  Intake 3743.95 ml  Output 920 ml  Net 2823.95 ml   Filed Weights   12/28/18 2224 01/01/19 0819  Weight: 99.8 kg 99.8 kg   Exam  General: Well developed, well nourished, NAD, appears stated age  HEENT: NCAT, mucous membranes moist.   Neck: Supple  Cardiovascular: S1 S2 auscultated, 2/6 SEM, RRR  Respiratory: Clear to auscultation bilaterally with equal chest rise  Abdomen: Soft, appropriately tender,, nondistended, + bowel sounds  Extremities: warm dry without cyanosis clubbing or edema  Neuro: AAOx3, nonfocal  Psych: Pleasant, appropriate mood and affect  Data Reviewed: I have personally reviewed following labs and imaging studies  CBC: Recent Labs  Lab 12/29/18 0409 12/30/18 0409 01/01/19 0404  WBC 8.5 7.5 21.6*  NEUTROABS  --  3.8  --   HGB 11.7* 12.1* 11.7*  HCT 38.5* 39.7 37.6*  MCV 93.2 93.6 94.5  PLT 226 225 215   Basic Metabolic Panel: Recent Labs  Lab 12/29/18 0409 12/30/18 0409 12/31/18 0427 01/01/19 0404  NA 139 138 138 134*  K 3.7 3.7 3.7 4.3  CL 108 109 109 106  CO2 24 22 22  20*  GLUCOSE 81 90 106* 212*  BUN 14 15 17 21   CREATININE  1.00 1.03 1.04 1.13  CALCIUM 9.0 8.8* 9.0 9.0  MG 2.1 2.1  --   --   PHOS 3.0 3.2  --   --    GFR: Estimated Creatinine Clearance: 68.3 mL/min (by C-G formula based on SCr of 1.13 mg/dL). Liver Function Tests: Recent Labs  Lab 12/29/18 0409 12/30/18 0409 12/31/18 0427 01/01/19 0404  AST 167* 72* 41 39  ALT 116* 83* 61* 51*  ALKPHOS 261* 234* 211* 178*  BILITOT 2.8* 1.3* 1.2 1.2  PROT 6.3* 6.4* 6.4* 6.3*  ALBUMIN 3.1* 3.0* 3.1* 2.9*   Recent Labs  Lab 12/30/18 0409 01/01/19 0404  LIPASE 25 23   No results for input(s): AMMONIA in the last 168 hours. Coagulation Profile: Recent Labs  Lab 12/29/18 0941 12/30/18 0409 12/31/18 0427 01/01/19 0404  INR 2.3* 1.9* 1.4* 1.2   Cardiac Enzymes: No results for input(s): CKTOTAL, CKMB, CKMBINDEX, TROPONINI in the last 168 hours. BNP (last 3 results) No results for input(s): PROBNP in the last 8760 hours. HbA1C: No results for input(s): HGBA1C in the last 72 hours. CBG: Recent Labs  Lab 12/31/18 1146 12/31/18 1711 12/31/18 2133 01/01/19 0740 01/01/19 0818  GLUCAP 94 157* 155* 151* 126*  Lipid Profile: No results for input(s): CHOL, HDL, LDLCALC, TRIG, CHOLHDL, LDLDIRECT in the last 72 hours. Thyroid Function Tests: No results for input(s): TSH, T4TOTAL, FREET4, T3FREE, THYROIDAB in the last 72 hours. Anemia Panel: No results for input(s): VITAMINB12, FOLATE, FERRITIN, TIBC, IRON, RETICCTPCT in the last 72 hours. Urine analysis: No results found for: COLORURINE, APPEARANCEUR, LABSPEC, PHURINE, GLUCOSEU, HGBUR, BILIRUBINUR, KETONESUR, PROTEINUR, UROBILINOGEN, NITRITE, LEUKOCYTESUR Sepsis Labs: @LABRCNTIP (procalcitonin:4,lacticidven:4)  ) Recent Results (from the past 240 hour(s))  Surgical PCR screen     Status: None   Collection Time: 12/29/18 12:01 AM  Result Value Ref Range Status   MRSA, PCR NEGATIVE NEGATIVE Final   Staphylococcus aureus NEGATIVE NEGATIVE Final    Comment: (NOTE) The Xpert SA Assay (FDA  approved for NASAL specimens in patients 13 years of age and older), is one component of a comprehensive surveillance program. It is not intended to diagnose infection nor to guide or monitor treatment. Performed at [facility], [address] [place], [place] [address]       Radiology Studies: Dg Cholangiogram Operative  Result Date: 12/31/2018 CLINICAL DATA:  78 year old male with cholelithiasis EXAM: INTRAOPERATIVE CHOLANGIOGRAM TECHNIQUE: Cholangiographic images from the C-arm fluoroscopic device were submitted for interpretation post-operatively. Please see the procedural report for the amount of contrast and the fluoroscopy time utilized. COMPARISON:  12/28/2018 FINDINGS: Surgical instruments project over the upper abdomen. There is cannulation of the cystic duct/gallbladder neck, with antegrade infusion of contrast. Caliber of the extrahepatic ductal system within normal limits. Ill-defined filling defects within the common bile duct with a rounded filling defect in the distal common bile duct. No contrast traverses the ampulla. IMPRESSION: Intraoperative cholangiogram demonstrates evidence of choledocholithiasis, and no contrast traversing the ampulla. Please refer to the dictated operative report for full details of intraoperative findings and procedure Electronically Signed   By: Gilmer Mor D.O.   On: 12/31/2018 16:56     Scheduled Meds: . [MAR Hold] colchicine  0.6 mg Oral Daily  . [MAR Hold] DULoxetine  30 mg Oral Daily  . [MAR Hold] febuxostat  40 mg Oral Daily  . [MAR Hold] feeding supplement  1 Container Oral TID BM  . [MAR Hold] feeding supplement (ENSURE ENLIVE)  237 mL Oral Q24H  . [MAR Hold] fenofibrate  160 mg Oral Daily  . [MAR Hold] finasteride  5 mg Oral Daily  . [MAR Hold] insulin aspart  0-9 Units Subcutaneous TID WC  . [MAR Hold] multivitamin with minerals  1 tablet Oral Daily  . [MAR Hold] mupirocin ointment  1 application Nasal BID  .  [MAR Hold] oxyCODONE-acetaminophen  1 tablet Oral BID   And  . [MAR Hold] oxyCODONE  5 mg Oral BID  . [MAR Hold] pantoprazole  40 mg Oral Daily  . [MAR Hold] tamsulosin  0.4 mg Oral Daily   Continuous Infusions: . sodium chloride 75 mL/hr at 12/31/18 2138  . lactated ringers 1,000 mL (01/01/19 0856)     LOS: 4 days   Time Spent in minutes   30 minutes  Adem Costlow D.O. on 01/01/2019 at 9:12 AM  Between 7am to 7pm - Please see pager noted on [email or web address]  After 7pm go to [email or web address]  And look for the night coverage person covering for me after hours  [facility] Office  [phone]

## 2019-01-01 NOTE — Interval H&P Note (Signed)
History and Physical Interval Note:  01/01/2019 8:52 AM  Brandon [name]  has presented today for surgery, with the diagnosis of common bile duct stone.  The various methods of treatment have been discussed with the patient and family. After consideration of risks, benefits and other options for treatment, the patient has consented to  Procedure(s): ENDOSCOPIC RETROGRADE CHOLANGIOPANCREATOGRAPHY (ERCP) (N/A) as a surgical intervention.  The patient's history has been reviewed, patient examined, no change in status, stable for surgery.  I have reviewed the patient's chart and labs.  Questions were answered to the patient's satisfaction.     [name]  Assessment:  1.  Bile duct stones. 2.  Right sided abdominal pain, from choledocholthiasis? 3.  Elevated liver enzymes, downtrending.  Plan:  1.  ERCP for anticipated biliary sphincterotomy and stone extraction. 2.  Risks (up to and including bleeding, infection, perforation, pancreatitis that can be complicated by infected necrosis and death), benefits (removal of stones, alleviating blockage, decreasing risk of cholangitis or choledocholithiasis-related pancreatitis), and alternatives (watchful waiting, percutaneous transhepatic cholangiography) of ERCP were explained to patient/family in detail and patient elects to proceed.

## 2019-01-01 NOTE — Anesthesia Preprocedure Evaluation (Addendum)
Anesthesia Evaluation  Patient identified by MRN, date of birth, ID band  Reviewed: Allergy & Precautions, NPO status , Patient's Chart, lab work & pertinent test results  Airway Mallampati: II  TM Distance: >3 FB Neck ROM: Full    Dental   Pulmonary former smoker,    breath sounds clear to auscultation       Cardiovascular hypertension,  Rhythm:Regular Rate:Normal     Neuro/Psych    GI/Hepatic   Endo/Other  diabetes  Renal/GU      Musculoskeletal   Abdominal   Peds  Hematology   Anesthesia Other Findings   Reproductive/Obstetrics                             Anesthesia Physical Anesthesia Plan  ASA: III  Anesthesia Plan: General   Post-op Pain Management:    Induction: Intravenous, Cricoid pressure planned and Rapid sequence  PONV Risk Score and Plan: Ondansetron  Airway Management Planned: Oral ETT  Additional Equipment:   Intra-op Plan:   Post-operative Plan: Extubation in OR  Informed Consent: I have reviewed the patients History and Physical, chart, labs and discussed the procedure including the risks, benefits and alternatives for the proposed anesthesia with the patient or authorized representative who has indicated his/her understanding and acceptance.     Dental advisory given  Plan Discussed with: CRNA and Anesthesiologist  Anesthesia Plan Comments:         Anesthesia Quick Evaluation

## 2019-01-01 NOTE — Op Note (Signed)
[facility] Patient Name: Brandon [name] Procedure Date: 01/01/2019 MRN: [id number] Attending MD: Willis Modena , MD Date of Birth: 03/29/1941 CSN: [id number] Age: 79 Admit Type: Inpatient Procedure:                ERCP Indications:              Bile duct stone(s), Abdominal pain of suspected                            biliary origin, Elevated liver enzymes Providers:                Willis Modena, MD, Dwain Sarna, RN, Beryle Beams, Technician Referring MD:             Dr. Karie Soda [facility]) Medicines:                General Anesthesia, Indomethacin 100 mg PR, Cipro                            400 mg IV Complications:            Small cut and bruising on left eyelid and inferior                            periorbital tissue. Estimated Blood Loss:     Estimated blood loss was minimal. Procedure:                Pre-Anesthesia Assessment:                           - Prior to the procedure, a History and Physical                            was performed, and patient medications and                            allergies were reviewed. The patient's tolerance of                            previous anesthesia was also reviewed. The risks                            and benefits of the procedure and the sedation                            options and risks were discussed with the patient.                            All questions were answered, and informed consent                            was obtained. Prior Anticoagulants: The patient has  taken Coumadin (warfarin), last dose was 5 days                            prior to procedure. ASA Grade Assessment: III - A                            patient with severe systemic disease. After                            reviewing the risks and benefits, the patient was                            deemed in satisfactory condition to undergo the            procedure.                           After obtaining informed consent, the scope was                            passed under direct vision. Throughout the                            procedure, the patient's blood pressure, pulse, and                            oxygen saturations were monitored continuously. The                            [id number]-[id number] ([id number] Olympus duodenoscope was                            introduced through the mouth, and used to inject                            contrast into and used to inject contrast into the                            bile duct. The ERCP was accomplished without                            difficulty. The patient tolerated the procedure                            well. Scope In: Scope Out: Findings:      A scout film of the abdomen was obtained. Surgical clips, consistent       with a previous cholecystectomy, were seen in the area of the right       upper quadrant of the abdomen. The major papilla was normal. A wire was       passed into the biliary tree. An 8 mm biliary sphincterotomy was made       with a traction (standard) sphincterotome using blended current. There       was no post-sphincterotomy bleeding. Cholangiogram showed       post-cholecystectomy anatomy  without bile leak. Bile duct 8-10 mm in       diameter. The common bile duct contained multiple stones, the largest of       which was 10 mm in diameter. The biliary tree was swept with a 12 mm       balloon starting at the upper third of the main bile duct, middle third       of the main bile duct and lower third of the main duct. Three stones       were removed, about 4-6 mm in diameter. Two stones remained. Tried to       remove these stones with lithotripsy basket, but this was not       successful. Stones remaining about 10mm in size and are square-shaped       and could not remove. One 10 Fr by 7 cm plastic stent with a single       external flap and a single internal  flap was placed into the common bile       duct. Bile flowed through the stent. The stent was in good position.       Pancreatogram was not obtained (intentionally). Impression:               - The major papilla appeared normal.                           - Choledocholithiasis was found. Partial removal                            was accomplished with biliary sphincterotomy; a                            stent was inserted.                           - A biliary sphincterotomy was performed.                           - The biliary tree was swept.                           - One plastic stent was placed into the common bile                            duct. Moderate Sedation:      None Recommendation:           - Avoid aspirin and nonsteroidal anti-inflammatory                            medicines indefinitely.                           - Resume Coumadin (warfarin) at prior dose in 3                            days.                           - Will need outpatient repeat ERCP in 4-6  weeks                            with stent removal and cholangioscopy (Spyglass)                            with possible electrohydraulic lithotripsy (EHL).                           [facility] will follow. Maybe home tomorrow, if no                            immediate post-ERCP complications. Procedure Code(s):        --- Professional ---                           [phone], Endoscopic retrograde                            cholangiopancreatography (ERCP); with placement of                            endoscopic stent into biliary or pancreatic duct,                            including pre- and post-dilation and guide wire                            passage, when performed, including sphincterotomy,                            when performed, each stent                           [id number], Endoscopic retrograde                            cholangiopancreatography (ERCP); with removal of                             calculi/debris from biliary/pancreatic duct(s) Diagnosis Code(s):        --- Professional ---                           K80.50, Calculus of bile duct without cholangitis                            or cholecystitis without obstruction                           R10.9, Unspecified abdominal pain                           R74.8, Abnormal levels of other serum enzymes CPT copyright 2019 [employer]. All rights reserved. The codes documented in this report are preliminary and upon coder review may  be revised to meet current compliance requirements. Willis Modena,  MD 01/01/2019 10:49:00 AM This report has been signed electronically. Number of Addenda: 0

## 2019-01-01 NOTE — Transfer of Care (Signed)
Immediate Anesthesia Transfer of Care Note  Patient: Valetta Close  Procedure(s) Performed: ENDOSCOPIC RETROGRADE CHOLANGIOPANCREATOGRAPHY (ERCP) (N/A ) SPHINCTEROTOMY BILIARY STENT PLACEMENT (N/A ) REMOVAL OF STONES  Patient Location: PACU  Anesthesia Type:General  Level of Consciousness: awake, alert , oriented and patient cooperative  Airway & Oxygen Therapy: Patient Spontanous Breathing and Patient connected to face mask oxygen  Post-op Assessment: Report given to RN, Post -op Vital signs reviewed and stable and Patient moving all extremities X 4  Post vital signs: stable  Last Vitals:  Vitals Value Taken Time  BP 132/71 01/01/2019 10:50 AM  Temp    Pulse 56 01/01/2019 10:56 AM  Resp 19 01/01/2019 10:56 AM  SpO2 95 % 01/01/2019 10:56 AM  Vitals shown include unvalidated device data.  Last Pain:  Vitals:   01/01/19 1042  TempSrc: Oral  PainSc: 4       Patients Stated Pain Goal: 3 (01/01/19 0750)  Complications: Pts left eye bruised above and below from pressure on th get head rest. Dr. Noreene Larsson , Dr. Dulce Sellar aware and pt aware . Will put ice one eye in PACU

## 2019-01-01 NOTE — Anesthesia Postprocedure Evaluation (Signed)
Anesthesia Post Note  Patient: Brandon [name]  Procedure(s) Performed: ENDOSCOPIC RETROGRADE CHOLANGIOPANCREATOGRAPHY (ERCP) (N/A ) SPHINCTEROTOMY BILIARY STENT PLACEMENT (N/A ) REMOVAL OF STONES     Patient location during evaluation: PACU Anesthesia Type: General Level of consciousness: awake and alert Pain management: pain level controlled Vital Signs Assessment: post-procedure vital signs reviewed and stable Respiratory status: spontaneous breathing, nonlabored ventilation, respiratory function stable and patient connected to nasal cannula oxygen Cardiovascular status: blood pressure returned to baseline and stable Postop Assessment: no apparent nausea or vomiting Anesthetic complications: no    Last Vitals:  Vitals:   01/01/19 1100 01/01/19 1411  BP: (!) 143/67 135/73  Pulse: (!) 56 (!) 59  Resp: 17 17  Temp:  36.9 C  SpO2: 95% 96%    Last Pain:  Vitals:   01/01/19 1411  TempSrc: Oral  PainSc:                  Arabela Basaldua COKER

## 2019-01-01 NOTE — Progress Notes (Signed)
1 Day Post-Op    CC:  Weight loss, stool incontience, LFT elevation  Subjective: Going down for ERCP this AM.  He underwent ERCP this morning and 3 stones were removed.  2 stones were remaining were not able to be removed.  A stent was placed by Dr. Dulce Sellarutlaw.  He is back in his room now and is feeling better although still pretty sore.  He has 2 black eyes which I think are new.  Objective: Vital signs in last 24 hours: Temp:  [97.7 F (36.5 C)-98.4 F (36.9 C)] 97.9 F (36.6 C) (05/01 0516) Pulse Rate:  [63-94] 63 (05/01 0512) Resp:  [13-24] 20 (05/01 0512) BP: (119-149)/(45-102) 119/75 (05/01 0512) SpO2:  [92 %-100 %] 93 % (05/01 0512) Last BM Date: 12/29/18 P.o. 237 IV 3500 Urine 1100 Afebrile vital signs are stable CMP looks good LFTs continue to improve. WBC 21.6 Hemoglobin stable at 11.7. Intake/Output from previous day: 04/30 0701 - 05/01 0700 In: 3744 [P.O.:237; I.V.:3007; IV Piggyback:500] Out: 1120 [Urine:1100; Blood:20] Intake/Output this shift: No intake/output data recorded.  General appearance: alert, cooperative, no distress and Feeling better overall. GI: Soft, sore, port sites look fine.  Lab Results:  Recent Labs    12/30/18 0409 01/01/19 0404  WBC 7.5 21.6*  HGB 12.1* 11.7*  HCT 39.7 37.6*  PLT 225 215    BMET Recent Labs    12/31/18 0427 01/01/19 0404  NA 138 134*  K 3.7 4.3  CL 109 106  CO2 22 20*  GLUCOSE 106* 212*  BUN 17 21  CREATININE 1.04 1.13  CALCIUM 9.0 9.0   PT/INR Recent Labs    12/31/18 0427 01/01/19 0404  LABPROT 17.1* 14.9  INR 1.4* 1.2    Recent Labs  Lab 12/29/18 0409 12/30/18 0409 12/31/18 0427 01/01/19 0404  AST 167* 72* 41 39  ALT 116* 83* 61* 51*  ALKPHOS 261* 234* 211* 178*  BILITOT 2.8* 1.3* 1.2 1.2  PROT 6.3* 6.4* 6.4* 6.3*  ALBUMIN 3.1* 3.0* 3.1* 2.9*     Lipase     Component Value Date/Time   LIPASE 23 01/01/2019 0404     Medications: . colchicine  0.6 mg Oral Daily  . DULoxetine  30  mg Oral Daily  . febuxostat  40 mg Oral Daily  . feeding supplement  1 Container Oral TID BM  . feeding supplement (ENSURE ENLIVE)  237 mL Oral Q24H  . fenofibrate  160 mg Oral Daily  . finasteride  5 mg Oral Daily  . insulin aspart  0-9 Units Subcutaneous TID WC  . multivitamin with minerals  1 tablet Oral Daily  . mupirocin ointment  1 application Nasal BID  . oxyCODONE-acetaminophen  1 tablet Oral BID   And  . oxyCODONE  5 mg Oral BID  . pantoprazole  40 mg Oral Daily  . tamsulosin  0.4 mg Oral Daily   . 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 Karie SodaSteven Gross  FEN:IV fluids/NPO IO:[id number]:Rocephin 4/30 >> day 2 DVT: SCD - off anticoagulant for procedures [facility] P[facility]: Wife Darci CurrentWanda Carrigan- [phone]- [phone]   Plan: From our standpoint patient is doing well he probably go home  when okay with GI.  WBC was up we will recheck his labs in the morning I would keep him on antibiotics until his white count is at least down to normal.  Discharge information, and follow-up are in the AVS for our service.      LOS: 4 days    Cherie Lasalle 01/01/2019 [phone]

## 2019-01-02 DIAGNOSIS — K219 Gastro-esophageal reflux disease without esophagitis: Secondary | ICD-10-CM

## 2019-01-02 DIAGNOSIS — R269 Unspecified abnormalities of gait and mobility: Secondary | ICD-10-CM

## 2019-01-02 DIAGNOSIS — Z6830 Body mass index (BMI) 30.0-30.9, adult: Secondary | ICD-10-CM

## 2019-01-02 DIAGNOSIS — E6609 Other obesity due to excess calories: Secondary | ICD-10-CM

## 2019-01-02 DIAGNOSIS — Z86711 Personal history of pulmonary embolism: Secondary | ICD-10-CM

## 2019-01-02 LAB — COMPREHENSIVE METABOLIC PANEL
ALT: 37 U/L (ref 0–44)
AST: 24 U/L (ref 15–41)
Albumin: 2.8 g/dL — ABNORMAL LOW (ref 3.5–5.0)
Alkaline Phosphatase: 145 U/L — ABNORMAL HIGH (ref 38–126)
Anion gap: 6 (ref 5–15)
BUN: 29 mg/dL — ABNORMAL HIGH (ref 8–23)
CO2: 24 mmol/L (ref 22–32)
Calcium: 8.6 mg/dL — ABNORMAL LOW (ref 8.9–10.3)
Chloride: 105 mmol/L (ref 98–111)
Creatinine, Ser: 1.13 mg/dL (ref 0.61–1.24)
GFR calc Af Amer: >60 mL/min (ref >60)
GFR calc non Af Amer: >60 mL/min (ref >60)
Glucose, Bld: 125 mg/dL — ABNORMAL HIGH (ref 70–99)
Potassium: 4.5 mmol/L (ref 3.5–5.1)
Sodium: 135 mmol/L (ref 135–145)
Total Bilirubin: 1.1 mg/dL (ref 0.3–1.2)
Total Protein: 6 g/dL — ABNORMAL LOW (ref 6.5–8.1)

## 2019-01-02 LAB — CBC
HCT: 33.6 % — ABNORMAL LOW (ref 39.0–52.0)
Hemoglobin: 10.5 g/dL — ABNORMAL LOW (ref 13.0–17.0)
MCH: 29.3 pg (ref 26.0–34.0)
MCHC: 31.3 g/dL (ref 30.0–36.0)
MCV: 93.9 fL (ref 80.0–100.0)
Platelets: 205 10*3/uL (ref 150–400)
RBC: 3.58 MIL/uL — ABNORMAL LOW (ref 4.22–5.81)
RDW: 15 % (ref 11.5–15.5)
WBC: 18.3 10*3/uL — ABNORMAL HIGH (ref 4.0–10.5)
nRBC: 0 % (ref 0.0–0.2)

## 2019-01-02 LAB — PROTIME-INR
INR: 1.3 — ABNORMAL HIGH (ref 0.8–1.2)
Prothrombin Time: 16.3 seconds — ABNORMAL HIGH (ref 11.4–15.2)

## 2019-01-02 LAB — GLUCOSE, CAPILLARY
Glucose-Capillary: 110 mg/dL — ABNORMAL HIGH (ref 70–99)
Glucose-Capillary: 105 mg/dL — ABNORMAL HIGH (ref 70–99)

## 2019-01-02 MED ORDER — ADULT MULTIVITAMIN W/MINERALS CH: 1.0000 | ORAL_TABLET | Freq: Every day | ORAL | Status: DC

## 2019-01-02 MED ORDER — AMOXICILLIN-POT CLAVULANATE 875-125 MG PO TABS: 1.0000 | ORAL_TABLET | Freq: Two times a day (BID) | ORAL | 0 refills | Status: AC

## 2019-01-02 MED ORDER — ENSURE ENLIVE PO LIQD: 237.0000 mL | ORAL | Status: DC

## 2019-01-02 MED ORDER — WARFARIN SODIUM 5 MG PO TABS: 5.0000 mg | ORAL_TABLET | Freq: Every day | ORAL | Status: AC

## 2019-01-02 NOTE — Progress Notes (Signed)
[facility] Gastroenterology Progress Note  Brandon [name] 78 y.o. 1941-07-13  CC: CBD stones   Subjective: Status post ERCP yesterday.  Procedure report reviewed.  Not able to remove  large square stones.  Plastic stent was placed.  Is complaining of mild abdominal soreness but denies any acute abdominal pain, nausea or vomiting.  ROS : Febrile.  Negative for chest pain and shortness of breath.   Objective: Vital signs in last 24 hours: Vitals:   01/01/19 2208 01/02/19 0554  BP: 130/61 132/73  Pulse: 70 62  Resp: 18 16  Temp: 98 F (36.7 C) 98.4 F (36.9 C)  SpO2: 94% 94%    Physical Exam:  General:  Alert, cooperative, no distress, appears stated age  Head:  Normocephalic, without obvious abnormality, atraumatic     Lungs:   Clear to auscultation bilaterally, respirations unlabored  Heart:  Regular rate and rhythm, S1, S2 normal  Abdomen:   Soft, non-tender, bowel sounds distant.  No peritoneal signs.  Nondistended  Extremities: Extremities normal, atraumatic, no  edema       Lab Results: Recent Labs    01/01/19 0404 01/02/19 0342  NA 134* 135  K 4.3 4.5  CL 106 105  CO2 20* 24  GLUCOSE 212* 125*  BUN 21 29*  CREATININE 1.13 1.13  CALCIUM 9.0 8.6*   Recent Labs    01/01/19 0404 01/02/19 0342  AST 39 24  ALT 51* 37  ALKPHOS 178* 145*  BILITOT 1.2 1.1  PROT 6.3* 6.0*  ALBUMIN 2.9* 2.8*   Recent Labs    01/01/19 0404 01/02/19 0342  WBC 21.6* 18.3*  HGB 11.7* 10.5*  HCT 37.6* 33.6*  MCV 94.5 93.9  PLT 215 205   Recent Labs    01/01/19 0404 01/02/19 0342  LABPROT 14.9 16.3*  INR 1.2 1.3*      Assessment/Plan: -Choledocholithiasis.  Status post ERCP yesterday not able to remove  large square stones.  Plastic stent was placed. -Leukocytosis.  Most likely reactive.  He is afebrile.  LFTs improving. -Chronic anticoagulation.  Recommendations ------------------------ -Okay to discharge home from GI standpoint. -Okay to resume Coumadin  from Monday. -Recommend recheck CBC next week and considering antibiotics if  start spiking fever. -He will Need repeat ERCP in 4 to 6 weeks with spyglass and possible EHL - D/W Dr. Catha Gosselin -GI will sign off.  Call us back if needed  Kathi Der MD, FACP 01/02/2019, 10:10 AM  Contact #  [phone]

## 2019-01-02 NOTE — Discharge Summary (Addendum)
Physician Discharge Summary  Brandon [name] VCB:[id number] DOB: September 18, 1940 DOA: 12/28/2018  PCP: Gordan Payment., MD  Admit date: 12/28/2018 Discharge date: 01/02/2019  Time spent: 45 minutes  Recommendations for Outpatient Follow-up:  Patient will be discharged to home.  Patient will need to follow up with primary care provider within one week of discharge, repeat INR and CBC.  Restart coumadin on 01/04/2019. Follow up with [facility]. Follow up with general surgery. Patient should continue medications as prescribed.  Patient should follow a heart healthy/carb modified diet.   Discharge Diagnoses:  Principal Problem: Abdominal pain from symptomatic cholelithiasis, possible choledocholithiasis/normal LFTs and hyperbilirubinemia Leukocytosis Diarrhea/fecal incontinence associated with weight loss and nausea, vomiting History of pulmonary emboli Essential hypertension Hyperlipidemia  BPH Chronic diastolic CHF  Diabetes mellitus, type II  Discharge Condition: Stable  Diet recommendation: heart healthy/carb modified   Filed Weights   12/28/18 2224 01/01/19 0819  Weight: 99.8 kg 99.8 kg    History of present illness:  On 12/28/2018 by Dr. Francene Boyers McKlveenis a 78 y.o.malewith medical history significant ofdiastolic dysfunction CHF, bilateral pulmonary emboli status post Coumadin therapy ongoing, nonobstructive coronary artery disease status post cardiac cath in 2014, hypertension, diabetes, hyperlipidemia, prostate disease, GERD who was seen at [facility] ER with complaint of progressive weight loss over the past 2 months up to 10 pounds, stool incontinence occasionally. Patient was seen by his PCP and noted elevation in his liver function tests. Based on that he was sent to the ER for review. Initial evaluation in the ER including by ultrasound showed elevated liver function tests. Patient was evaluated using ultrasound that showed  choledocholithiasis with multiple gallstones in the gallbladder. He has positive Murphy sign but no evidence of acute cholecystitis. Patient sent over for GI evaluation. He is on chronic Coumadin with elevated INR so unable to do immediate invasive procedure. Patient still complains of some nausea and vomiting which he has had on and off. Mainly stimulated by meals. This has decreased his appetite which is why he lost the weight. He has no pain at rest.   Hospital Course:  Abdominal pain from symptomatic cholelithiasis, possible choledocholithiasis/normal LFTs and hyperbilirubinemia -Patient presented with right upper quadrant abdominal tenderness -GI consulted and appreciated, recommended MRCP (however patient has spinal cord stimulator) -Cardiology was consulted for preoperative assessment and found that patient was low risk and can move forward with surgery. -General surgery consulted and appreciated status post single site laparoscopic cholecystectomy with intraoperative cholangiogram, primary umbilical hernia repair -ERCP: Choledocholithiasis was found.  Partial removal was accomplished with biliary sphincterotomy; one plastic stent was placed in the common bile duct.  Her to avoid aspirin and NSAIDs indefinitely.  May resume Coumadin on 01/04/2019.  Will need repeat ERCP in 4 to 6 weeks with stent removal. -Follow up with general surgery -Patient was placed on ceftriaxone, will discharge with 3 additional days of Augmentin  Leukocytosis -WBC up to 21.6 however now trending downward -Suspect secondary to recent surgery; reactive -Patient currently afebrile -Repeat CBC  Diarrhea/fecal incontinence associated with weight loss and nausea, vomiting -GI following -Has been ongoing for several months  History of pulmonary emboli -Coumadin held, INR currently 1.3 -Discussed with GI- restart coumadin on Monday 01/04/2019 -Have INR checked with PCP  Essential hypertension -Hold ACEI  and lasix -BP stable  Hyperlipidemia  -continue fenofibrate  BPH -Continue finasteride and tamsulosin  Chronic diastolic CHF  -Currently euvolemic  -trace lower extremity edema -Cardiology consulted and appreciated for pre-operative assessment-  patient low risk, no further testing needed. Restart coumadin after surgery and follow up with PCP  Diabetes mellitus, type II -Holding glipizide- may resume on discharge -Was placed on insulin sliding scale and CBG monitoring   Consultants General surgery Gastroenterology Cardiology  Procedures  single site laparoscopic cholecystectomy with intraoperative cholangiogram, primary umbilical hernia repair  ERCP  Discharge Exam: Vitals:   01/01/19 2208 01/02/19 0554  BP: 130/61 132/73  Pulse: 70 62  Resp: 18 16  Temp: 98 F (36.7 C) 98.4 F (36.9 C)  SpO2: 94% 94%     General: Well developed, well nourished, NAD, appears stated age  HEENT: NCAT, mucous membranes moist. Infraorbital bruising L>R  Cardiovascular: S1 S2 auscultated, 2/6 SEM, RRR  Respiratory: Clear to auscultation bilaterally   Abdomen: Soft, nontender, nondistended, + bowel sounds  Extremities: warm dry without cyanosis clubbing or edema  Neuro: AAOx3, hard of hearing, otherwise nonfocal  Psych: Pleasant, appropriate mood and affect  Discharge Instructions Discharge Instructions    Discharge instructions   Complete by:  As directed    Patient will be discharged to home.  Patient will need to follow up with primary care provider within one week of discharge, repeat INR and CBC.  Restart coumadin on 01/04/2019. Follow up with [facility]. Follow up with general surgery. Patient should continue medications as prescribed.  Patient should follow a heart healthy/carb modified diet.     Allergies as of 01/02/2019      Reactions   Statins    Other reaction(s): Cramps (ALLERGY/intolerance)   Allopurinol Rash   Unknown   Doxycycline Rash        Medication List    TAKE these medications   amoxicillin-clavulanate 875-125 MG tablet Commonly known as:  Augmentin Take 1 tablet by mouth 2 (two) times daily for 3 days.   Colchicine 0.6 MG Caps Take 0.6 mg by mouth daily.   DULoxetine 30 MG capsule Commonly known as:  CYMBALTA Take 30 mg by mouth daily.   febuxostat 40 MG tablet Commonly known as:  ULORIC Take 40 mg by mouth daily.   feeding supplement (ENSURE ENLIVE) Liqd Take 237 mLs by mouth daily.   fenofibrate 160 MG tablet Take 160 mg by mouth daily.   finasteride 5 MG tablet Commonly known as:  PROSCAR Take 5 mg by mouth daily.   fosinopril 40 MG tablet Commonly known as:  MONOPRIL Take 40 mg by mouth daily.   furosemide 20 MG tablet Commonly known as:  LASIX Take 20 mg by mouth daily.   glipiZIDE 5 MG 24 hr tablet Commonly known as:  GLUCOTROL XL Take 5 mg by mouth daily.   multivitamin with minerals Tabs tablet Take 1 tablet by mouth daily.   omeprazole 10 MG capsule Commonly known as:  PRILOSEC Take 10 mg by mouth daily.   oxyCODONE-acetaminophen 10-325 MG tablet Commonly known as:  PERCOCET Take 1 tablet by mouth 2 (two) times a day.   tamsulosin 0.4 MG Caps capsule Commonly known as:  FLOMAX Take 0.4 mg by mouth daily.   warfarin 5 MG tablet Commonly known as:  COUMADIN Take 1 tablet (5 mg total) by mouth daily. Start taking on:  Jan 04, 2019 What changed:  These instructions start on Jan 04, 2019. If you are unsure what to do until then, ask your doctor or other care provider.      Allergies  Allergen Reactions   Statins     Other reaction(s): Cramps (ALLERGY/intolerance)   Allopurinol  Rash    Unknown   Doxycycline Rash   Follow-up Information    [facility] Follow up on 01/12/2019.   Specialty:  General Surgery Why:  Your follow up will be a phone call appointment  (due to the [facility] virus, to limit exposure.) Hedda Slade will call you at 10:00AM.  Please email a  picture if you have an concerns with you wound to : [email or web address] Contact information: [address] [place] [address] [phone]        Gordan Payment., MD Follow up.   Specialty:  Internal Medicine Why:  Call and let him know you had surgery and follow up for Medical issues.   Contact information: [address] [place] [address] [phone]        Willis Modena, MD. Schedule an appointment as soon as possible for a visit in 3 week(s).   Specialty:  Gastroenterology Why:  Hospital follow up Contact information: [address] [place] [address] [phone]            The results of significant diagnostics from this hospitalization (including imaging, microbiology, ancillary and laboratory) are listed below for reference.    Significant Diagnostic Studies: Dg Cholangiogram Operative  Result Date: 12/31/2018 CLINICAL DATA:  78 year old male with cholelithiasis EXAM: INTRAOPERATIVE CHOLANGIOGRAM TECHNIQUE: Cholangiographic images from the C-arm fluoroscopic device were submitted for interpretation post-operatively. Please see the procedural report for the amount of contrast and the fluoroscopy time utilized. COMPARISON:  12/28/2018 FINDINGS: Surgical instruments project over the upper abdomen. There is cannulation of the cystic duct/gallbladder neck, with antegrade infusion of contrast. Caliber of the extrahepatic ductal system within normal limits. Ill-defined filling defects within the common bile duct with a rounded filling defect in the distal common bile duct. No contrast traverses the ampulla. IMPRESSION: Intraoperative cholangiogram demonstrates evidence of choledocholithiasis, and no contrast traversing the ampulla. Please refer to the dictated operative report for full details of intraoperative findings and procedure Electronically Signed   By: Gilmer Mor D.O.   On: 12/31/2018 16:56   Dg Ercp Biliary &  Pancreatic Ducts  Result Date: 01/01/2019 CLINICAL DATA:  Evidence of choledocholithiasis by intraoperative cholangiogram. EXAM: ERCP TECHNIQUE: Multiple spot images obtained with the fluoroscopic device and submitted for interpretation post-procedure. COMPARISON:  Intraoperative cholangiogram on 12/31/2018 FINDINGS: Intraoperative imaging obtained with a C-arm shows endoscopic cannulation of the common bile duct with contrast injection demonstrating multiple filling defects in the common bile duct. Balloon sweep maneuvers were performed. Final image also shows placement of a biliary stent in the common bile duct. IMPRESSION: Balloon sweep maneuvers performed to remove calculi from the common bile duct. An endoscopic biliary stent was also placed in the common bile duct. These images were submitted for radiologic interpretation only. Please see the procedural report for the amount of contrast and the fluoroscopy time utilized. Electronically Signed   By: Irish Lack M.D.   On: 01/01/2019 10:59    Microbiology: Recent Results (from the past 240 hour(s))  Surgical PCR screen     Status: None   Collection Time: 12/29/18 12:01 AM  Result Value Ref Range Status   MRSA, PCR NEGATIVE NEGATIVE Final   Staphylococcus aureus NEGATIVE NEGATIVE Final    Comment: (NOTE) The Xpert SA Assay (FDA approved for NASAL specimens in patients 50 years of age and older), is one component of a comprehensive surveillance program. It is not intended to diagnose infection nor to guide or  monitor treatment. Performed at [facility], [address], [place], [place] [address]      Labs: Basic Metabolic Panel: Recent Labs  Lab 12/29/18 0409 12/30/18 0409 12/31/18 0427 01/01/19 0404 01/02/19 0342  NA 139 138 138 134* 135  K 3.7 3.7 3.7 4.3 4.5  CL 108 109 109 106 105  CO2 20* 24  GLUCOSE 81 90 106* 212* 125*  BUN 29*  CREATININE 1.00 1.03 1.04 1.13 1.13  CALCIUM 9.0  8.8* 9.0 9.0 8.6*  MG 2.1 2.1  --   --   --   PHOS 3.0 3.2  --   --   --    Liver Function Tests: Recent Labs  Lab 12/29/18 0409 12/30/18 0409 12/31/18 0427 01/01/19 0404 01/02/19 0342  AST 167* 72* 41 39 24  ALT 116* 83* 61* 51* 37  ALKPHOS 261* 234* 211* 178* 145*  BILITOT 2.8* 1.3* 1.2 1.2 1.1  PROT 6.3* 6.4* 6.4* 6.3* 6.0*  ALBUMIN 3.1* 3.0* 3.1* 2.9* 2.8*   Recent Labs  Lab 12/30/18 0409 01/01/19 0404  LIPASE 25 23   No results for input(s): AMMONIA in the last 168 hours. CBC: Recent Labs  Lab 12/29/18 0409 12/30/18 0409 01/01/19 0404 01/02/19 0342  WBC 8.5 7.5 21.6* 18.3*  NEUTROABS  --  3.8  --   --   HGB 11.7* 12.1* 11.7* 10.5*  HCT 38.5* 39.7 37.6* 33.6*  MCV 93.2 93.6 94.5 93.9  PLT 226 225 215 205   Cardiac Enzymes: No results for input(s): CKTOTAL, CKMB, CKMBINDEX, TROPONINI in the last 168 hours. BNP: BNP (last 3 results) No results for input(s): BNP in the last 8760 hours.  ProBNP (last 3 results) No results for input(s): PROBNP in the last 8760 hours.  CBG: Recent Labs  Lab 01/01/19 0818 01/01/19 1137 01/01/19 1702 01/01/19 2210 01/02/19 0748  GLUCAP 126* 170* 127* 185* 110*       Signed:  Kaylani Fromme  [facility] 01/02/2019, 10:33 AM

## 2019-01-03 NOTE — Anesthesia Postprocedure Evaluation (Signed)
Anesthesia Post Note  Patient: Brandon [name]  Procedure(s) Performed: ENDOSCOPIC RETROGRADE CHOLANGIOPANCREATOGRAPHY (ERCP) (N/A ) SPHINCTEROTOMY BILIARY STENT PLACEMENT (N/A ) REMOVAL OF STONES     Patient location during evaluation: Endoscopy Anesthesia Type: General Level of consciousness: awake and alert Pain management: pain level controlled Vital Signs Assessment: post-procedure vital signs reviewed and stable Respiratory status: spontaneous breathing, nonlabored ventilation, respiratory function stable and patient connected to nasal cannula oxygen Cardiovascular status: blood pressure returned to baseline and stable Postop Assessment: no apparent nausea or vomiting Anesthetic complications: no    Last Vitals:  Vitals:   01/01/19 2208 01/02/19 0554  BP: 130/61 132/73  Pulse: 70 62  Resp: 18 16  Temp: 36.7 C 36.9 C  SpO2: 94% 94%    Last Pain:  Vitals:   01/02/19 0800  TempSrc:   PainSc: 0-No pain                 Vanilla Heatherington COKER

## 2019-01-04 ENCOUNTER — Encounter (HOSPITAL_COMMUNITY): Payer: Self-pay | Admitting: Gastroenterology

## 2019-01-15 ENCOUNTER — Other Ambulatory Visit: Payer: Self-pay | Admitting: Gastroenterology

## 2019-01-19 ENCOUNTER — Other Ambulatory Visit (HOSPITAL_COMMUNITY)
Admission: RE | Admit: 2019-01-19 | Discharge: 2019-01-19 | Disposition: A | Discharge status: Home / Self Care | Payer: Medicare HMO | Source: Ambulatory Visit | Attending: Gastroenterology | Admitting: Gastroenterology

## 2019-01-19 DIAGNOSIS — Z1159 Encounter for screening for other viral diseases: Secondary | ICD-10-CM | POA: Insufficient documentation

## 2019-01-20 LAB — NOVEL CORONAVIRUS, NAA (HOSP ORDER, SEND-OUT TO REF LAB; TAT 18-24 HRS): SARS-CoV-2, NAA: NOT DETECTED

## 2019-01-21 ENCOUNTER — Encounter (HOSPITAL_COMMUNITY): Payer: Self-pay

## 2019-01-21 NOTE — Anesthesia Preprocedure Evaluation (Addendum)
Anesthesia Evaluation  Patient identified by MRN, date of birth, ID band Patient awake    Reviewed: Allergy & Precautions, NPO status , Patient's Chart, lab work & pertinent test results  Airway Mallampati: II  TM Distance: >3 FB Neck ROM: Full    Dental no notable dental hx. (+) Teeth Intact   Pulmonary sleep apnea , former smoker,    Pulmonary exam normal breath sounds clear to auscultation       Cardiovascular hypertension, Pt. on medications Normal cardiovascular exam Rhythm:Regular Rate:Normal     Neuro/Psych Depression  Neuromuscular disease    GI/Hepatic hiatal hernia,   Endo/Other  diabetes, Type 2  Renal/GU Renal disease     Musculoskeletal   Abdominal   Peds  Hematology   Anesthesia Other Findings   Reproductive/Obstetrics                            Anesthesia Physical Anesthesia Plan  ASA: III  Anesthesia Plan: General   Post-op Pain Management:    Induction: Intravenous, Cricoid pressure planned and Rapid sequence  PONV Risk Score and Plan: 2 and Treatment may vary due to age or medical condition, Dexamethasone and Ondansetron  Airway Management Planned: Oral ETT  Additional Equipment:   Intra-op Plan:   Post-operative Plan: Extubation in OR  Informed Consent: I have reviewed the patients History and Physical, chart, labs and discussed the procedure including the risks, benefits and alternatives for the proposed anesthesia with the patient or authorized representative who has indicated his/her understanding and acceptance.     Dental advisory given  Plan Discussed with: CRNA  Anesthesia Plan Comments:       Anesthesia Quick Evaluation

## 2019-01-21 NOTE — Progress Notes (Signed)
Spoke with pt's wife who states that the pt is not experiencing and signs or symptoms of the COVID virus and has not been around anyone who has been exposed or could have been exposed since the negative COVID test. Weston Settle, RN

## 2019-01-22 ENCOUNTER — Encounter (HOSPITAL_COMMUNITY): Payer: Self-pay

## 2019-01-22 ENCOUNTER — Other Ambulatory Visit: Payer: Self-pay

## 2019-01-22 ENCOUNTER — Ambulatory Visit (HOSPITAL_COMMUNITY): Payer: Medicare HMO | Admitting: Anesthesiology

## 2019-01-22 ENCOUNTER — Ambulatory Visit (HOSPITAL_COMMUNITY): Payer: Medicare HMO

## 2019-01-22 ENCOUNTER — Encounter (HOSPITAL_COMMUNITY)
Admission: RE | Disposition: A | Discharge status: Home / Self Care | Payer: Self-pay | Source: Home / Self Care | Attending: Gastroenterology

## 2019-01-22 ENCOUNTER — Ambulatory Visit (HOSPITAL_COMMUNITY)
Admission: RE | Admit: 2019-01-22 | Discharge: 2019-01-22 | Disposition: A | Discharge status: Home / Self Care | Payer: Medicare HMO | Attending: Gastroenterology | Admitting: Gastroenterology

## 2019-01-22 DIAGNOSIS — J449 Chronic obstructive pulmonary disease, unspecified: Secondary | ICD-10-CM | POA: Insufficient documentation

## 2019-01-22 DIAGNOSIS — E538 Deficiency of other specified B group vitamins: Secondary | ICD-10-CM | POA: Diagnosis not present

## 2019-01-22 DIAGNOSIS — F329 Major depressive disorder, single episode, unspecified: Secondary | ICD-10-CM | POA: Diagnosis not present

## 2019-01-22 DIAGNOSIS — I5032 Chronic diastolic (congestive) heart failure: Secondary | ICD-10-CM | POA: Insufficient documentation

## 2019-01-22 DIAGNOSIS — Z79899 Other long term (current) drug therapy: Secondary | ICD-10-CM | POA: Insufficient documentation

## 2019-01-22 DIAGNOSIS — E669 Obesity, unspecified: Secondary | ICD-10-CM | POA: Insufficient documentation

## 2019-01-22 DIAGNOSIS — I130 Hypertensive heart and chronic kidney disease with heart failure and stage 1 through stage 4 chronic kidney disease, or unspecified chronic kidney disease: Secondary | ICD-10-CM | POA: Diagnosis not present

## 2019-01-22 DIAGNOSIS — Z6830 Body mass index (BMI) 30.0-30.9, adult: Secondary | ICD-10-CM | POA: Diagnosis not present

## 2019-01-22 DIAGNOSIS — Z7901 Long term (current) use of anticoagulants: Secondary | ICD-10-CM | POA: Diagnosis not present

## 2019-01-22 DIAGNOSIS — M109 Gout, unspecified: Secondary | ICD-10-CM | POA: Diagnosis not present

## 2019-01-22 DIAGNOSIS — E1122 Type 2 diabetes mellitus with diabetic chronic kidney disease: Secondary | ICD-10-CM | POA: Insufficient documentation

## 2019-01-22 DIAGNOSIS — Z7984 Long term (current) use of oral hypoglycemic drugs: Secondary | ICD-10-CM | POA: Diagnosis not present

## 2019-01-22 DIAGNOSIS — K8050 Calculus of bile duct without cholangitis or cholecystitis without obstruction: Secondary | ICD-10-CM | POA: Diagnosis not present

## 2019-01-22 DIAGNOSIS — N183 Chronic kidney disease, stage 3 (moderate): Secondary | ICD-10-CM | POA: Insufficient documentation

## 2019-01-22 DIAGNOSIS — N401 Enlarged prostate with lower urinary tract symptoms: Secondary | ICD-10-CM | POA: Insufficient documentation

## 2019-01-22 DIAGNOSIS — Z87891 Personal history of nicotine dependence: Secondary | ICD-10-CM | POA: Diagnosis not present

## 2019-01-22 DIAGNOSIS — K219 Gastro-esophageal reflux disease without esophagitis: Secondary | ICD-10-CM | POA: Insufficient documentation

## 2019-01-22 DIAGNOSIS — Z86711 Personal history of pulmonary embolism: Secondary | ICD-10-CM | POA: Insufficient documentation

## 2019-01-22 DIAGNOSIS — G4730 Sleep apnea, unspecified: Secondary | ICD-10-CM | POA: Insufficient documentation

## 2019-01-22 HISTORY — DX: Type 2 diabetes mellitus with diabetic polyneuropathy: E11.42

## 2019-01-22 HISTORY — DX: Other specified abnormal findings of blood chemistry: R79.89

## 2019-01-22 HISTORY — DX: Male erectile dysfunction, unspecified: N52.9

## 2019-01-22 HISTORY — DX: Unspecified osteoarthritis, unspecified site: M19.90

## 2019-01-22 HISTORY — DX: Benign prostatic hyperplasia with lower urinary tract symptoms: R39.15

## 2019-01-22 HISTORY — PX: REMOVAL OF STONES: SHX5545

## 2019-01-22 HISTORY — DX: Chronic kidney disease, stage 3 unspecified: N18.30

## 2019-01-22 HISTORY — PX: BILIARY STENT PLACEMENT: SHX5538

## 2019-01-22 HISTORY — PX: ERCP: SHX5425

## 2019-01-22 HISTORY — DX: Deficiency of other specified B group vitamins: E53.8

## 2019-01-22 HISTORY — DX: Gout, unspecified: M10.9

## 2019-01-22 HISTORY — PX: SPYGLASS LITHOTRIPSY: SHX5537

## 2019-01-22 HISTORY — DX: Benign prostatic hyperplasia with lower urinary tract symptoms: N40.1

## 2019-01-22 HISTORY — DX: Edema, unspecified: R60.9

## 2019-01-22 HISTORY — DX: Other ill-defined heart diseases: I51.89

## 2019-01-22 HISTORY — DX: Other nonspecific abnormal finding of lung field: R91.8

## 2019-01-22 HISTORY — DX: Disorders of diaphragm: J98.6

## 2019-01-22 HISTORY — DX: Other malaise: R53.81

## 2019-01-22 HISTORY — DX: Unspecified abnormalities of gait and mobility: R26.9

## 2019-01-22 HISTORY — DX: Cervical disc disorder, unspecified, unspecified cervical region: M50.90

## 2019-01-22 HISTORY — DX: Major depressive disorder, single episode, unspecified: F32.9

## 2019-01-22 HISTORY — DX: Pneumonia, unspecified organism: J18.9

## 2019-01-22 HISTORY — DX: Endocrine disorder, unspecified: E34.9

## 2019-01-22 HISTORY — PX: STENT REMOVAL: SHX6421

## 2019-01-22 HISTORY — PX: SPHINCTEROTOMY: SHX5544

## 2019-01-22 HISTORY — PX: BILIARY DILATION: SHX6850

## 2019-01-22 HISTORY — PX: SPYGLASS CHOLANGIOSCOPY: SHX5441

## 2019-01-22 LAB — GLUCOSE, CAPILLARY: Glucose-Capillary: 81 mg/dL (ref 70–99)

## 2019-01-22 SURGERY — ERCP, WITH INTERVENTION IF INDICATED
Anesthesia: General

## 2019-01-22 MED ORDER — INDOMETHACIN 50 MG RE SUPP
RECTAL | Status: AC
  Filled 2019-01-22: qty 2

## 2019-01-22 MED ORDER — PROPOFOL 10 MG/ML IV BOLUS
INTRAVENOUS | Status: AC
  Filled 2019-01-22: qty 20

## 2019-01-22 MED ORDER — ONDANSETRON HCL 4 MG/2ML IJ SOLN
INTRAMUSCULAR | Status: DC | PRN
  Administered 2019-01-22: 4 mg via INTRAVENOUS

## 2019-01-22 MED ORDER — PROPOFOL 10 MG/ML IV BOLUS
INTRAVENOUS | Status: DC | PRN
  Administered 2019-01-22: 140 mg via INTRAVENOUS

## 2019-01-22 MED ORDER — SUCCINYLCHOLINE CHLORIDE 20 MG/ML IJ SOLN
INTRAMUSCULAR | Status: DC | PRN
  Administered 2019-01-22: 80 mg via INTRAVENOUS

## 2019-01-22 MED ORDER — LACTATED RINGERS IV SOLN
INTRAVENOUS | Status: DC
  Administered 2019-01-22: 07:00:00 via INTRAVENOUS

## 2019-01-22 MED ORDER — FENTANYL CITRATE (PF) 100 MCG/2ML IJ SOLN
INTRAMUSCULAR | Status: AC
  Filled 2019-01-22: qty 2

## 2019-01-22 MED ORDER — GLUCAGON HCL RDNA (DIAGNOSTIC) 1 MG IJ SOLR
INTRAMUSCULAR | Status: AC
  Filled 2019-01-22: qty 1

## 2019-01-22 MED ORDER — DEXAMETHASONE SODIUM PHOSPHATE 10 MG/ML IJ SOLN
INTRAMUSCULAR | Status: DC | PRN
  Administered 2019-01-22: 5 mg via INTRAVENOUS

## 2019-01-22 MED ORDER — LIDOCAINE HCL (CARDIAC) PF 100 MG/5ML IV SOSY
PREFILLED_SYRINGE | INTRAVENOUS | Status: DC | PRN
  Administered 2019-01-22: 100 mg via INTRAVENOUS

## 2019-01-22 MED ORDER — FENTANYL CITRATE (PF) 100 MCG/2ML IJ SOLN
INTRAMUSCULAR | Status: DC | PRN
  Administered 2019-01-22: 25 ug via INTRAVENOUS
  Administered 2019-01-22 (×2): 50 ug via INTRAVENOUS

## 2019-01-22 MED ORDER — PHENYLEPHRINE 40 MCG/ML (10ML) SYRINGE FOR IV PUSH (FOR BLOOD PRESSURE SUPPORT)
PREFILLED_SYRINGE | INTRAVENOUS | Status: DC | PRN
  Administered 2019-01-22: 80 ug via INTRAVENOUS
  Administered 2019-01-22 (×2): 40 ug via INTRAVENOUS
  Administered 2019-01-22: 80 ug via INTRAVENOUS
  Administered 2019-01-22 (×3): 40 ug via INTRAVENOUS

## 2019-01-22 MED ORDER — SODIUM CHLORIDE 0.9 % IV SOLN: INTRAVENOUS | Status: DC

## 2019-01-22 MED ORDER — CIPROFLOXACIN IN D5W 400 MG/200ML IV SOLN
INTRAVENOUS | Status: AC
  Filled 2019-01-22: qty 200

## 2019-01-22 MED ORDER — CIPROFLOXACIN IN D5W 400 MG/200ML IV SOLN
INTRAVENOUS | Status: DC | PRN
  Administered 2019-01-22: 400 mg via INTRAVENOUS

## 2019-01-22 MED ORDER — SODIUM CHLORIDE 0.9 % IV SOLN
INTRAVENOUS | Status: DC | PRN
  Administered 2019-01-22: 75 mL

## 2019-01-22 NOTE — Anesthesia Postprocedure Evaluation (Signed)
Anesthesia Post Note  Patient: Brandon [name]  Procedure(s) Performed: ENDOSCOPIC RETROGRADE CHOLANGIOPANCREATOGRAPHY (ERCP) (N/A ) SPYGLASS LITHOTRIPSY (N/A ) REMOVAL OF STONES BILIARY DILATION SPHINCTEROTOMY BILIARY STENT PLACEMENT (N/A ) STENT REMOVAL     Patient location during evaluation: Endoscopy Anesthesia Type: General Level of consciousness: awake and alert Pain management: pain level controlled Vital Signs Assessment: post-procedure vital signs reviewed and stable Respiratory status: spontaneous breathing, nonlabored ventilation, respiratory function stable and patient connected to nasal cannula oxygen Cardiovascular status: blood pressure returned to baseline and stable Postop Assessment: no apparent nausea or vomiting Anesthetic complications: no    Last Vitals:  Vitals:   01/22/19 1050 01/22/19 1100  BP: (!) 118/52 129/65  Pulse: 73   Resp: 16 14  Temp:    SpO2: 93% 93%    Last Pain:  Vitals:   01/22/19 1040  TempSrc:   PainSc: 0-No pain                 Trevor Iha

## 2019-01-22 NOTE — Transfer of Care (Signed)
Immediate Anesthesia Transfer of Care Note  Patient: Valetta Close  Procedure(s) Performed: ENDOSCOPIC RETROGRADE CHOLANGIOPANCREATOGRAPHY (ERCP) (N/A ) SPYGLASS LITHOTRIPSY (N/A ) REMOVAL OF STONES BILIARY DILATION SPHINCTEROTOMY BILIARY STENT PLACEMENT (N/A )  Patient Location: PACU  Anesthesia Type:General  Level of Consciousness: drowsy, patient cooperative and responds to stimulation  Airway & Oxygen Therapy: Patient Spontanous Breathing and Patient connected to face mask oxygen  Post-op Assessment: Report given to RN and Post -op Vital signs reviewed and stable  Post vital signs: Reviewed and stable  Last Vitals:  Vitals Value Taken Time  BP    Temp    Pulse    Resp    SpO2      Last Pain:  Vitals:   01/22/19 0657  TempSrc: Oral  PainSc:          Complications: No apparent anesthesia complications

## 2019-01-22 NOTE — Discharge Instructions (Signed)
YOU HAD AN ENDOSCOPIC PROCEDURE TODAY: Refer to the procedure report and other information in the discharge instructions given to you for any specific questions about what was found during the examination. If this information does not answer your questions, please call [facility] office at [phone] to clarify.   YOU SHOULD EXPECT: Some feelings of bloating in the abdomen. Passage of more gas than usual. Walking can help get rid of the air that was put into your GI tract during the procedure and reduce the bloating. If you had a lower endoscopy (such as a colonoscopy or flexible sigmoidoscopy) you may notice spotting of blood in your stool or on the toilet paper. Some abdominal soreness may be present for a day or two, also.  DIET: Your first meal following the procedure should be a light meal and then it is ok to progress to your normal diet. A half-sandwich or bowl of soup is an example of a good first meal. Heavy or fried foods are harder to digest and may make you feel nauseous or bloated. Drink plenty of fluids but you should avoid alcoholic beverages for 24 hours. If you had a esophageal dilation, please see attached instructions for diet.   ACTIVITY: Your care partner should take you home directly after the procedure. You should plan to take it easy, moving slowly for the rest of the day. You can resume normal activity the day after the procedure however YOU SHOULD NOT DRIVE, use power tools, machinery or perform tasks that involve climbing or major physical exertion for 24 hours (because of the sedation medicines used during the test).   SYMPTOMS TO REPORT IMMEDIATELY: A gastroenterologist can be reached at any hour. Please call [phone]  for any of the following symptoms:   Following upper endoscopy (EGD, EUS, ERCP, esophageal dilation) Vomiting of blood or coffee ground material  New, significant abdominal pain  New, significant chest pain or pain under the shoulder blades  Painful or  persistently difficult swallowing  New shortness of breath  Black, tarry-looking or red, bloody stools  FOLLOW UP:  If any biopsies were taken you will be contacted by phone or by letter within the next 1-3 weeks. Call [phone]  if you have not heard about the biopsies in 3 weeks.  Please also call with any specific questions about appointments or follow up tests.  Call if question or problem otherwise clear liquid diet until 4 PM today and if okay may have soft solids this evening and if doing well set up follow-up in 2 weeks to discuss further work-up and plans and if doing well on Sunday may resume Coumadin at usual dose

## 2019-01-22 NOTE — Op Note (Signed)
[facility] Patient Name: Brandon [name] Procedure Date: 01/22/2019 MRN: [id number] Attending MD: Vida Rigger , MD Date of Birth: July 26, 1941 CSN: [id number] Age: 78 Admit Type: Outpatient Procedure:                ERCP Indications:              Bile duct stone(s), For therapy of bile duct                            stone(s) Providers:                Vida Rigger, MD, Dwain Sarna, RN, Clearnce Sorrel,                            RN, Beryle Beams, Technician, Roni Bread,                            CRNA Referring MD:              Medicines:                General Anesthesia Complications:            No immediate complications. Estimated Blood Loss:     Estimated blood loss: none. Procedure:                Pre-Anesthesia Assessment:                           - Prior to the procedure, a History and Physical                            was performed, and patient medications and                            allergies were reviewed. The patient's tolerance of                            previous anesthesia was also reviewed. The risks                            and benefits of the procedure and the sedation                            options and risks were discussed with the patient.                            All questions were answered, and informed consent                            was obtained. Prior Anticoagulants: The patient has                            taken Coumadin (warfarin), last dose was 5 days                            prior to procedure. ASA Grade  Assessment: II - A                            patient with mild systemic disease. After reviewing                            the risks and benefits, the patient was deemed in                            satisfactory condition to undergo the procedure.                           After obtaining informed consent, the scope was                            passed under direct vision. Throughout the         procedure, the patient's blood pressure, pulse, and                            oxygen saturations were monitored continuously. The                            TJF-Q180V ([id number] Olympus duodenoscope was                            introduced through the mouth, and used to inject                            contrast into and used to cannulate the bile duct.                            The ERCP was technically difficult and complex due                            to abnormal anatomy we did fall back in the stomach                            frequently while trying to use spyglass. Successful                            completion of the procedure was aided by performing                            the maneuvers documented (below) in this report.                            The patient tolerated the procedure well. Scope In: Scope Out: Findings:      One plastic stent originating in the biliary tree was emerging from the       major papilla. The stent was visibly patent. One stent was removed from       the biliary tree using a snare. Deep selective cannulation was readily       obtained and there was no  pancreatic duct injection or wire advancement       throughout the procedure and the biliary sphincterotomy was extended       with a Hydratome sphincterotome using ERBE electrocautery. There was no       post-sphincterotomy bleeding. Choledocholithiasis was found in a       nondilated duct on initial cholangiogram. The common bile duct contained       few stones. The biliary tree was swept with an adjustable 12- 15 mm       balloon starting at the bifurcation. Sludge was swept from the duct. A       few stones were removed. A few stones remained. The 15 would not pass       through the sphincterotomy site and the 12 would pass some of the times       with moderate pressure or by lowering it even smaller and on occlusion       cholangiogram there seemed to be a distal stone very sluggish  drainage       so we initially increased the sphincterotomy site but still could not       withdraw the balloons very well so we proceeded with dilation of the       distal duct common bile duct with a 4 cm by 10 mm balloon dilator was       successful. We again retried the stone balloons and used the inject       below to get better pictures and the stone seemed to be possibly       embedded in the wall of the duct so we proceeded with spyglass and the       bile duct was explored endoscopically using the SpyGlass direct       visualization system. The SpyScope was advanced to the lower third of       the main duct. Visibility with the scope was fair. The lower third of       the main bile duct contained few possibly embedded stones.       Electrohydraulic lithotripsy was used and partially successful but we       had lots of difficulty maintaining position and visualization was not       optimal and after a prolonged effort and falling back in the stomach       multiple times we elected to finish the procedure and The biliary tree       was swept with a 12 mm balloon starting at the bifurcation. Sludge was       swept from the duct. But again there was sluggish biliary drainage and       the larger stones were not removed and we elected to place one 10 Fr by       5 cm plastic stent with a single external flap and a single internal       flap was placed 4.5 cm into the common bile duct. Bile flowed through       the stent. The stent was in good position. Impression:               - One visibly patent stent from the biliary tree                            was seen in the major papilla removed with the  snare.                           - Choledocholithiasis was found. Partial removal                            was accomplished with biliary sphincterotomy x2; a                            stent was inserted at the end of the procedure.                           -The  original stent was removed from the biliary                            tree.                           - A biliary sphincterotomy was performedx2.                           - The biliary tree was swept.                           -Ampulla and distal Common bile duct was                            successfully dilated.                           - Lithotripsy was partially successful using                            spyglass as above.                           - The biliary tree was swept and sludge was found.                           - One plastic stent was placed into the common bile                            duct. Moderate Sedation:      Not Applicable - Patient had care per Anesthesia. Recommendation:           - Clear liquid diet for 6 hours.                           - Continue present medications.                           - Resume Coumadin (warfarin) at prior dose in 2                            days.                           -  Return to GI clinic in 2 weeks.                           - Telephone GI clinic if symptomatic PRN. Procedure Code(s):        --- Professional ---                           [phone], Endoscopic retrograde                            cholangiopancreatography (ERCP); with removal and                            exchange of stent(s), biliary or pancreatic duct,                            including pre- and post-dilation and guide wire                            passage, when performed, including sphincterotomy,                            when performed, each stent exchanged                           43265, Endoscopic retrograde                            cholangiopancreatography (ERCP); with destruction                            of calculi, any method (eg, mechanical,                            electrohydraulic, lithotripsy)                           43273, Endoscopic cannulation of papilla with                            direct visualization of  pancreatic/common bile                            duct(s) (List separately in addition to code(s) for                            primary procedure) Diagnosis Code(s):        --- Professional ---                           U04.54, Presence of other specified functional                            implants                           K80.50, Calculus of bile duct without  cholangitis                            or cholecystitis without obstruction                           Z46.59, Encounter for fitting and adjustment of                            other gastrointestinal appliance and device CPT copyright 2019 [employer]. All rights reserved. The codes documented in this report are preliminary and upon coder review may  be revised to meet current compliance requirements. Vida RiggerMarc Taro Hidrogo, MD 01/22/2019 10:49:23 AM This report has been signed electronically. Number of Addenda: 0

## 2019-01-22 NOTE — Anesthesia Procedure Notes (Signed)
Procedure Name: Intubation Date/Time: 01/22/2019 7:46 AM Performed by: Thornell Mule, CRNA Pre-anesthesia Checklist: Patient identified, Emergency Drugs available, Suction available and Patient being monitored Patient Re-evaluated:Patient Re-evaluated prior to induction Oxygen Delivery Method: Circle system utilized Preoxygenation: Pre-oxygenation with 100% oxygen Induction Type: IV induction Ventilation: Mask ventilation without difficulty Laryngoscope Size: Miller and 3 Grade View: Grade I Tube type: Oral Tube size: 7.5 mm Number of attempts: 1 Airway Equipment and Method: Stylet and Oral airway Placement Confirmation: ETT inserted through vocal cords under direct vision,  positive ETCO2 and breath sounds checked- equal and bilateral Secured at: 23 cm Tube secured with: Tape Dental Injury: Teeth and Oropharynx as per pre-operative assessment

## 2019-01-22 NOTE — Progress Notes (Signed)
Brandon [name] 7:39 AM  Subjective: Patient seen and examined in his hospital computer chart reviewed and other than persistent right-sided pain since his surgery he is has no other complaints  Objective: Vital signs stable afebrile exam please see preassessment evaluation recent labs previous ERCP reviewed  Assessment: Residual CBD stones  Plan: Okay to retry ERCP with anesthesia assistance and hopefully stone extraction and the risks benefits and success rate was rediscussed with the patient  [facility] E  office [phone] After 5PM or if no answer call [phone]

## 2019-01-26 ENCOUNTER — Encounter (HOSPITAL_COMMUNITY): Payer: Self-pay | Admitting: Gastroenterology

## 2019-02-05 ENCOUNTER — Other Ambulatory Visit: Payer: Self-pay | Admitting: Gastroenterology

## 2019-02-15 ENCOUNTER — Other Ambulatory Visit (HOSPITAL_COMMUNITY)
Admission: RE | Admit: 2019-02-15 | Discharge: 2019-02-15 | Disposition: A | Discharge status: Home / Self Care | Payer: Medicare HMO | Source: Ambulatory Visit | Attending: Gastroenterology | Admitting: Gastroenterology

## 2019-02-15 DIAGNOSIS — Z1159 Encounter for screening for other viral diseases: Secondary | ICD-10-CM | POA: Insufficient documentation

## 2019-02-16 LAB — NOVEL CORONAVIRUS, NAA (HOSP ORDER, SEND-OUT TO REF LAB; TAT 18-24 HRS): SARS-CoV-2, NAA: NOT DETECTED

## 2019-02-17 ENCOUNTER — Encounter (HOSPITAL_COMMUNITY): Payer: Self-pay

## 2019-02-17 ENCOUNTER — Other Ambulatory Visit: Payer: Self-pay

## 2019-02-17 NOTE — Progress Notes (Signed)
SPOKE W/  Zubayr     SCREENING SYMPTOMS OF COVID 19:   COUGH--NO  RUNNY NOSE--- NO  SORE THROAT--NO-  NASAL CONGESTION----NO  SNEEZING----NO  SHORTNESS OF BREATH---NO  DIFFICULTY BREATHING---NO  TEMP >100.0 -----NO  UNEXPLAINED BODY ACHES------NO  CHILLS -------- NO  HEADACHES ---------NO  LOSS OF SMELL/ TASTE --------NO    HAVE YOU OR ANY FAMILY MEMBER TRAVELLED PAST 14 DAYS OUT OF THE   COUNTY---NO STATE----NO COUNTRY----NO  HAVE YOU OR ANY FAMILY MEMBER BEEN EXPOSED TO ANYONE WITH COVID 19? NO

## 2019-02-18 ENCOUNTER — Encounter (HOSPITAL_COMMUNITY): Payer: Self-pay | Admitting: Emergency Medicine

## 2019-02-18 ENCOUNTER — Encounter (HOSPITAL_COMMUNITY)
Admission: RE | Disposition: A | Discharge status: Home / Self Care | Payer: Self-pay | Source: Home / Self Care | Attending: Gastroenterology

## 2019-02-18 ENCOUNTER — Ambulatory Visit (HOSPITAL_COMMUNITY): Payer: Medicare HMO

## 2019-02-18 ENCOUNTER — Ambulatory Visit (HOSPITAL_COMMUNITY): Payer: Medicare HMO | Admitting: Physician Assistant

## 2019-02-18 ENCOUNTER — Ambulatory Visit (HOSPITAL_COMMUNITY)
Admission: RE | Admit: 2019-02-18 | Discharge: 2019-02-18 | Disposition: A | Discharge status: Home / Self Care | Payer: Medicare HMO | Attending: Gastroenterology | Admitting: Gastroenterology

## 2019-02-18 ENCOUNTER — Other Ambulatory Visit: Payer: Self-pay

## 2019-02-18 DIAGNOSIS — Z7984 Long term (current) use of oral hypoglycemic drugs: Secondary | ICD-10-CM | POA: Diagnosis not present

## 2019-02-18 DIAGNOSIS — K8050 Calculus of bile duct without cholangitis or cholecystitis without obstruction: Secondary | ICD-10-CM | POA: Diagnosis not present

## 2019-02-18 DIAGNOSIS — E785 Hyperlipidemia, unspecified: Secondary | ICD-10-CM | POA: Insufficient documentation

## 2019-02-18 DIAGNOSIS — T85590A Other mechanical complication of bile duct prosthesis, initial encounter: Secondary | ICD-10-CM | POA: Insufficient documentation

## 2019-02-18 DIAGNOSIS — Z86711 Personal history of pulmonary embolism: Secondary | ICD-10-CM | POA: Diagnosis not present

## 2019-02-18 DIAGNOSIS — J449 Chronic obstructive pulmonary disease, unspecified: Secondary | ICD-10-CM | POA: Insufficient documentation

## 2019-02-18 DIAGNOSIS — Z79899 Other long term (current) drug therapy: Secondary | ICD-10-CM | POA: Insufficient documentation

## 2019-02-18 DIAGNOSIS — E119 Type 2 diabetes mellitus without complications: Secondary | ICD-10-CM | POA: Insufficient documentation

## 2019-02-18 DIAGNOSIS — K219 Gastro-esophageal reflux disease without esophagitis: Secondary | ICD-10-CM | POA: Insufficient documentation

## 2019-02-18 DIAGNOSIS — I110 Hypertensive heart disease with heart failure: Secondary | ICD-10-CM | POA: Diagnosis not present

## 2019-02-18 DIAGNOSIS — G4730 Sleep apnea, unspecified: Secondary | ICD-10-CM | POA: Insufficient documentation

## 2019-02-18 DIAGNOSIS — Z87891 Personal history of nicotine dependence: Secondary | ICD-10-CM | POA: Insufficient documentation

## 2019-02-18 DIAGNOSIS — I2510 Atherosclerotic heart disease of native coronary artery without angina pectoris: Secondary | ICD-10-CM | POA: Insufficient documentation

## 2019-02-18 DIAGNOSIS — I509 Heart failure, unspecified: Secondary | ICD-10-CM | POA: Diagnosis not present

## 2019-02-18 DIAGNOSIS — Z96641 Presence of right artificial hip joint: Secondary | ICD-10-CM | POA: Insufficient documentation

## 2019-02-18 DIAGNOSIS — Z7901 Long term (current) use of anticoagulants: Secondary | ICD-10-CM | POA: Insufficient documentation

## 2019-02-18 DIAGNOSIS — Y838 Other surgical procedures as the cause of abnormal reaction of the patient, or of later complication, without mention of misadventure at the time of the procedure: Secondary | ICD-10-CM | POA: Insufficient documentation

## 2019-02-18 DIAGNOSIS — F329 Major depressive disorder, single episode, unspecified: Secondary | ICD-10-CM | POA: Diagnosis not present

## 2019-02-18 HISTORY — DX: Abnormal weight loss: R63.4

## 2019-02-18 HISTORY — DX: Full incontinence of feces: R15.9

## 2019-02-18 HISTORY — DX: Other lesions of oral mucosa: K13.79

## 2019-02-18 HISTORY — DX: Calculus of bile duct without cholangitis or cholecystitis without obstruction: K80.50

## 2019-02-18 HISTORY — PX: REMOVAL OF STONES: SHX5545

## 2019-02-18 HISTORY — DX: Personal history of (healed) traumatic fracture: Z87.81

## 2019-02-18 HISTORY — PX: ERCP: SHX5425

## 2019-02-18 HISTORY — PX: STENT REMOVAL: SHX6421

## 2019-02-18 HISTORY — PX: SPHINCTEROTOMY: SHX5544

## 2019-02-18 LAB — GLUCOSE, CAPILLARY: Glucose-Capillary: 100 mg/dL — ABNORMAL HIGH (ref 70–99)

## 2019-02-18 SURGERY — ERCP, WITH INTERVENTION IF INDICATED
Anesthesia: General

## 2019-02-18 MED ORDER — DEXAMETHASONE SODIUM PHOSPHATE 10 MG/ML IJ SOLN
INTRAMUSCULAR | Status: DC | PRN
  Administered 2019-02-18: 10 mg via INTRAVENOUS

## 2019-02-18 MED ORDER — GLUCAGON HCL RDNA (DIAGNOSTIC) 1 MG IJ SOLR
INTRAMUSCULAR | Status: AC
  Filled 2019-02-18: qty 1

## 2019-02-18 MED ORDER — FENTANYL CITRATE (PF) 100 MCG/2ML IJ SOLN
INTRAMUSCULAR | Status: AC
  Filled 2019-02-18: qty 2

## 2019-02-18 MED ORDER — LIDOCAINE 2% (20 MG/ML) 5 ML SYRINGE
INTRAMUSCULAR | Status: DC | PRN
  Administered 2019-02-18: 100 mg via INTRAVENOUS

## 2019-02-18 MED ORDER — SUGAMMADEX SODIUM 200 MG/2ML IV SOLN
INTRAVENOUS | Status: DC | PRN
  Administered 2019-02-18: 200 mg via INTRAVENOUS

## 2019-02-18 MED ORDER — SODIUM CHLORIDE 0.9 % IV SOLN
INTRAVENOUS | Status: DC | PRN
  Administered 2019-02-18: 30 mL

## 2019-02-18 MED ORDER — ONDANSETRON HCL 4 MG/2ML IJ SOLN
INTRAMUSCULAR | Status: DC | PRN
  Administered 2019-02-18: 4 mg via INTRAVENOUS

## 2019-02-18 MED ORDER — CIPROFLOXACIN IN D5W 400 MG/200ML IV SOLN
INTRAVENOUS | Status: DC | PRN
  Administered 2019-02-18: 400 mg via INTRAVENOUS

## 2019-02-18 MED ORDER — PHENYLEPHRINE 40 MCG/ML (10ML) SYRINGE FOR IV PUSH (FOR BLOOD PRESSURE SUPPORT)
PREFILLED_SYRINGE | INTRAVENOUS | Status: DC | PRN
  Administered 2019-02-18: 80 ug via INTRAVENOUS

## 2019-02-18 MED ORDER — INDOMETHACIN 50 MG RE SUPP
RECTAL | Status: AC
  Filled 2019-02-18: qty 2

## 2019-02-18 MED ORDER — LACTATED RINGERS IV SOLN
INTRAVENOUS | Status: DC
  Administered 2019-02-18: 12:00:00 via INTRAVENOUS

## 2019-02-18 MED ORDER — SODIUM CHLORIDE 0.9 % IV SOLN: INTRAVENOUS | Status: DC

## 2019-02-18 MED ORDER — FENTANYL CITRATE (PF) 100 MCG/2ML IJ SOLN
INTRAMUSCULAR | Status: DC | PRN
  Administered 2019-02-18 (×2): 50 ug via INTRAVENOUS

## 2019-02-18 MED ORDER — PROPOFOL 10 MG/ML IV BOLUS
INTRAVENOUS | Status: DC | PRN
  Administered 2019-02-18: 140 mg via INTRAVENOUS

## 2019-02-18 MED ORDER — CIPROFLOXACIN IN D5W 400 MG/200ML IV SOLN
INTRAVENOUS | Status: AC
  Filled 2019-02-18: qty 200

## 2019-02-18 MED ORDER — ROCURONIUM BROMIDE 10 MG/ML (PF) SYRINGE
PREFILLED_SYRINGE | INTRAVENOUS | Status: DC | PRN
  Administered 2019-02-18: 60 mg via INTRAVENOUS

## 2019-02-18 MED ORDER — PROPOFOL 10 MG/ML IV BOLUS
INTRAVENOUS | Status: AC
  Filled 2019-02-18: qty 20

## 2019-02-18 NOTE — Op Note (Signed)
[facility] Patient Name: Brandon [name] Procedure Date: 02/18/2019 MRN: [id number] Attending MD: Clarene Essex , MD Date of Birth: 20-Nov-1940 CSN: [id number] Age: 78 Admit Type: Outpatient Procedure:                ERCP Indications:              For therapy of bile duct stone(s) difficult to                            remove Providers:                Clarene Essex, MD, Cleda Daub, RN, Elspeth Cho                            Tech., Technician, [facility], CRNA Referring MD:              Medicines:                General Anesthesia Complications:            No immediate complications. Estimated Blood Loss:     Estimated blood loss: none. Procedure:                Pre-Anesthesia Assessment:                           - Prior to the procedure, a History and Physical                            was performed, and patient medications and                            allergies were reviewed. The patient's tolerance of                            previous anesthesia was also reviewed. The risks                            and benefits of the procedure and the sedation                            options and risks were discussed with the patient.                            All questions were answered, and informed consent                            was obtained. Prior Anticoagulants: The patient has                            taken Coumadin (warfarin), last dose was 5 days                            prior to procedure. ASA Grade Assessment: III - A  patient with severe systemic disease. After                            reviewing the risks and benefits, the patient was                            deemed in satisfactory condition to undergo the                            procedure.                           After obtaining informed consent, the scope was                            passed under direct vision. Throughout the   procedure, the patient's blood pressure, pulse, and                            oxygen saturations were monitored continuously. The                            TJF-Q180V ([id number] Olympus duodenoscope was                            introduced through the mouth, and used to inject                            contrast into and used to cannulate the bile duct.                            The ERCP was accomplished without difficulty. The                            patient tolerated the procedure well. We did do                            this procedure on his left side which made                            maintaining scope position easier Scope In: Scope Out: Findings:      One plastic stent originating in the biliary tree was emerging from the       major papilla. The stent was partially occluded. One stent was removed       from the biliary tree using a snare and sent for cytology. The biliary       sphincterotomy was extended with a Hydratome sphincterotome using ERBE       electrocautery. There was no post-sphincterotomy bleeding. We could get       the fully bowed sphincterotome easily in and out of the duct and the       biliary tree was swept with an adjustable 9- 12 mm balloon starting at       the bifurcation. We only use the 12 mm balloon which passed readily       through  the patent sphincterotomy site and sludge was swept from the       duct. A few stones were removed. No stones remained. The patient readily       drained with occlusion cholangiogram and just to be sure the bile duct       was explored endoscopically using the SpyGlass direct visualization       system. The SpyScope was advanced to the upper third of the main bile       duct. Visibility with the scope was good. The CBD was normal except for       some remaining mucus in the duct and after the spy scope was slowly       withdrawn through the main CBD no residual stones were seen we re-swept       the duct a few times  again using the 12 mm balloon and again on       occlusion cholangiogram there was no remaining abnormalities and there       was excellent biliary drainage and there was no pancreatic duct       injection or wire advancement throughout the procedure and the scope was       removed and the patient tolerated the procedure well. Impression:               - One partially occluded stent from the biliary                            tree was seen in the major papilla.                           - Choledocholithiasis was found. Complete removal                            was accomplished by biliary sphincterotomy and                            balloon extraction.                           - One stent was removed from the biliary tree.                           - A biliary sphincterotomy was performed.                           - The biliary tree was swept at the end of the                            procedure and nothing remained and we did proceed                            with cholangioscopy just to be sure as above. Moderate Sedation:      Not Applicable - Patient had care per Anesthesia. Recommendation:           - Clear liquid diet for 6 hours. Slowly advance  either later today or tomorrow if doing well                           - Resume Coumadin (warfarin) at prior dose tomorrow.                           - Await cytology results just to be sure.                           - Return to GI clinic PRN.                           - Telephone GI clinic if symptomatic PRN.                           - Telephone GI clinic for pathology results in 1                            week. Procedure Code(s):        --- Professional ---                           [id number], Endoscopic retrograde                            cholangiopancreatography (ERCP); with removal of                            foreign body(s) or stent(s) from biliary/pancreatic                            duct(s)                            43264, Endoscopic retrograde                            cholangiopancreatography (ERCP); with removal of                            calculi/debris from biliary/pancreatic duct(s)                           43262, Endoscopic retrograde                            cholangiopancreatography (ERCP); with                            sphincterotomy/papillotomy                           43273, Endoscopic cannulation of papilla with                            direct visualization of pancreatic/common bile  duct(s) (List separately in addition to code(s) for                            primary procedure) Diagnosis Code(s):        --- Professional ---                           T85.590A, Other mechanical complication of bile                            duct prosthesis, initial encounter                           K80.50, Calculus of bile duct without cholangitis                            or cholecystitis without obstruction                           Z46.59, Encounter for fitting and adjustment of                            other gastrointestinal appliance and device CPT copyright 2019 American [employer] Association. All rights reserved. The codes documented in this report are preliminary and upon coder review may  be revised to meet current compliance requirements. Vida Rigger, MD 02/18/2019 2:35:48 PM This report has been signed electronically. Number of Addenda: 0

## 2019-02-18 NOTE — Anesthesia Procedure Notes (Signed)
Procedure Name: Intubation Date/Time: 02/18/2019 1:25 PM Performed by: Judythe Postema D, CRNA Pre-anesthesia Checklist: Patient identified, Emergency Drugs available, Suction available and Patient being monitored Patient Re-evaluated:Patient Re-evaluated prior to induction Oxygen Delivery Method: Circle system utilized Preoxygenation: Pre-oxygenation with 100% oxygen Induction Type: IV induction Ventilation: Mask ventilation without difficulty Grade View: Grade I Tube type: Oral Tube size: 7.5 mm Number of attempts: 1 Airway Equipment and Method: Stylet Placement Confirmation: ETT inserted through vocal cords under direct vision,  positive ETCO2 and breath sounds checked- equal and bilateral Secured at: 22 cm Tube secured with: Tape Dental Injury: Teeth and Oropharynx as per pre-operative assessment

## 2019-02-18 NOTE — Anesthesia Postprocedure Evaluation (Signed)
Anesthesia Post Note  Patient: Brandon [name]  Procedure(s) Performed: ENDOSCOPIC RETROGRADE CHOLANGIOPANCREATOGRAPHY (ERCP) (N/A ) STENT REMOVAL REMOVAL OF STONES SPHINCTEROTOMY     Patient location during evaluation: PACU Anesthesia Type: General Level of consciousness: awake and alert Pain management: pain level controlled Vital Signs Assessment: post-procedure vital signs reviewed and stable Respiratory status: spontaneous breathing, nonlabored ventilation, respiratory function stable and patient connected to nasal cannula oxygen Cardiovascular status: blood pressure returned to baseline and stable Postop Assessment: no apparent nausea or vomiting Anesthetic complications: no    Last Vitals:  Vitals:   02/18/19 1450 02/18/19 1500  BP: (!) 148/67 (!) 177/70  Pulse: 69 69  Resp: 13 16  Temp:    SpO2: 98% 96%    Last Pain:  Vitals:   02/18/19 1437  TempSrc: Other (Comment)  PainSc: 0-No pain                 Effie Berkshire

## 2019-02-18 NOTE — Transfer of Care (Signed)
Immediate Anesthesia Transfer of Care Note  Patient: Brandon [name]  Procedure(s) Performed: ENDOSCOPIC RETROGRADE CHOLANGIOPANCREATOGRAPHY (ERCP) (N/A )  Patient Location: PACU  Anesthesia Type:General  Level of Consciousness: awake, alert  and oriented  Airway & Oxygen Therapy: Patient Spontanous Breathing and Patient connected to nasal cannula oxygen  Post-op Assessment: Report given to RN and Post -op Vital signs reviewed and stable  Post vital signs: Reviewed and stable  Last Vitals:  Vitals Value Taken Time  BP    Temp    Pulse    Resp    SpO2      Last Pain:  Vitals:   02/18/19 1148  TempSrc: Oral  PainSc: 7          Complications: No apparent anesthesia complications

## 2019-02-18 NOTE — Discharge Instructions (Signed)
° °  Call if question or problems otherwise clear liquid diet until 8 PM tonight and if doing well may have soft solids slowly advance diet tomorrow and follow-up in the office or on video health if ongoing GI issues or questions otherwise follow-up with your dentist or primary care physician if mouth and jaw pain continue and call for tissue sample results from the CBD in 1 week obtain from the stent done just to be sure.    YOU HAD AN ENDOSCOPIC PROCEDURE TODAY: Refer to the procedure report and other information in the discharge instructions given to you for any specific questions about what was found during the examination. If this information does not answer your questions, please call [facility] office at [phone] to clarify.   YOU SHOULD EXPECT: Some feelings of bloating in the abdomen. Passage of more gas than usual. Walking can help get rid of the air that was put into your GI tract during the procedure and reduce the bloating. If you had a lower endoscopy (such as a colonoscopy or flexible sigmoidoscopy) you may notice spotting of blood in your stool or on the toilet paper. Some abdominal soreness may be present for a day or two, also.  DIET: Your first meal following the procedure should be a light meal and then it is ok to progress to your normal diet. A half-sandwich or bowl of soup is an example of a good first meal. Heavy or fried foods are harder to digest and may make you feel nauseous or bloated. Drink plenty of fluids but you should avoid alcoholic beverages for 24 hours. If you had a esophageal dilation, please see attached instructions for diet.   ACTIVITY: Your care partner should take you home directly after the procedure. You should plan to take it easy, moving slowly for the rest of the day. You can resume normal activity the day after the procedure however YOU SHOULD NOT DRIVE, use power tools, machinery or perform tasks that involve climbing or major physical exertion for 24  hours (because of the sedation medicines used during the test).   SYMPTOMS TO REPORT IMMEDIATELY: A gastroenterologist can be reached at any hour. Please call [phone]  for any of the following symptoms:    Following upper endoscopy (EGD, EUS, ERCP, esophageal dilation) Vomiting of blood or coffee ground material  New, significant abdominal pain  New, significant chest pain or pain under the shoulder blades  Painful or persistently difficult swallowing  New shortness of breath  Black, tarry-looking or red, bloody stools  FOLLOW UP:  If any biopsies were taken you will be contacted by phone or by letter within the next 1-3 weeks. Call [phone]  if you have not heard about the biopsies in 3 weeks.  Please also call with any specific questions about appointments or follow up tests.

## 2019-02-18 NOTE — Progress Notes (Signed)
Brandon [name] 1:12 PM  Subjective: Patient has been bothered by his mouth and jaw since his last procedure and noticed his abdomen is much and has no new complaints and we reviewed his previous procedures and answered all of his questions  Objective: No signs stable afebrile exam please see preassessment evaluation no new lab work  Assessment: Difficult to remove CBD stones  Plan: Okay to proceed with ERCP with anesthesia assistance with further work-up plans and future recommendations pending those findings  [facility] E  office [phone] After 5PM or if no answer call [phone]

## 2019-02-18 NOTE — Anesthesia Preprocedure Evaluation (Addendum)
Anesthesia Evaluation  Patient identified by MRN, date of birth, ID band Patient awake    Reviewed: Allergy & Precautions, NPO status , Patient's Chart, lab work & pertinent test results  Airway Mallampati: II  TM Distance: >3 FB Neck ROM: Full    Dental no notable dental hx.    Pulmonary sleep apnea , former smoker,    Pulmonary exam normal breath sounds clear to auscultation       Cardiovascular hypertension, negative cardio ROS Normal cardiovascular exam Rhythm:Regular Rate:Normal     Neuro/Psych negative neurological ROS  negative psych ROS   GI/Hepatic negative GI ROS, Neg liver ROS,   Endo/Other  negative endocrine ROSdiabetes  Renal/GU negative Renal ROS  negative genitourinary   Musculoskeletal negative musculoskeletal ROS (+)   Abdominal   Peds negative pediatric ROS (+)  Hematology negative hematology ROS (+)   Anesthesia Other Findings   Reproductive/Obstetrics negative OB ROS                             Anesthesia Physical Anesthesia Plan  ASA: III  Anesthesia Plan: General   Post-op Pain Management:    Induction: Intravenous  PONV Risk Score and Plan: 2 and Ondansetron, Dexamethasone and Treatment may vary due to age or medical condition  Airway Management Planned: Oral ETT  Additional Equipment:   Intra-op Plan:   Post-operative Plan: Extubation in OR  Informed Consent: I have reviewed the patients History and Physical, chart, labs and discussed the procedure including the risks, benefits and alternatives for the proposed anesthesia with the patient or authorized representative who has indicated his/her understanding and acceptance.     Dental advisory given  Plan Discussed with: CRNA and Surgeon  Anesthesia Plan Comments: (Patient c/o significant L sided jaw pain both at TMJ area as well as intraorally upper and lower. I examined his airway and area he  claims is sore. I could not see  any discoloration or ulcer in his mouth at areas he says are sore. )       Anesthesia Quick Evaluation

## 2019-02-22 ENCOUNTER — Encounter (HOSPITAL_COMMUNITY): Payer: Self-pay | Admitting: Gastroenterology

## 2019-05-21 DIAGNOSIS — E559 Vitamin D deficiency, unspecified: Secondary | ICD-10-CM | POA: Insufficient documentation

## 2019-10-28 DIAGNOSIS — A0471 Enterocolitis due to Clostridium difficile, recurrent: Secondary | ICD-10-CM | POA: Insufficient documentation

## 2020-02-14 DIAGNOSIS — M65332 Trigger finger, left middle finger: Secondary | ICD-10-CM | POA: Insufficient documentation

## 2020-02-14 IMAGING — RF ERCP
1 series · 15 of 24 positions shown · non-contrast
Comparison: Intraoperative cholangiogram on 12/31/2018

CLINICAL DATA: Evidence of choledocholithiasis by intraoperative
cholangiogram.

EXAM:
ERCP
TECHNIQUE: Multiple spot images obtained with the fluoroscopic device and
submitted for interpretation post-procedure.

[Series 1: run · 18 acquisitions, 15 frames shown]
[im 1/18]
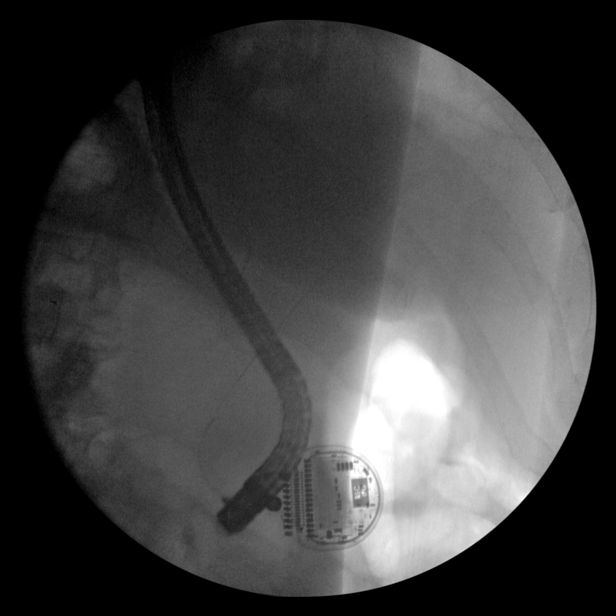
[im 2/18]
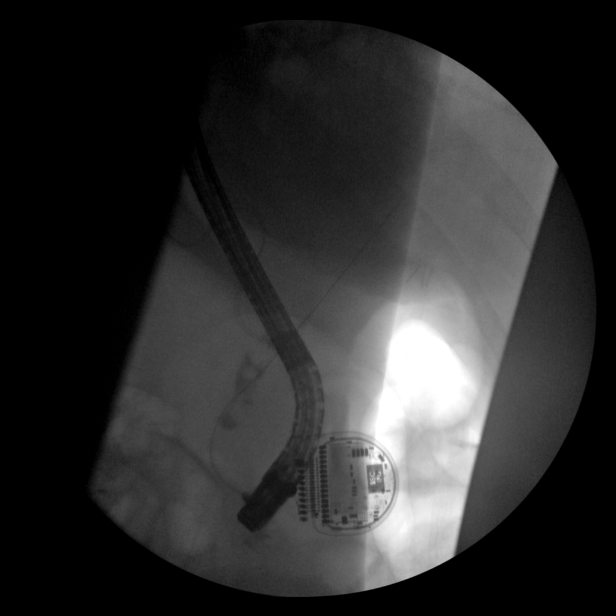
[im 4/18]
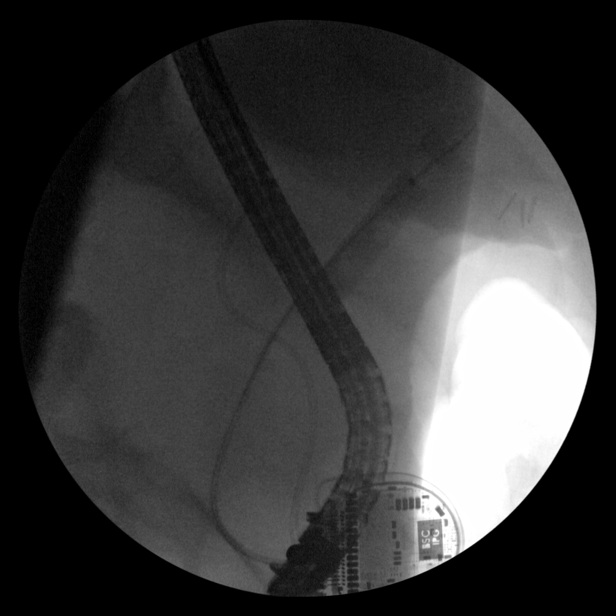
[im 6/18]
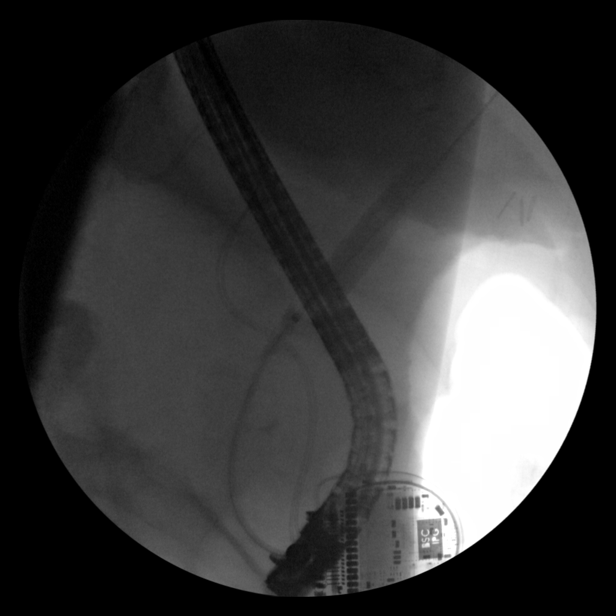
[im 6/18]
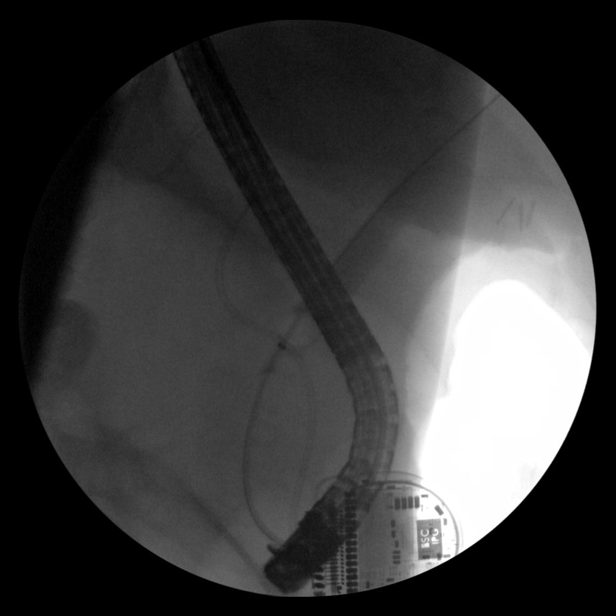
[im 7/18]
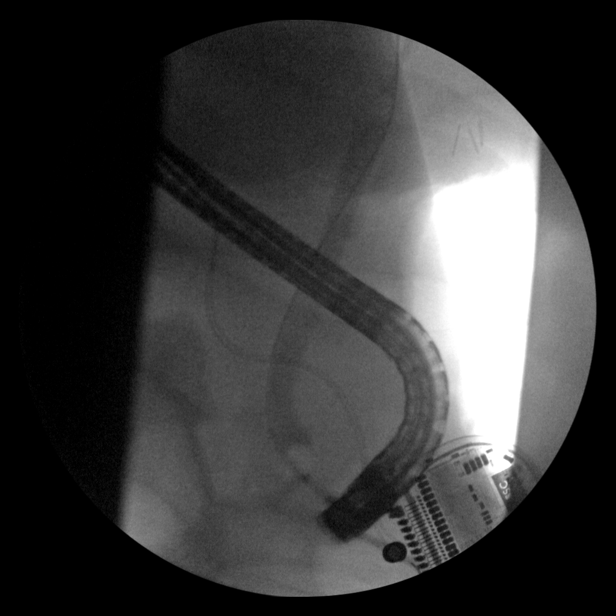
[im 10/18]
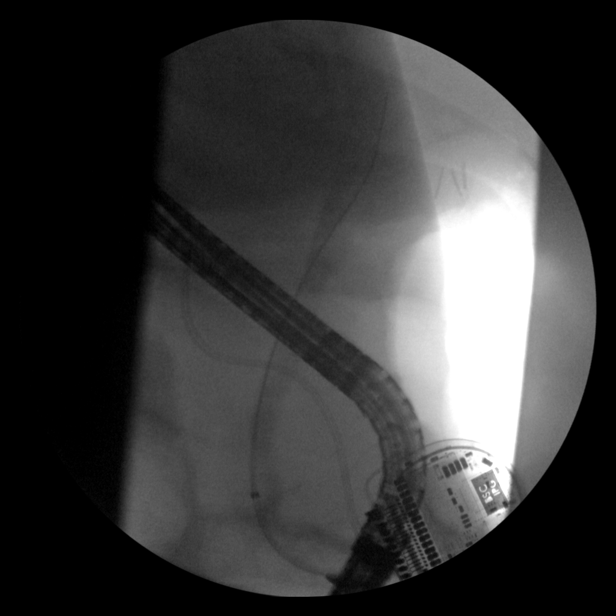
[im 10/18]
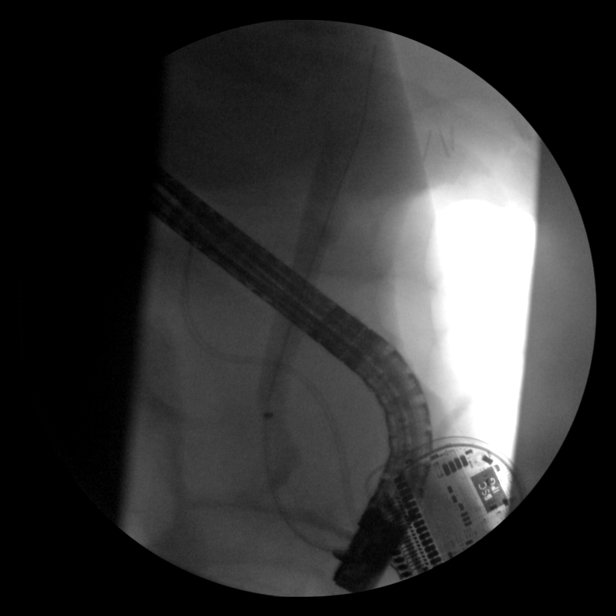
[im 12/18]
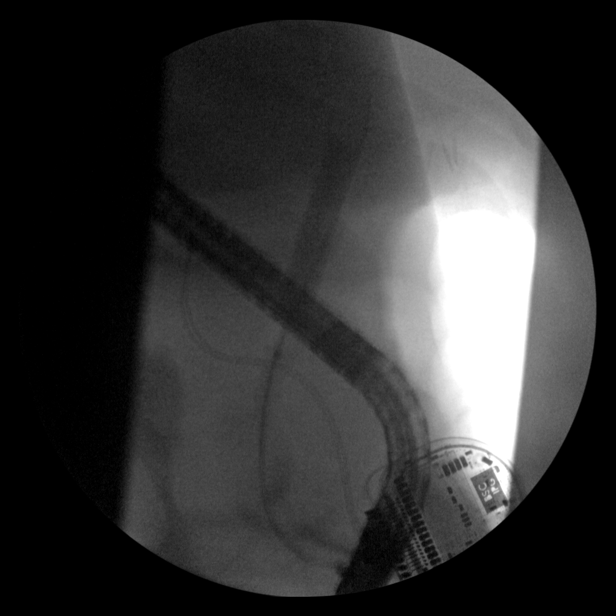
[im 14/18]
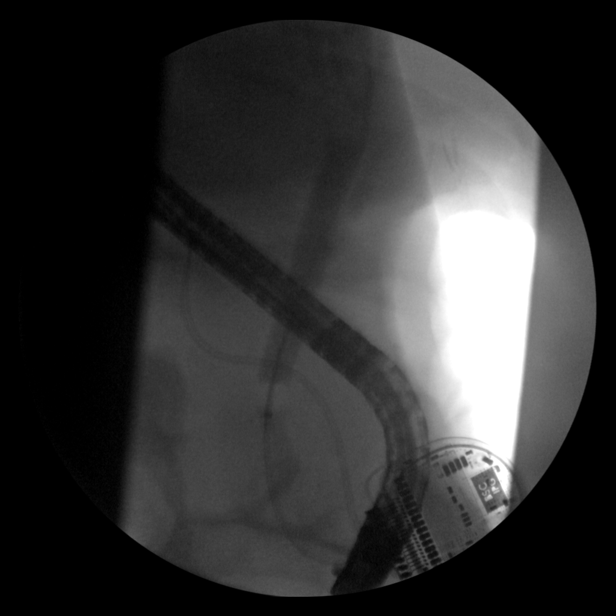
[im 14/18]
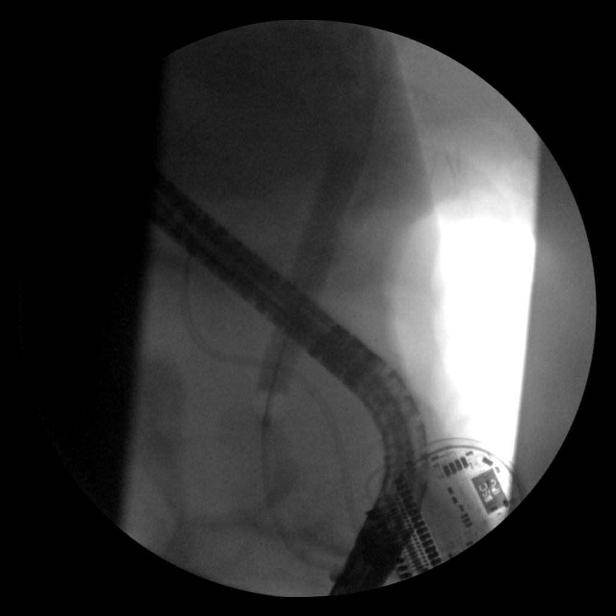
[im 15/18]
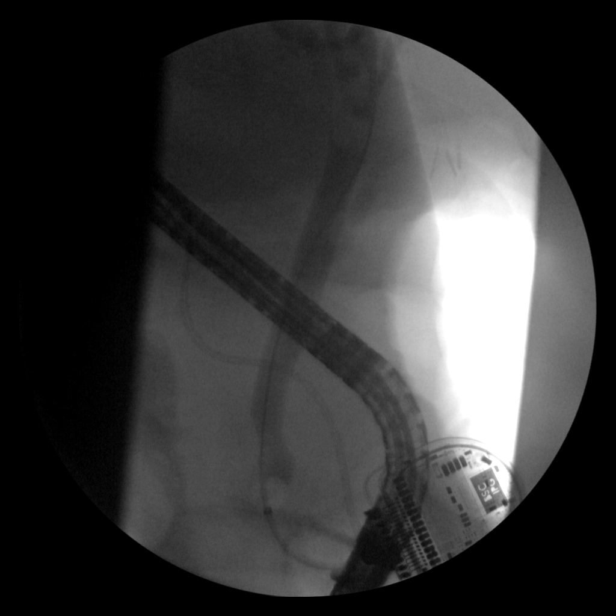
[im 16/18]
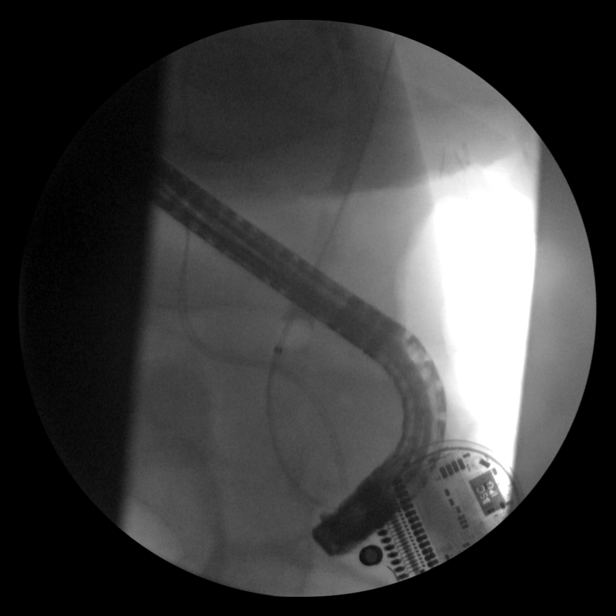
[im 16/18]
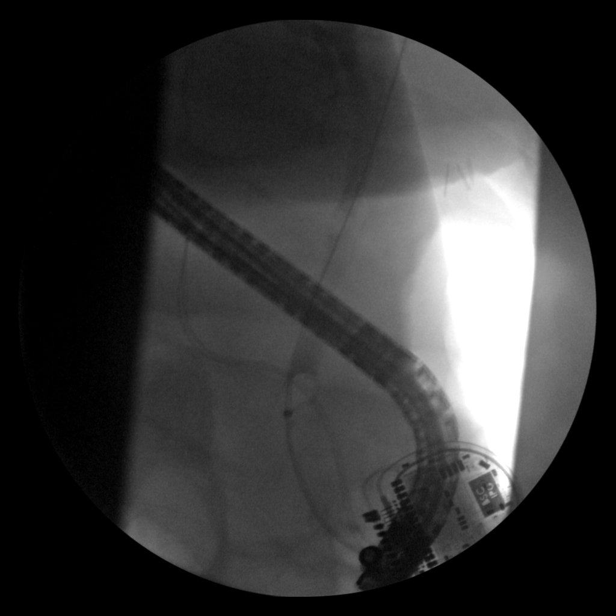
[im 18/18]
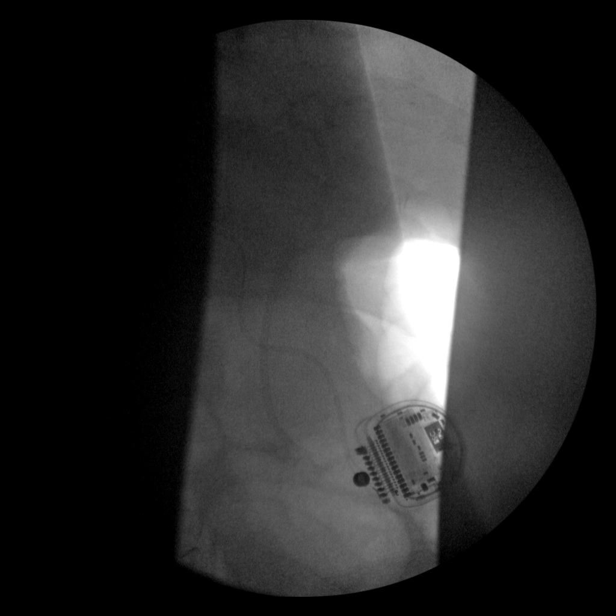

[15 of 24 positions shown; findings below may reference images not displayed]

FINDINGS: Intraoperative imaging obtained with a C-arm shows endoscopic
cannulation of the common bile duct with contrast injection
demonstrating multiple filling defects in the common bile duct.
Balloon sweep maneuvers were performed. Final image also shows
placement of a biliary stent in the common bile duct.
IMPRESSION: Balloon sweep maneuvers performed to remove calculi from the common
bile duct. An endoscopic biliary stent was also placed in the common
bile duct.

These images were submitted for radiologic interpretation only.
Please see the procedural report for the amount of contrast and the
fluoroscopy time utilized.

## 2020-08-09 ENCOUNTER — Other Ambulatory Visit: Payer: Self-pay

## 2020-11-14 ENCOUNTER — Ambulatory Visit: Payer: Medicare HMO | Admitting: Sports Medicine

## 2020-11-14 ENCOUNTER — Other Ambulatory Visit: Payer: Self-pay

## 2020-11-14 ENCOUNTER — Encounter: Payer: Self-pay | Admitting: Sports Medicine

## 2020-11-14 DIAGNOSIS — B351 Tinea unguium: Secondary | ICD-10-CM

## 2020-11-14 DIAGNOSIS — M79675 Pain in left toe(s): Secondary | ICD-10-CM | POA: Diagnosis not present

## 2020-11-14 DIAGNOSIS — M216X9 Other acquired deformities of unspecified foot: Secondary | ICD-10-CM | POA: Diagnosis not present

## 2020-11-14 DIAGNOSIS — M79674 Pain in right toe(s): Secondary | ICD-10-CM

## 2020-11-14 DIAGNOSIS — E1142 Type 2 diabetes mellitus with diabetic polyneuropathy: Secondary | ICD-10-CM | POA: Diagnosis not present

## 2020-11-14 DIAGNOSIS — M2040 Other hammer toe(s) (acquired), unspecified foot: Secondary | ICD-10-CM

## 2020-11-14 DIAGNOSIS — I739 Peripheral vascular disease, unspecified: Secondary | ICD-10-CM

## 2020-11-14 DIAGNOSIS — M722 Plantar fascial fibromatosis: Secondary | ICD-10-CM

## 2020-11-14 MED ORDER — TRIAMCINOLONE ACETONIDE 40 MG/ML IJ SUSP
20.0000 mg | Freq: Once | INTRAMUSCULAR | Status: AC
  Administered 2020-11-14: 20 mg

## 2020-11-14 NOTE — Progress Notes (Signed)
Subjective: Brandon [name] is a 80 y.o. male patient with history of diabetes who presents to office today complaining of long,mildly painful nails  while ambulating in shoes; unable to trim. Patient also reports that he has some burning and needlelike pain to the bottoms of both feet that sometimes radiate up the back of his legs pain is sharp 8 out of 10 worse with pressure reports that he is on Percocet for his chronic pain and even this medication does not give him complete relief pain is still 8 out of 10 even with pain medication.  Patient reports that this pain started after he had history of blood clots 2 to 3 years ago and reports that he has had issues with his back as well sees pain management who has him on Percocet and reports that he also gets Cymbalta but is not sure if this is given by his PCP or his pain doctor.  Patient reports that he has seen multiple doctors in the past and all they wanted to do is just trim his toenails and not really offer him any options for to try to help to give him some relief.  Review of Systems  Neurological: Positive for tingling.  All other systems reviewed and are negative.    Patient Active Problem List   Diagnosis Date Noted  . Trigger middle finger of left hand 02/14/2020  . 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  . Vitamin D deficiency 05/21/2019  . 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  . Pulmonary embolism on long-term anticoagulation therapy (HCC) 12/29/2018  . DM (diabetes mellitus) (HCC) 12/29/2018  . Chronic diastolic CHF (congestive heart failure) (HCC) 12/28/2018  . Choledocholithiasis 12/28/2018  . CKD (chronic kidney disease) stage 3, GFR 30-59 ml/min (HCC) 07/30/2018  . Class 1 obesity due to excess calories with serious comorbidity and body mass index (BMI) of 30.0 to 30.9 in adult 01/07/2018  . Benign prostatic hyperplasia with urinary frequency 12/02/2017  . Balance disorder 08/22/2017  . History of  pulmonary embolism 02/19/2017  . Hammertoe of right foot 05/16/2016  . Callus of foot 05/16/2016  . Diabetic polyneuropathy associated with type 2 diabetes mellitus (HCC) 04/16/2016  . Onychomycosis due to dermatophyte 04/16/2016  . Right foot pain 04/16/2016  . Primary osteoarthritis involving multiple joints 04/15/2016  . Elevated hemidiaphragm 01/24/2016  . Gait abnormality 01/24/2016  . Essential hypertension 11/22/2015  . Anticoagulant long-term use 11/22/2015  . Cervical disc disease 11/22/2015  . Gastroesophageal reflux disease 11/22/2015  . Gout 11/22/2015  . High risk medication use 11/22/2015  . Lung mass 11/22/2015  . Major depressive disorder with current active episode 11/22/2015  . Mixed hyperlipidemia 11/22/2015  . Malaise and fatigue 11/22/2015  . Testosterone deficiency 11/22/2015  . Vitamin B12 deficiency 11/22/2015  . Erectile dysfunction 11/22/2015  . Edema 11/22/2015   Current Outpatient Medications on File Prior to Visit  Medication Sig Dispense Refill  . clotrimazole-betamethasone (LOTRISONE) cream Apply sparingly to rash twice daily as needed.    . DULoxetine (CYMBALTA) 30 MG capsule Take 30 mg by mouth daily.    . ergocalciferol (VITAMIN D2) 1.25 MG (50000 UT) capsule Take 1 capsule by mouth once a week.    . febuxostat (ULORIC) 40 MG tablet Take 40 mg by mouth daily.    . fenofibrate 160 MG tablet Take 160 mg by mouth daily.    . finasteride (PROSCAR) 5 MG tablet Take 5 mg by mouth daily.    . fosinopril (MONOPRIL) 40 MG  tablet Take 40 mg by mouth daily.    . mupirocin ointment (BACTROBAN) 2 % Apply topically.    Marland Kitchen omeprazole (PRILOSEC) 10 MG capsule Take 10 mg by mouth daily.    Marland Kitchen oxyCODONE-acetaminophen (PERCOCET) 10-325 MG tablet Take 1 tablet by mouth 2 (two) times a day.    . warfarin (COUMADIN) 5 MG tablet Take 1 tablet (5 mg total) by mouth daily. (Patient taking differently: Take 5 mg by mouth every evening.)     No current facility-administered  medications on file prior to visit.   Allergies  Allergen Reactions  . Statins     Cramps (ALLERGY/intolerance)  . Allopurinol Rash    Unknown  . Doxycycline Rash    Unknown    No results found for this or any previous visit (from the past 2160 hour(s)).  Objective: General: Patient is awake, alert, and oriented x 3 and in no acute distress.  Integument: Skin is warm, dry and supple bilateral. Nails are tender, long, thickened and  dystrophic with subungual debris, consistent with onychomycosis, 1-5 bilateral. No signs of infection. No open lesions or preulcerative lesions present bilateral. Remaining integument unremarkable.  Vasculature:  Dorsalis Pedis pulse 1/4 bilateral. Posterior Tibial pulse 0/4 bilateral.  Capillary fill time <5 sec 1-5 bilateral.  No hair growth to the level of the digits. Temperature gradient within normal limits. No varicosities present bilateral.  1+ pitting edema present bilateral.   Neurology: The patient has absent sensation measured with a 5.07/10g Semmes Weinstein Monofilament at all pedal sites bilateral . Vibratory sensation absent bilateral with tuning fork. No Babinski sign present bilateral.   Musculoskeletal: Asymptomatic hammertoe pedal deformities noted bilateral. Muscular strength 4/5 in all lower extremity muscular groups bilateral without pain on range of motion.  There is significant pain to palpation along the plantar fascial course extending into the arch and at both heels . No tenderness with calf compression bilateral.  Assessment and Plan: Problem List Items Addressed This Visit      Endocrine   Diabetic polyneuropathy associated with type 2 diabetes mellitus (HCC)    Other Visit Diagnoses    Pain due to onychomycosis of toenails of both feet    -  Primary   Relevant Medications   clotrimazole-betamethasone (LOTRISONE) cream   mupirocin ointment (BACTROBAN) 2 %   Plantar fasciitis, bilateral       Acquired equinus deformity of  foot, unspecified laterality       PVD (peripheral vascular disease) (HCC)       Hammer toe, unspecified laterality          -Examined patient. -Discussed and educated patient on diabetic foot care, especially with  regards to the vascular, neurological and musculoskeletal systems.  -Stressed the importance of good glycemic control and the detriment of not  controlling glucose levels in relation to the foot. -Mechanically debrided all nails 1-5 bilateral using sterile nail nipper and filed with dremel without incident  -Patient declined xrays this visit  -After oral consent and aseptic prep, injected a mixture containing 1 ml of 2%  plain lidocaine, 1 ml 0.5% plain marcaine, 0.5 ml of kenalog 40 and 0.5 ml of dexamethasone phosphate into bilateral heels at plantar fascial insertion to see if this will give some relief without complication. Post-injection care discussed with patient.  -I advised patient that I do feel like his pain is strongly related to neuropathy and advised him that since he has very distinct pain on the plantar surfaces we will try an  injection as above however this may still not solve his problem of pain but at least we will try since no one else has tried this before for him -Added additional heel lifts to bilateral shoes -Continue with good supportive sketchers with memory foam cushion -Continue with chronic pain medicines as given by pain management and Cymbalta -Answered all patient questions -Patient to return  in 3 months for at risk foot care -Patient advised to call the office if any problems or questions arise in the meantime.  Asencion Islam, DPM

## 2020-11-21 ENCOUNTER — Other Ambulatory Visit: Payer: Self-pay | Admitting: Sports Medicine

## 2020-11-21 MED ORDER — CLINDAMYCIN HCL 300 MG PO CAPS: 300.0000 mg | ORAL_CAPSULE | Freq: Three times a day (TID) | ORAL | 0 refills | Status: DC

## 2020-11-21 NOTE — Progress Notes (Signed)
Sent Clinda for red toe joint

## 2020-12-04 DIAGNOSIS — D638 Anemia in other chronic diseases classified elsewhere: Secondary | ICD-10-CM | POA: Insufficient documentation

## 2021-02-14 ENCOUNTER — Other Ambulatory Visit: Payer: Self-pay

## 2021-02-14 ENCOUNTER — Ambulatory Visit: Payer: Medicare HMO | Admitting: Sports Medicine

## 2021-02-14 ENCOUNTER — Encounter: Payer: Self-pay | Admitting: Sports Medicine

## 2021-02-14 DIAGNOSIS — M79674 Pain in right toe(s): Secondary | ICD-10-CM

## 2021-02-14 DIAGNOSIS — B351 Tinea unguium: Secondary | ICD-10-CM | POA: Diagnosis not present

## 2021-02-14 DIAGNOSIS — E1142 Type 2 diabetes mellitus with diabetic polyneuropathy: Secondary | ICD-10-CM

## 2021-02-14 DIAGNOSIS — G8929 Other chronic pain: Secondary | ICD-10-CM

## 2021-02-14 DIAGNOSIS — I739 Peripheral vascular disease, unspecified: Secondary | ICD-10-CM | POA: Diagnosis not present

## 2021-02-14 DIAGNOSIS — M79675 Pain in left toe(s): Secondary | ICD-10-CM

## 2021-02-14 DIAGNOSIS — Z7901 Long term (current) use of anticoagulants: Secondary | ICD-10-CM

## 2021-02-14 DIAGNOSIS — M79673 Pain in unspecified foot: Secondary | ICD-10-CM

## 2021-02-14 NOTE — Progress Notes (Signed)
Subjective: Brandon [name] is a 80 y.o. male patient with history of neuropathy likely secondary to old history of diabetes versus his back currently on chronic pain medication including Cymbalta and blood thinner of Coumadin.  Patient reports that previous injection only helped for 2 days.  Patient is also here for nail care.  No other pedal complaints noted.  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   Vitamin D deficiency 05/21/2019   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   Pulmonary embolism on long-term anticoagulation therapy (HCC) 12/29/2018   DM (diabetes mellitus) (HCC) 12/29/2018   Chronic diastolic CHF (congestive heart failure) (HCC) 12/28/2018   Choledocholithiasis 12/28/2018   CKD (chronic kidney disease) stage 3, GFR 30-59 ml/min (HCC) 07/30/2018   Class 1 obesity due to excess calories with serious comorbidity and body mass index (BMI) of 30.0 to 30.9 in adult 01/07/2018   Benign prostatic hyperplasia with urinary frequency 12/02/2017   Balance disorder 08/22/2017   History of pulmonary embolism 02/19/2017   Hammertoe of right foot 05/16/2016   Callus of foot 05/16/2016   Diabetic polyneuropathy associated with type 2 diabetes mellitus (HCC) 04/16/2016   Onychomycosis due to dermatophyte 04/16/2016   Right foot pain 04/16/2016   Primary osteoarthritis involving multiple joints 04/15/2016   Elevated hemidiaphragm 01/24/2016   Gait abnormality 01/24/2016   Essential hypertension 11/22/2015   Anticoagulant long-term use 11/22/2015   Cervical disc disease 11/22/2015   Gastroesophageal reflux disease 11/22/2015   Gout 11/22/2015   High risk medication use 11/22/2015   Lung mass 11/22/2015   Major depressive disorder with current active episode 11/22/2015   Mixed hyperlipidemia 11/22/2015   Malaise and fatigue 11/22/2015   Testosterone deficiency  11/22/2015   Vitamin B12 deficiency 11/22/2015   Erectile dysfunction 11/22/2015   Edema 11/22/2015   Current Outpatient Medications on File Prior to Visit  Medication Sig Dispense Refill   clindamycin (CLEOCIN) 300 MG capsule Take 1 capsule (300 mg total) by mouth 3 (three) times daily. 30 capsule 0   clotrimazole-betamethasone (LOTRISONE) cream Apply sparingly to rash twice daily as needed.     DULoxetine (CYMBALTA) 30 MG capsule Take 30 mg by mouth daily.     ergocalciferol (VITAMIN D2) 1.25 MG (50000 UT) capsule Take 1 capsule by mouth once a week.     febuxostat (ULORIC) 40 MG tablet Take 40 mg by mouth daily.     fenofibrate 160 MG tablet Take 160 mg by mouth daily.     finasteride (PROSCAR) 5 MG tablet Take 5 mg by mouth daily.     fosinopril (MONOPRIL) 40 MG tablet Take 40 mg by mouth daily.     mupirocin ointment (BACTROBAN) 2 % Apply topically.     omeprazole (PRILOSEC) 10 MG capsule Take 10 mg by mouth daily.     oxyCODONE-acetaminophen (PERCOCET) 10-325 MG tablet Take 1 tablet by mouth 2 (two) times a day.     warfarin (COUMADIN) 5 MG tablet Take 1 tablet (5 mg total) by mouth daily. (Patient taking differently: Take 5 mg by mouth every evening.)     No current facility-administered medications on file prior to visit.   Allergies  Allergen Reactions   Statins     Cramps (ALLERGY/intolerance)   Allopurinol Rash    Unknown   Doxycycline Rash    Unknown    No results found for this or any previous visit (  from the past 2160 hour(s)).  Objective: General: Patient is awake, alert, and oriented x 3 and in no acute distress.  Integument: Skin is warm, dry and supple bilateral. Nails are tender, long, thickened and dystrophic with subungual debris, consistent with onychomycosis, 1-5 bilateral. No signs of infection. No open lesions or preulcerative lesions present bilateral. Remaining integument unremarkable.   Vasculature:  Dorsalis Pedis pulse 1/4 bilateral. Posterior  Tibial pulse 0/4 bilateral. Capillary fill time <5 sec 1-5 bilateral.  No hair growth to the level of the digits. Temperature gradient within normal limits. No varicosities present bilateral.  1+ pitting edema present bilateral.   Neurology: The patient has absent sensation measured with a 5.07/10g Semmes Weinstein Monofilament at all pedal sites bilateral unchanged from prior.   Musculoskeletal: Asymptomatic hammertoe pedal deformities noted bilateral. Muscular strength 4/5 in all lower extremity muscular groups bilateral without pain on range of motion.  Patient has diffuse pain plantar surfaces of both feet and reports that it is a constant burning pain current medications do not help.  Assessment and Plan: Problem List Items Addressed This Visit       Endocrine   Diabetic polyneuropathy associated with type 2 diabetes mellitus (HCC)   Other Visit Diagnoses     Pain due to onychomycosis of toenails of both feet    -  Primary   PVD (peripheral vascular disease) (HCC)       Current use of anticoagulant therapy       Chronic foot pain, unspecified laterality           -Examined patient. -Discussed and educated patient on foot care and neuropathy with no relief from previous injection to heels -Advised patient since his pain medication given by his pain doctor and the Cymbalta is not helping may benefit from seeing a physical medicine and rehab doctor for alternative treatment options advised patient to discuss this with his pain doctor when he goes to see him on tomorrow -Advised patient to also try over-the-counter Nervive to see if this can give him some relief of his symptoms -Mechanically debrided all nails 1-5 bilateral using sterile nail nipper and filed with dremel without incident  -Answered all patient questions -Patient to return  in 3 months for at risk foot care -Patient advised to call the office if any problems or questions arise in the meantime.  Advised patient due to  severity of his symptoms there may not be any type of acute or relief that he can ever get.  Asencion Islam, DPM

## 2021-02-14 NOTE — Patient Instructions (Signed)
Get OTC nervive suppliment for nerve pains  Recommend you to see a Physical Medicine and Rehab doctor [facility] Claudette Laws, MD Specialties and/or Subspecialties Physiatry, Physical Medicine & Rehabilitation Neuromuscular Medicine [phone]

## 2021-05-02 DIAGNOSIS — M791 Myalgia, unspecified site: Secondary | ICD-10-CM | POA: Insufficient documentation

## 2021-05-18 ENCOUNTER — Ambulatory Visit: Payer: Medicare HMO | Admitting: Sports Medicine

## 2021-08-15 DIAGNOSIS — M51369 Other intervertebral disc degeneration, lumbar region without mention of lumbar back pain or lower extremity pain: Secondary | ICD-10-CM | POA: Insufficient documentation

## 2021-09-21 ENCOUNTER — Observation Stay (HOSPITAL_COMMUNITY): Payer: Medicare HMO

## 2021-09-21 ENCOUNTER — Observation Stay (HOSPITAL_COMMUNITY)
Admission: EM | Admit: 2021-09-21 | Discharge: 2021-09-25 | Disposition: A | Discharge status: Skilled Nursing Facility | Payer: Medicare HMO | Attending: Internal Medicine | Admitting: Internal Medicine

## 2021-09-21 ENCOUNTER — Emergency Department (HOSPITAL_COMMUNITY): Payer: Medicare HMO

## 2021-09-21 ENCOUNTER — Other Ambulatory Visit: Payer: Self-pay

## 2021-09-21 ENCOUNTER — Encounter (HOSPITAL_COMMUNITY): Payer: Self-pay

## 2021-09-21 DIAGNOSIS — I160 Hypertensive urgency: Secondary | ICD-10-CM | POA: Diagnosis not present

## 2021-09-21 DIAGNOSIS — M5137 Other intervertebral disc degeneration, lumbosacral region: Secondary | ICD-10-CM | POA: Insufficient documentation

## 2021-09-21 DIAGNOSIS — M50322 Other cervical disc degeneration at C5-C6 level: Secondary | ICD-10-CM | POA: Insufficient documentation

## 2021-09-21 DIAGNOSIS — J9811 Atelectasis: Secondary | ICD-10-CM | POA: Diagnosis not present

## 2021-09-21 DIAGNOSIS — R269 Unspecified abnormalities of gait and mobility: Secondary | ICD-10-CM | POA: Insufficient documentation

## 2021-09-21 DIAGNOSIS — R29898 Other symptoms and signs involving the musculoskeletal system: Secondary | ICD-10-CM

## 2021-09-21 DIAGNOSIS — E1169 Type 2 diabetes mellitus with other specified complication: Secondary | ICD-10-CM | POA: Diagnosis not present

## 2021-09-21 DIAGNOSIS — E1142 Type 2 diabetes mellitus with diabetic polyneuropathy: Secondary | ICD-10-CM | POA: Insufficient documentation

## 2021-09-21 DIAGNOSIS — Z20822 Contact with and (suspected) exposure to covid-19: Secondary | ICD-10-CM | POA: Insufficient documentation

## 2021-09-21 DIAGNOSIS — G9341 Metabolic encephalopathy: Secondary | ICD-10-CM | POA: Diagnosis not present

## 2021-09-21 DIAGNOSIS — M47812 Spondylosis without myelopathy or radiculopathy, cervical region: Secondary | ICD-10-CM | POA: Insufficient documentation

## 2021-09-21 DIAGNOSIS — I130 Hypertensive heart and chronic kidney disease with heart failure and stage 1 through stage 4 chronic kidney disease, or unspecified chronic kidney disease: Secondary | ICD-10-CM | POA: Insufficient documentation

## 2021-09-21 DIAGNOSIS — E119 Type 2 diabetes mellitus without complications: Secondary | ICD-10-CM

## 2021-09-21 DIAGNOSIS — I5033 Acute on chronic diastolic (congestive) heart failure: Secondary | ICD-10-CM | POA: Insufficient documentation

## 2021-09-21 DIAGNOSIS — R2689 Other abnormalities of gait and mobility: Secondary | ICD-10-CM | POA: Diagnosis not present

## 2021-09-21 DIAGNOSIS — N183 Chronic kidney disease, stage 3 unspecified: Secondary | ICD-10-CM | POA: Insufficient documentation

## 2021-09-21 DIAGNOSIS — R410 Disorientation, unspecified: Secondary | ICD-10-CM | POA: Diagnosis not present

## 2021-09-21 DIAGNOSIS — E782 Mixed hyperlipidemia: Secondary | ICD-10-CM

## 2021-09-21 DIAGNOSIS — M48061 Spinal stenosis, lumbar region without neurogenic claudication: Secondary | ICD-10-CM | POA: Diagnosis not present

## 2021-09-21 DIAGNOSIS — I5032 Chronic diastolic (congestive) heart failure: Secondary | ICD-10-CM

## 2021-09-21 DIAGNOSIS — M4726 Other spondylosis with radiculopathy, lumbar region: Secondary | ICD-10-CM | POA: Insufficient documentation

## 2021-09-21 DIAGNOSIS — M4802 Spinal stenosis, cervical region: Secondary | ICD-10-CM | POA: Insufficient documentation

## 2021-09-21 DIAGNOSIS — G9389 Other specified disorders of brain: Secondary | ICD-10-CM | POA: Insufficient documentation

## 2021-09-21 DIAGNOSIS — R4781 Slurred speech: Secondary | ICD-10-CM | POA: Insufficient documentation

## 2021-09-21 DIAGNOSIS — Z7901 Long term (current) use of anticoagulants: Secondary | ICD-10-CM | POA: Diagnosis not present

## 2021-09-21 DIAGNOSIS — Z87891 Personal history of nicotine dependence: Secondary | ICD-10-CM | POA: Insufficient documentation

## 2021-09-21 DIAGNOSIS — E1122 Type 2 diabetes mellitus with diabetic chronic kidney disease: Secondary | ICD-10-CM | POA: Diagnosis not present

## 2021-09-21 DIAGNOSIS — N401 Enlarged prostate with lower urinary tract symptoms: Secondary | ICD-10-CM | POA: Diagnosis not present

## 2021-09-21 DIAGNOSIS — Z86711 Personal history of pulmonary embolism: Secondary | ICD-10-CM | POA: Diagnosis not present

## 2021-09-21 DIAGNOSIS — R531 Weakness: Secondary | ICD-10-CM | POA: Diagnosis present

## 2021-09-21 DIAGNOSIS — Z0389 Encounter for observation for other suspected diseases and conditions ruled out: Secondary | ICD-10-CM | POA: Diagnosis not present

## 2021-09-21 DIAGNOSIS — I2510 Atherosclerotic heart disease of native coronary artery without angina pectoris: Secondary | ICD-10-CM | POA: Insufficient documentation

## 2021-09-21 DIAGNOSIS — Z79899 Other long term (current) drug therapy: Secondary | ICD-10-CM | POA: Insufficient documentation

## 2021-09-21 DIAGNOSIS — M5116 Intervertebral disc disorders with radiculopathy, lumbar region: Secondary | ICD-10-CM | POA: Insufficient documentation

## 2021-09-21 DIAGNOSIS — G9340 Encephalopathy, unspecified: Secondary | ICD-10-CM

## 2021-09-21 DIAGNOSIS — R350 Frequency of micturition: Secondary | ICD-10-CM

## 2021-09-21 LAB — APTT: aPTT: 30 seconds (ref 24–36)

## 2021-09-21 LAB — ETHANOL: Alcohol, Ethyl (B): <10 mg/dL (ref <10)

## 2021-09-21 LAB — PROTIME-INR
INR: 1.6 — ABNORMAL HIGH (ref 0.8–1.2)
Prothrombin Time: 18.9 seconds — ABNORMAL HIGH (ref 11.4–15.2)

## 2021-09-21 LAB — TSH: TSH: 0.478 u[IU]/mL (ref 0.350–4.500)

## 2021-09-21 LAB — URINALYSIS, ROUTINE W REFLEX MICROSCOPIC
Bilirubin Urine: NEGATIVE
Glucose, UA: NEGATIVE mg/dL
Hgb urine dipstick: NEGATIVE
Ketones, ur: NEGATIVE mg/dL
Leukocytes,Ua: NEGATIVE
Nitrite: NEGATIVE
Protein, ur: NEGATIVE mg/dL
Specific Gravity, Urine: 1.015 (ref 1.005–1.030)
pH: 7 (ref 5.0–8.0)

## 2021-09-21 LAB — I-STAT CHEM 8, ED
BUN: 12 mg/dL (ref 8–23)
Calcium, Ion: 1.17 mmol/L (ref 1.15–1.40)
Chloride: 104 mmol/L (ref 98–111)
Creatinine, Ser: 0.8 mg/dL (ref 0.61–1.24)
Glucose, Bld: 127 mg/dL — ABNORMAL HIGH (ref 70–99)
HCT: 44 % (ref 39.0–52.0)
Hemoglobin: 15 g/dL (ref 13.0–17.0)
Potassium: 4.2 mmol/L (ref 3.5–5.1)
Sodium: 139 mmol/L (ref 135–145)
TCO2: 25 mmol/L (ref 22–32)

## 2021-09-21 LAB — COMPREHENSIVE METABOLIC PANEL
ALT: 19 U/L (ref 0–44)
AST: 31 U/L (ref 15–41)
Albumin: 3.8 g/dL (ref 3.5–5.0)
Alkaline Phosphatase: 71 U/L (ref 38–126)
Anion gap: 11 (ref 5–15)
BUN: 9 mg/dL (ref 8–23)
CO2: 23 mmol/L (ref 22–32)
Calcium: 9.3 mg/dL (ref 8.9–10.3)
Chloride: 105 mmol/L (ref 98–111)
Creatinine, Ser: 0.96 mg/dL (ref 0.61–1.24)
GFR, Estimated: >60 mL/min (ref >60)
Glucose, Bld: 128 mg/dL — ABNORMAL HIGH (ref 70–99)
Potassium: 4.3 mmol/L (ref 3.5–5.1)
Sodium: 139 mmol/L (ref 135–145)
Total Bilirubin: 1.1 mg/dL (ref 0.3–1.2)
Total Protein: 7.2 g/dL (ref 6.5–8.1)

## 2021-09-21 LAB — CBC
HCT: 43.7 % (ref 39.0–52.0)
Hemoglobin: 13.8 g/dL (ref 13.0–17.0)
MCH: 28.7 pg (ref 26.0–34.0)
MCHC: 31.6 g/dL (ref 30.0–36.0)
MCV: 90.9 fL (ref 80.0–100.0)
Platelets: 181 10*3/uL (ref 150–400)
RBC: 4.81 MIL/uL (ref 4.22–5.81)
RDW: 14.5 % (ref 11.5–15.5)
WBC: 9.9 10*3/uL (ref 4.0–10.5)
nRBC: 0 % (ref 0.0–0.2)

## 2021-09-21 LAB — TROPONIN I (HIGH SENSITIVITY)
Troponin I (High Sensitivity): 18 ng/L — ABNORMAL HIGH (ref <18)
Troponin I (High Sensitivity): 21 ng/L — ABNORMAL HIGH (ref <18)

## 2021-09-21 LAB — RESP PANEL BY RT-PCR (FLU A&B, COVID) ARPGX2
Influenza A by PCR: NEGATIVE
Influenza B by PCR: NEGATIVE
SARS Coronavirus 2 by RT PCR: NEGATIVE

## 2021-09-21 LAB — DIFFERENTIAL
Abs Immature Granulocytes: 0.04 10*3/uL (ref 0.00–0.07)
Basophils Absolute: 0.1 10*3/uL (ref 0.0–0.1)
Basophils Relative: 1 %
Eosinophils Absolute: 0.1 10*3/uL (ref 0.0–0.5)
Eosinophils Relative: 1 %
Immature Granulocytes: 0 %
Lymphocytes Relative: 17 %
Lymphs Abs: 1.6 10*3/uL (ref 0.7–4.0)
Monocytes Absolute: 1.7 10*3/uL — ABNORMAL HIGH (ref 0.1–1.0)
Monocytes Relative: 17 %
Neutro Abs: 6.3 10*3/uL (ref 1.7–7.7)
Neutrophils Relative %: 64 %

## 2021-09-21 LAB — LACTIC ACID, PLASMA: Lactic Acid, Venous: 1.3 mmol/L (ref 0.5–1.9)

## 2021-09-21 LAB — RAPID URINE DRUG SCREEN, HOSP PERFORMED
Amphetamines: NOT DETECTED
Barbiturates: NOT DETECTED
Benzodiazepines: NOT DETECTED
Cocaine: NOT DETECTED
Opiates: NOT DETECTED
Tetrahydrocannabinol: NOT DETECTED

## 2021-09-21 LAB — CBG MONITORING, ED: Glucose-Capillary: 125 mg/dL — ABNORMAL HIGH (ref 70–99)

## 2021-09-21 LAB — BRAIN NATRIURETIC PEPTIDE: B Natriuretic Peptide: 110.9 pg/mL — ABNORMAL HIGH (ref 0.0–100.0)

## 2021-09-21 MED ORDER — ACETAMINOPHEN 325 MG PO TABS
650.0000 mg | ORAL_TABLET | Freq: Four times a day (QID) | ORAL | Status: DC | PRN
  Administered 2021-09-23 (×2): 650 mg via ORAL
  Filled 2021-09-21 (×2): qty 2

## 2021-09-21 MED ORDER — DULOXETINE HCL 30 MG PO CPEP
30.0000 mg | ORAL_CAPSULE | Freq: Every evening | ORAL | Status: DC
  Administered 2021-09-21 – 2021-09-24 (×4): 30 mg via ORAL
  Filled 2021-09-21 (×4): qty 1

## 2021-09-21 MED ORDER — WARFARIN SODIUM 7.5 MG PO TABS
7.5000 mg | ORAL_TABLET | Freq: Once | ORAL | Status: AC
  Administered 2021-09-21: 7.5 mg via ORAL
  Filled 2021-09-21: qty 1

## 2021-09-21 MED ORDER — ALBUTEROL SULFATE (2.5 MG/3ML) 0.083% IN NEBU: 2.5000 mg | INHALATION_SOLUTION | Freq: Four times a day (QID) | RESPIRATORY_TRACT | Status: DC | PRN

## 2021-09-21 MED ORDER — OXYCODONE-ACETAMINOPHEN 10-325 MG PO TABS: 1.0000 | ORAL_TABLET | Freq: Three times a day (TID) | ORAL | Status: DC | PRN

## 2021-09-21 MED ORDER — ONDANSETRON HCL 4 MG PO TABS: 4.0000 mg | ORAL_TABLET | Freq: Four times a day (QID) | ORAL | Status: DC | PRN

## 2021-09-21 MED ORDER — ONDANSETRON HCL 4 MG/2ML IJ SOLN: 4.0000 mg | Freq: Four times a day (QID) | INTRAMUSCULAR | Status: DC | PRN

## 2021-09-21 MED ORDER — LISINOPRIL 10 MG PO TABS
10.0000 mg | ORAL_TABLET | Freq: Every day | ORAL | Status: DC
  Administered 2021-09-22 – 2021-09-25 (×4): 10 mg via ORAL
  Filled 2021-09-21 (×4): qty 1

## 2021-09-21 MED ORDER — FENOFIBRATE 160 MG PO TABS
160.0000 mg | ORAL_TABLET | Freq: Every day | ORAL | Status: DC
Start: 2021-09-21 — End: 2021-09-25
  Administered 2021-09-21 – 2021-09-24 (×4): 160 mg via ORAL
  Filled 2021-09-21 (×4): qty 1

## 2021-09-21 MED ORDER — FUROSEMIDE 10 MG/ML IJ SOLN
40.0000 mg | Freq: Two times a day (BID) | INTRAMUSCULAR | Status: DC
  Administered 2021-09-21: 40 mg via INTRAVENOUS
  Filled 2021-09-21: qty 4

## 2021-09-21 MED ORDER — OXYCODONE HCL 5 MG PO TABS
5.0000 mg | ORAL_TABLET | Freq: Three times a day (TID) | ORAL | Status: DC | PRN
  Administered 2021-09-23 – 2021-09-24 (×2): 5 mg via ORAL
  Filled 2021-09-21 (×2): qty 1

## 2021-09-21 MED ORDER — WARFARIN - PHARMACIST DOSING INPATIENT: Freq: Every day | Status: DC

## 2021-09-21 MED ORDER — OXYCODONE-ACETAMINOPHEN 5-325 MG PO TABS
1.0000 | ORAL_TABLET | Freq: Three times a day (TID) | ORAL | Status: DC | PRN
  Administered 2021-09-24 (×2): 1 via ORAL
  Filled 2021-09-21 (×2): qty 1

## 2021-09-21 MED ORDER — PANTOPRAZOLE SODIUM 20 MG PO TBEC
20.0000 mg | DELAYED_RELEASE_TABLET | Freq: Every day | ORAL | Status: DC
  Administered 2021-09-21 – 2021-09-25 (×5): 20 mg via ORAL
  Filled 2021-09-21 (×6): qty 1

## 2021-09-21 MED ORDER — FINASTERIDE 5 MG PO TABS
5.0000 mg | ORAL_TABLET | Freq: Every day | ORAL | Status: DC
  Administered 2021-09-21 – 2021-09-23 (×3): 5 mg via ORAL
  Filled 2021-09-21 (×3): qty 1

## 2021-09-21 MED ORDER — IOHEXOL 350 MG/ML SOLN
75.0000 mL | Freq: Once | INTRAVENOUS | Status: AC | PRN
  Administered 2021-09-21: 75 mL via INTRAVENOUS

## 2021-09-21 MED ORDER — ENOXAPARIN SODIUM 100 MG/ML IJ SOSY
100.0000 mg | PREFILLED_SYRINGE | Freq: Two times a day (BID) | INTRAMUSCULAR | Status: DC
  Administered 2021-09-21 – 2021-09-25 (×8): 100 mg via SUBCUTANEOUS
  Filled 2021-09-21 (×10): qty 1

## 2021-09-21 MED ORDER — ACETAMINOPHEN 650 MG RE SUPP: 650.0000 mg | Freq: Four times a day (QID) | RECTAL | Status: DC | PRN

## 2021-09-21 MED ORDER — FUROSEMIDE 10 MG/ML IJ SOLN
40.0000 mg | Freq: Two times a day (BID) | INTRAMUSCULAR | Status: AC
  Administered 2021-09-22: 40 mg via INTRAVENOUS
  Filled 2021-09-21: qty 4

## 2021-09-21 MED ORDER — SODIUM CHLORIDE 0.9% FLUSH
3.0000 mL | Freq: Two times a day (BID) | INTRAVENOUS | Status: DC
  Administered 2021-09-21 – 2021-09-25 (×9): 3 mL via INTRAVENOUS

## 2021-09-21 MED ORDER — TAMSULOSIN HCL 0.4 MG PO CAPS
0.4000 mg | ORAL_CAPSULE | Freq: Every day | ORAL | Status: DC
  Administered 2021-09-21 – 2021-09-23 (×3): 0.4 mg via ORAL
  Filled 2021-09-21 (×3): qty 1

## 2021-09-21 NOTE — ED Triage Notes (Signed)
Pt arrived via [facility] w c/o slurred speech, weakness, and headache 7/10 on pain scale

## 2021-09-21 NOTE — ED Notes (Signed)
Unable to complete MRI d/t an unidentified device in back

## 2021-09-21 NOTE — ED Notes (Signed)
ED Provider at bedside. 

## 2021-09-21 NOTE — Consult Note (Signed)
Neurology Consultation Reason for Consult:  Concern for Stroke Referring Physician: Pollina, C  CC: dysarthria  History is obtained from:Patient  HPI: Brandon [name] is a 81 y.o. male with a history of CHF, diabetes, hyperlipidemia, hypertension who presents with mild confusion, dysarthria in the setting of worsening lower extremity edema.  He also complains of bilateral lower extremity weakness.  His wife noticed around 4 PM that he was getting progressively worse, and therefore called 911.  He was brought in to the emergency department as a code stroke and taken for CT/CTA which was negative.   LKW: 4 pm tpa given?: no, out of window  Past Medical History:  Diagnosis Date   Benign prostatic hyperplasia (BPH) with urinary urgency    Bile duct stone    Cervical disc disease    CHF (congestive heart failure) (HCC)    Chronic kidney disease (CKD), stage III (moderate) (HCC)    pt unaware   Coronary artery disease    pt unaware   Diabetes mellitus without complication (HCC)    Diabetes, polyneuropathy (HCC)    Diastolic dysfunction    ED (erectile dysfunction)    Edema    Elevated hemidiaphragm    Elevated liver function tests    Gait abnormality    Gallstones    GERD (gastroesophageal reflux disease)    Gout    History of anticoagulant therapy    History of rib fracture    Hyperlipidemia    Hypertension    Lung mass    Major depression    Malaise and fatigue    Mouth pain    OA (osteoarthritis)    Pneumonia    history of   Prostate disease    Pulmonary emboli (HCC)    Sleep apnea    does not use CPAP, resolved   Stool incontinence    Testosterone deficiency    Vitamin B 12 deficiency    Weight loss      Family History  Problem Relation Age of Onset   Heart disease Father        Had heart surgery in his 10's     Social History:  reports that he quit smoking about 36 years ago. He started smoking about 46 years ago. He has a 2.50 pack-year smoking  history. He has never used smokeless tobacco. He reports that he does not drink alcohol and does not use drugs.   Exam: Current vital signs: There were no vitals taken for this visit. Vital signs in last 24 hours:     Physical Exam  Constitutional: Appears well-developed and well-nourished.  Psych: Affect appropriate to situation Cardiovascular: Significant lower extremity edema  Neuro: Mental Status: Patient is awake, alert, oriented to person, place, month, year,  Patient is able to give a clear and coherent history No signs of aphasia or neglect He has mildly dysarthric Cranial Nerves: II: Visual Fields are full. Pupils are equal, round, and reactive to light.   III,IV, VI: EOMI without ptosis or diploplia.  V: Facial sensation is symmetric to temperature VII: Facial movement is symmetric.  VIII: hearing is intact to voice X: Uvula elevates symmetrically XI: Shoulder shrug is symmetric. XII: tongue is midline without atrophy or fasciculations.  Motor: Tone is normal. Bulk is normal. 5/5 strength was present in bilateral arms, and his legs he is unable to lift either leg against gravity.  He is able to invert his feet bilaterally, but he gives poor effort when trying to plantarflex.  He  has poor effort when attempting to lift his legs. Sensory: Sensation is symmetric to light touch and temperature in the arms and legs.  He gives inconsistent answers when trying to check double simultaneous stimulation Cerebellar: No clear ataxia on finger-nose-finger     I have reviewed labs in epic and the results pertinent to this consultation are: Chem-8-unremarkable  I have reviewed the images obtained: CT/CTA-negative  Impression: 81 year old male with what I suspect is mild delirium resulting in dysarthria and confusion in the setting of an underlying medical condition.  Given that he is dysarthric, I think an MRI would be reasonable to rule out acute ischemic stroke, but if this  is negative then I would not pursue any further stroke work-up.  He is on narcotics, and medication effect could also be a consideration.  Recommendations: 1) MRI brain 2) assessment of edema, frequent urination per internal medicine 3) stroke work-up only if MRI is positive. 4) if MRI is negative, neurology will be available on an as-needed basis.   Roland Rack, MD [facility] [phone]  If 7pm- 7am, please page neurology on call as listed in [place].

## 2021-09-21 NOTE — H&P (Signed)
History and Physical    Brandon [name] [id number] DOB: [birth year] DOA: 09/21/2021  Referring MD/NP/PA: Davonna Belling, MD PCP: Raina Mina., MD  Patient coming from: Home via EMS  Chief Complaint: confusion and weakness  I have personally briefly reviewed patient's old medical records in [place]   HPI: Brandon [name] is a 81 y.o. 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  History is obtained from the patient and his wife over the phone.  At baseline patient ambulates with the use of a cane and has some memory issues after being on life support for a period in time after having bilateral pulmonary emboli.  She had last talked to him at 8:45 in the morning and he seemed to be in his normal state of health.  She came back home around 4 PM after she received a call from one of her neighbors that he had fallen in the yard.  She does not know how long he was outside on the ground.  It took 2 people to get him up and back into the house.  Since that time his wife states that he has been peeing every 15 minutes, has had urinary incontinence, and had been yawning repeatedly.  When she checked his blood pressure it was elevated up to 183/82.  Patient notes that his legs had been swelling over the last couple of days.  He denies any complaints of chest pain, but does complain of some shortness of breath with any activity.  ED Course: On admission into the emergency department patient was seen initially as a code stroke and was seen by neurology.  CT/CTA of the head and neck negative for any acute abnormality.  Patient was not a tPA candidate as he was out of the window.  Afebrile, pulse 53-94, blood pressures 141/72-199/93, and O2 saturations maintained on room air.  Labs were significant for BNP 110.9 and  high-sensitivity troponin 18->21.  Urinalysis did not note any signs of infection.  UDS was negative and alcohol level was undetectable.  Clinical and COVID-19 screening were negative.  Patient was ordered MRI of the brain.  TRH called to admit.  Review of Systems  Constitutional:  Negative for fever.  Eyes:  Negative for photophobia and pain.  Respiratory:  Positive for shortness of breath (With activity).   Cardiovascular:  Positive for leg swelling. Negative for chest pain.  Musculoskeletal:  Positive for back pain.  Psychiatric/Behavioral:  Positive for memory loss.    Past Medical History:  Diagnosis Date   Benign prostatic hyperplasia (BPH) with urinary urgency    Bile duct stone    Cervical disc disease    CHF (congestive heart failure) (HCC)    Chronic kidney disease (CKD), stage III (moderate) ([place])    pt unaware   Coronary artery disease    pt unaware   Diabetes mellitus without complication (HCC)    Diabetes, polyneuropathy (HCC)    Diastolic dysfunction    ED (erectile dysfunction)    Edema    Elevated hemidiaphragm    Elevated liver function tests    Gait abnormality    Gallstones    GERD (gastroesophageal reflux disease)    Gout    History of anticoagulant therapy    History of rib fracture    Hyperlipidemia    Hypertension    Lung mass  Major depression    Malaise and fatigue    Mouth pain    OA (osteoarthritis)    Pneumonia    history of   Prostate disease    Pulmonary emboli (HCC)    Sleep apnea    does not use CPAP, resolved   Stool incontinence    Testosterone deficiency    Vitamin B 12 deficiency    Weight loss     Past Surgical History:  Procedure Laterality Date   BILIARY DILATION  01/22/2019   Procedure: BILIARY DILATION;  Surgeon: Vida Rigger, MD;  Location: [facility];  Service: Endoscopy;;   BILIARY STENT PLACEMENT N/A 01/01/2019   Procedure: BILIARY STENT PLACEMENT;  Surgeon: Willis Modena, MD;  Location: [facility];  Service:  Endoscopy;  Laterality: N/A;   BILIARY STENT PLACEMENT N/A 01/22/2019   Procedure: BILIARY STENT PLACEMENT;  Surgeon: Vida Rigger, MD;  Location: [facility];  Service: Endoscopy;  Laterality: N/A;   CARDIAC CATHETERIZATION  2014   ERCP N/A 01/01/2019   Procedure: ENDOSCOPIC RETROGRADE CHOLANGIOPANCREATOGRAPHY (ERCP);  Surgeon: Willis Modena, MD;  Location: [facility];  Service: Endoscopy;  Laterality: N/A;   ERCP N/A 01/22/2019   Procedure: ENDOSCOPIC RETROGRADE CHOLANGIOPANCREATOGRAPHY (ERCP);  Surgeon: Vida Rigger, MD;  Location: [facility];  Service: Endoscopy;  Laterality: N/A;  with spyglass and lithrotipsy   ERCP N/A 02/18/2019   Procedure: ENDOSCOPIC RETROGRADE CHOLANGIOPANCREATOGRAPHY (ERCP);  Surgeon: Vida Rigger, MD;  Location: [facility];  Service: Endoscopy;  Laterality: N/A;  WITH SPYGLASS    LAPAROSCOPIC CHOLECYSTECTOMY SINGLE SITE WITH INTRAOPERATIVE CHOLANGIOGRAM N/A 12/31/2018   Procedure: LAPAROSCOPIC CHOLECYSTECTOMY SINGLE SITE WITH INTRAOPERATIVE CHOLANGIOGRAM, REPAIR OF UMBILICAL HERNIA;  Surgeon: Karie Soda, MD;  Location: [facility];  Service: General;  Laterality: N/A;   REMOVAL OF STONES  01/01/2019   Procedure: REMOVAL OF STONES;  Surgeon: Willis Modena, MD;  [facility];  Service: Endoscopy;;   REMOVAL OF STONES  01/22/2019   Procedure: REMOVAL OF STONES;  Surgeon: Vida Rigger, MD;  [facility];  Service: Endoscopy;;   REMOVAL OF STONES  02/18/2019   Procedure: REMOVAL OF STONES;  Surgeon: Vida Rigger, MD;  [facility];  Service: Endoscopy;;   right hip replacement     SPHINCTEROTOMY  01/01/2019   Procedure: Dennison Mascot;  Surgeon: Willis Modena, MD;  [facility];  Service: Endoscopy;;   SPHINCTEROTOMY  01/22/2019   Procedure: Dennison Mascot;  Surgeon: Vida Rigger, MD;  [facility];  Service: Endoscopy;;   SPHINCTEROTOMY  02/18/2019   Procedure: Dennison Mascot;  Surgeon: Vida Rigger, MD;  [facility];  Service: Endoscopy;;   SPYGLASS CHOLANGIOSCOPY N/A 01/22/2019   Procedure: ASNKNLZJ CHOLANGIOSCOPY;  Surgeon: Vida Rigger, MD;  [facility];  Service: Endoscopy;  Laterality: N/A;   SPYGLASS LITHOTRIPSY N/A 01/22/2019   Procedure: QBHALPFX LITHOTRIPSY;  Surgeon: Vida Rigger, MD;  [facility];  Service: Endoscopy;  Laterality: N/A;   STENT REMOVAL  01/22/2019   Procedure: STENT REMOVAL;  Surgeon: Vida Rigger, MD;  [facility];  Service: Endoscopy;;   STENT REMOVAL  02/18/2019   Procedure: STENT REMOVAL;  Surgeon: Vida Rigger, MD;  [facility];  Service: Endoscopy;;     reports that he quit smoking about 36 years ago. He started smoking about 46 years ago. He has a 2.50 pack-year smoking history. He has never used smokeless tobacco. He reports that he does not drink alcohol and does not use drugs.  Allergies  Allergen Reactions   Codeine Hives   Statins  Cramps (ALLERGY/intolerance)   Allopurinol Rash    Unknown   Doxycycline Rash    Unknown    Family History  Problem Relation Age of Onset   Heart disease Father        Had heart surgery in his 6's    Prior to Admission medications   Medication Sig Start Date End Date Taking? Authorizing Provider  DULoxetine (CYMBALTA) 30 MG capsule Take 30 mg by mouth every evening. 06/19/21  Yes [provider]  ergocalciferol (VITAMIN D2) 1.25 MG (50000 UT) capsule Take 50,000 Units by mouth every Monday. 06/19/20  Yes [provider]  fenofibrate 160 MG tablet Take 160 mg by mouth at bedtime. 12/17/18  Yes [provider]  finasteride (PROSCAR) 5 MG tablet Take 5 mg by mouth daily with lunch. 09/22/18  Yes [provider]  fosinopril (MONOPRIL) 10 MG tablet Take 10 mg by mouth daily. 09/28/18  Yes [provider]  omeprazole (PRILOSEC) 10 MG capsule Take 10 mg by mouth daily. 07/31/18  Yes [provider]  oxyCODONE-acetaminophen  (PERCOCET) 10-325 MG tablet Take 1 tablet by mouth every 8 (eight) hours as needed for pain.   Yes [provider]  Probiotic Product (ALIGN PO) Take 1 capsule by mouth daily.   Yes [provider]  tamsulosin (FLOMAX) 0.4 MG CAPS capsule Take 0.4 mg by mouth daily with lunch. 07/23/21  Yes [provider]  warfarin (COUMADIN) 5 MG tablet Take 1 tablet (5 mg total) by mouth daily. Patient taking differently: Take 5 mg by mouth every evening. 01/04/19  Yes Cristal Ford, DO    Physical Exam:  Constitutional: Male sitting up eating dinner at this time Vitals:   09/21/21 0930 09/21/21 0945 09/21/21 1000 09/21/21 1015  BP: (!) 164/74 (!) 141/72 (!) 145/73 (!) 144/70  Pulse: 69 67 69 69  Resp: 12 13 15 12   Temp:      TempSrc:      SpO2: 96% 95% 96% 94%   Eyes: PERRL, lids and conjunctivae normal ENMT: Mucous membranes are moist. Posterior pharynx clear of any exudate or lesions.  Neck: normal, supple, no masses, no thyromegaly Respiratory: clear to auscultation bilaterally, no wheezing, no crackles. Normal respiratory effort. No accessory muscle use.  Cardiovascular: Regular rate and rhythm, no murmurs / rubs / gallops. No extremity edema. 2+ pedal pulses. No carotid bruits.  LE Abdomen: no tenderness, no masses palpated. No hepatosplenomegaly. Bowel sounds positive.  Musculoskeletal: no clubbing / cyanosis. No joint deformity upper and lower extremities. Good ROM, no contractures. Normal muscle tone.  Skin: no rashes, lesions, ulcers. No induration Neurologic: CN 2-12 grossly intact. Sensation intact, DTR normal. Strength 5/5 in all 4.  Psychiatric: Normal judgment and insight. Alert and oriented x 3. Normal mood.     Labs on Admission: I have personally reviewed following labs and imaging studies  CBC: Recent Labs  Lab 09/21/21 0513 09/21/21 0523  WBC 9.9  --   NEUTROABS 6.3  --   HGB 13.8 15.0  HCT 43.7 44.0  MCV 90.9  --   PLT 181  --    Basic  Metabolic Panel: Recent Labs  Lab 09/21/21 0513 09/21/21 0523  NA 139 139  K 4.3 4.2  CL 105 104  CO2 23  --   GLUCOSE 128* 127*  BUN 9 12  CREATININE 0.96 0.80  CALCIUM 9.3  --    GFR: CrCl cannot be calculated (Unknown ideal weight.). Liver Function Tests: Recent Labs  Lab  09/21/21 0513  AST 31  ALT 19  ALKPHOS 71  BILITOT 1.1  PROT 7.2  ALBUMIN 3.8   No results for input(s): LIPASE, AMYLASE in the last 168 hours. No results for input(s): AMMONIA in the last 168 hours. Coagulation Profile: Recent Labs  Lab 09/21/21 0513  INR 1.6*   Cardiac Enzymes: No results for input(s): CKTOTAL, CKMB, CKMBINDEX, TROPONINI in the last 168 hours. BNP (last 3 results) No results for input(s): PROBNP in the last 8760 hours. HbA1C: No results for input(s): HGBA1C in the last 72 hours. CBG: Recent Labs  Lab 09/21/21 0514  GLUCAP 125*   Lipid Profile: No results for input(s): CHOL, HDL, LDLCALC, TRIG, CHOLHDL, LDLDIRECT in the last 72 hours. Thyroid Function Tests: No results for input(s): TSH, T4TOTAL, FREET4, T3FREE, THYROIDAB in the last 72 hours. Anemia Panel: No results for input(s): VITAMINB12, FOLATE, FERRITIN, TIBC, IRON, RETICCTPCT in the last 72 hours. Urine analysis:    Component Value Date/Time   COLORURINE STRAW (A) 09/21/2021 0620   APPEARANCEUR CLEAR 09/21/2021 0620   LABSPEC 1.015 09/21/2021 0620   PHURINE 7.0 09/21/2021 0620   GLUCOSEU NEGATIVE 09/21/2021 0620   HGBUR NEGATIVE 09/21/2021 0620   BILIRUBINUR NEGATIVE 09/21/2021 0620   KETONESUR NEGATIVE 09/21/2021 0620   PROTEINUR NEGATIVE 09/21/2021 0620   NITRITE NEGATIVE 09/21/2021 0620   LEUKOCYTESUR NEGATIVE 09/21/2021 0620   Sepsis Labs: Recent Results (from the past 240 hour(s))  Resp Panel by RT-PCR (Flu A&B, Covid) Nasopharyngeal Swab     Status: None   Collection Time: 09/21/21  5:13 AM   Specimen: Nasopharyngeal Swab; Nasopharyngeal(NP) swabs in vial transport medium  Result Value Ref  Range Status   SARS Coronavirus 2 by RT PCR NEGATIVE NEGATIVE Final    Comment: (NOTE) SARS-CoV-2 target nucleic acids are NOT DETECTED.  The SARS-CoV-2 RNA is generally detectable in upper respiratory specimens during the acute phase of infection. The lowest concentration of SARS-CoV-2 viral copies this assay can detect is 138 copies/mL. A negative result does not preclude SARS-Cov-2 infection and should not be used as the sole basis for treatment or other patient management decisions. A negative result may occur with  improper specimen collection/handling, submission of specimen other than nasopharyngeal swab, presence of viral mutation(s) within the areas targeted by this assay, and inadequate number of viral copies(<138 copies/mL). A negative result must be combined with clinical observations, patient history, and epidemiological information. The expected result is Negative.  Fact Sheet for Patients:  EntrepreneurPulse.com.au  Fact Sheet for Healthcare Providers:  IncredibleEmployment.be  This test is no t yet approved or cleared by the [place] FDA and  has been authorized for detection and/or diagnosis of SARS-CoV-2 by FDA under an Emergency Use Authorization (EUA). This EUA will remain  in effect (meaning this test can be used) for the duration of the COVID-19 declaration under Section 564(b)(1) of the Act, 21 U.S.C.section 360bbb-3(b)(1), unless the authorization is terminated  or revoked sooner.       Influenza A by PCR NEGATIVE NEGATIVE Final   Influenza B by PCR NEGATIVE NEGATIVE Final    Comment: (NOTE) The Xpert Xpress SARS-CoV-2/FLU/RSV plus assay is intended as an aid in the diagnosis of influenza from Nasopharyngeal swab specimens and should not be used as a sole basis for treatment. Nasal washings and aspirates are unacceptable for Xpert Xpress SARS-CoV-2/FLU/RSV testing.  Fact Sheet for  Patients: EntrepreneurPulse.com.au  Fact Sheet for Healthcare Providers: IncredibleEmployment.be  This test is not yet approved or cleared by the  [place] Architectural technologist and has been authorized for detection and/or diagnosis of SARS-CoV-2 by FDA under an Print production planner (EUA). This EUA will remain in effect (meaning this test can be used) for the duration of the COVID-19 declaration under Section 564(b)(1) of the Act, 21 U.S.C. section 360bbb-3(b)(1), unless the authorization is terminated or revoked.  Performed at [facility], [place] [address], [place], [place] [address]      Radiological Exams on Admission: DG Chest Port 1 View  Result Date: 09/21/2021 CLINICAL DATA:  81 year old male code stroke presentation. EXAM: PORTABLE CHEST 1 VIEW COMPARISON:  CTA neck 0536 hours today. Chest radiograph 06/25/2017 and earlier. FINDINGS: Portable AP upright view at 0609 hours. Chronic elevation of the left hemidiaphragm with subjacent bowel is stable. Chronic lower thoracic spinal stimulator device. Mediastinal contours remain within normal limits. Visualized tracheal air column is within normal limits. Chronic left lung base atelectasis. Elsewhere when allowing for portable technique the lungs are clear. IMPRESSION: No acute cardiopulmonary abnormality. Chronic elevation of the left hemidiaphragm. Electronically Signed   By: Genevie Ann M.D.   On: 09/21/2021 06:47   CT HEAD CODE STROKE WO CONTRAST  Result Date: 09/21/2021 CLINICAL DATA:  Code stroke. 81 year old male with left side weakness and abnormal speech. EXAM: CT HEAD WITHOUT CONTRAST TECHNIQUE: Contiguous axial images were obtained from the base of the skull through the vertex without intravenous contrast. RADIATION DOSE REDUCTION: This exam was performed according to the departmental dose-optimization program which includes automated exposure control, adjustment of the mA and/or kV according to  patient size and/or use of iterative reconstruction technique. COMPARISON:  Head CT 12/27/2020. FINDINGS: Brain: Stable cerebral volume. No midline shift, ventriculomegaly, mass effect, evidence of mass lesion, intracranial hemorrhage or evidence of cortically based acute infarction. Patchy and confluent bilateral periventricular white matter hypodensity appears stable. Vascular: Calcified atherosclerosis at the skull base. No suspicious intracranial vascular hyperdensity. Skull: No acute osseous abnormality identified. Sinuses/Orbits: Visualized paranasal sinuses and mastoids are clear. Other: No acute orbit or scalp soft tissue finding. ASPECTS [facility] Stroke Program Early CT Score) Total score (0-10 with 10 being normal): 10 IMPRESSION: 1. Stable non contrast CT appearance of the brain with chronic white matter disease. ASPECTS 10. 2. These results were communicated to Dr. Leonel Ramsay at 5:35 am on 09/21/2021 by text page via the [facility] messaging system. Electronically Signed   By: Genevie Ann M.D.   On: 09/21/2021 05:35   CT ANGIO HEAD NECK W WO CM (CODE STROKE)  Result Date: 09/21/2021 CLINICAL DATA:  81 year old male code stroke presentation. EXAM: CT ANGIOGRAPHY HEAD AND NECK TECHNIQUE: Multidetector CT imaging of the head and neck was performed using the standard protocol during bolus administration of intravenous contrast. Multiplanar CT image reconstructions and MIPs were obtained to evaluate the vascular anatomy. Carotid stenosis measurements (when applicable) are obtained utilizing NASCET criteria, using the distal internal carotid diameter as the denominator. RADIATION DOSE REDUCTION: This exam was performed according to the departmental dose-optimization program which includes automated exposure control, adjustment of the mA and/or kV according to patient size and/or use of iterative reconstruction technique. CONTRAST:  68mL OMNIPAQUE IOHEXOL 350 MG/ML SOLN COMPARISON:  Neck CT 08/30/2021. FINDINGS: CTA  NECK Skeleton: Advanced cervical spine degeneration, with intervening interbody ankylosis, facet ankylosis, and severe facet arthropathy. No acute osseous abnormality identified. Upper chest: Negative. Other neck: Partially calcified 14 mm right inferior thyroid pole nodule. Not clinically significant; no follow-up imaging recommended (ref: J Am Coll Radiol. 2015 Feb;12(2): 143-50).  Asymmetric nodular enhancement of the right submandibular gland in an area of about 17 mm, with a small superior pole are feeding vessel (series 9, image 82). This corresponds to the heterogeneous portion of the gland described in December. Other face soft tissues appear negative. Aortic arch: 3 vessel arch configuration with tortuous proximal great vessels. Mild calcified arch atherosclerosis. Right carotid system: Tortuous brachiocephalic artery and right CCA origin without plaque or stenosis. Mild calcified plaque at the right ICA origin and bulb without stenosis. Mildly tortuous right ICA. Left carotid system: Tortuous proximal left CCA without plaque or stenosis. Mild calcified plaque at the left ICA origin and bulb. No stenosis results. Vertebral arteries: Tortuous proximal right subclavian artery without stenosis. Mild calcified plaque at the right vertebral artery origin and mild origin stenosis. Non dominant right vertebral artery remains patent through the V2 segment. There is proximal V3 segment calcified plaque with additional mild stenosis on series 7, image 24. The vessel is diminutive but remains patent to the skull base. Tortuous proximal left subclavian artery without stenosis. Normal left vertebral artery origin. Dominant left vertebral artery. Mild V2 segment calcified plaque but no left vertebral stenosis to the skull base. CTA HEAD Posterior circulation: Dominant left vertebral V4 segment primarily supplies the basilar with normal left PICA origin. Diminutive right vertebral artery functionally terminates in PICA  although there is a small V4 continuation to the basilar. Patent basilar artery with mild irregularity but no stenosis. Patent SCA and PCA origins. Left PCA branches are within normal limits. There is moderate somewhat long segment irregularity and stenosis of the right P1 and P2 best seen on series 11, image 24. But distal right PCA branches do appear patent and appear relatively normal. Anterior circulation: Both ICA siphons are patent. Mild to moderate calcified siphon plaque bilaterally. No significant right siphon stenosis. On the left there is mild stenosis of the proximal cavernous ICA. Patent carotid termini. Patent MCA and ACA origins. Normal anterior communicating artery. Bilateral ACA branches are within normal limits. Left MCA M1 segment and bifurcation are patent without stenosis. No left MCA branch occlusion. There is mild irregularity of the posterior left M2 (series 12, image 35). Right MCA M1 segment is tortuous. Right MCA bifurcation is patent without stenosis. Right MCA branches are patent. Mild right M2 and M3 branch irregularity. Venous sinuses: Patent. Anatomic variants: Dominant left vertebral artery primarily supplies the basilar. The right is diminutive and functionally terminates in PICA. Review of the MIP images confirms the above findings IMPRESSION: 1. Negative for large vessel occlusion. 2. 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 Mild right ICA siphon stenosis. Other bilateral carotid plaque with no significant stenosis. 3. 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 Salient findings were communicated to Dr. Leonel Ramsay at 6:07 am on 09/21/2021 by text page via the [facility] messaging system. Electronically Signed   By: Genevie Ann M.D.   On: 09/21/2021 06:07    EKG: Independently reviewed.  Sinus rhythm at 70 bpm  Assessment/Plan Acute metabolic encephalopathy and weakness:  Patient presents after being noted to have bilateral lower extremity weakness and being confused.  CT/CT angio of the head and neck negative for any acute cause of patient's symptoms.  Patient was noted to have blood pressures elevated up to 199/93.  Question possibility of TIA/stroke vs. hypertensive encephalopathy vs. seizure as a cause of symptoms.  Neurology evaluated patient and initially recommended MRI.  However at  that time it was not known that the patient has a spinal stimulator which MRI is not able to be performed -Admit to medical telemetry bed -Neurochecks -Check TSH -PT to evaluate and treat -Recheck CT scan of the brain in a.m. -Transitions of care  -Consider discussing with neurology in a.m. regarding other possibilities of work-up  History of pulmonary embolus on chronic anticoagulation  subtherapeutic INR: Patient with prior history of pulmonary emboli.  INR was subtherapeutic at 1.6. -Lovenox per pharmacy for bridging -Coumadin per pharmacy  Hypertensive urgency: Acute.  Blood pressures initially elevated up to 199/93.  Question if symptoms are possibly related to hypertensive encephalopathy. -Allow for permissive hypertension as initial concern was for possibility of stroke. -Resume pharmacy substitution of fosinopril  Elevated troponin(acute)   coronary artery disease: The patient denies any complaints of chest pain at this time.  High-sensitivity troponins 18->21. -Follow-up echocardiogram  Diastolic congestive heart failure  b/l lower extremity edema: Acute on chronic.  BNP noted to be 110.9.  Patient with at least 2+ pitting bilateral lower extremity edema on physical exam.  Chest x-ray showed no acute abnormalities. -Strict intake and output and daily weight -Follow-up echocardiogram -Lasix 40 mg IV twice daily x2 does -Reassess in a.m. and  determine if patient benefited   Diabetes mellitus type 2: Patient appears to be relatively diet controlled and not on any  medications for treatment.  Dyslipidemia: Home medications include fenofibrate 160 mg nightly. -Continue fenofibrate  BPH -Continue finasteride and tamsulosin -Check post void residuals  DVT prophylaxis: Coumadin Code Status: Full Family Communication: Wife updated over the phone Disposition Plan: To be determined Consults called: Neurology Admission status: Observation  Norval Morton MD [facility]   If 7PM-7AM, please contact night-coverage   09/21/2021, 10:45 AM

## 2021-09-21 NOTE — ED Provider Notes (Signed)
°  Physical Exam  BP (!) 158/92    Pulse 80    Temp (!) 97.5 F (36.4 C) (Oral)    Resp 17    SpO2 97%   Physical Exam  Procedures  Procedures  ED Course / MDM    Medical Decision Making Amount and/or Complexity of Data Reviewed Labs: ordered. Radiology: ordered.   Patient received in signout.  Had come in as a code stroke.  Reported generalized weakness and questionable lateralization.  Seen by neurology.  Not considered tPA candidate.  Thought to do more of a generalized weakness.  Head CT and CTA reassuring.  Lab work reassuring but troponin just barely above normal.  EKG interpreted and reassuring.  Will admit to internal medicine  Past Medical History:  Diagnosis Date   Benign prostatic hyperplasia (BPH) with urinary urgency    Bile duct stone    Cervical disc disease    CHF (congestive heart failure) (HCC)    Chronic kidney disease (CKD), stage III (moderate) ([place])    pt unaware   Coronary artery disease    pt unaware   Diabetes mellitus without complication (HCC)    Diabetes, polyneuropathy (HCC)    Diastolic dysfunction    ED (erectile dysfunction)    Edema    Elevated hemidiaphragm    Elevated liver function tests    Gait abnormality    Gallstones    GERD (gastroesophageal reflux disease)    Gout    History of anticoagulant therapy    History of rib fracture    Hyperlipidemia    Hypertension    Lung mass    Major depression    Malaise and fatigue    Mouth pain    OA (osteoarthritis)    Pneumonia    history of   Prostate disease    Pulmonary emboli (HCC)    Sleep apnea    does not use CPAP, resolved   Stool incontinence    Testosterone deficiency    Vitamin B 12 deficiency    Weight loss           Davonna Belling, MD 09/21/21 [phone]

## 2021-09-21 NOTE — Progress Notes (Addendum)
[place] for warfarin, add on Lovenox Indication: pulmonary embolus history  Height: Weight: 219.6 lb (99.8 kg) per RN  Assessment: 72 yom with hx of PE on warfarin PTA presenting with confusion and slurred speech. Pharmacy consulted to dose warfarin inpatient. INR subtherapeutic at 1.6 on presentation (missed dose 1/19), CBC wnl. No active bleed issues reported.  PTA warfarin dose: 5mg  daily (last dose 1/18 PTA)  PM: Consulted to bridge with Lovenox for subtherapeutic INR. Unable to do MRI due to spinal stimulator. Renal function is normal. No bleeding noted, CBC is normal.  Goal of Therapy:  INR 2-3 Anti-Xa level 0.6-1 units/ml 4hrs after LMWH dose given Monitor platelets by anticoagulation protocol: Yes   Plan:  Lovenox 100 mg SQ q12h - d/c when INR >=2 Warfarin as per previous order Daily INR Monitor CBC q72h, s/sx bleeding  Thank you for involving pharmacy in this patient's care.  Renold Genta, PharmD, BCPS Clinical Pharmacist Clinical phone for 09/21/2021 until 10p is [phone] 09/21/2021 6:51 PM  **Pharmacist phone directory can be found on [email or web address] listed under [place]**

## 2021-09-21 NOTE — ED Provider Notes (Signed)
[name] Provider Note   CSN: [id number] Arrival date & time: 09/21/21  3662  An emergency department physician performed an initial assessment on this suspected stroke patient at 0513.  History  Chief Complaint  Patient presents with   Code Stroke    [name] is a 81 y.o. male.  Patient presents to the emergency department for evaluation as a code stroke.  He is brought to the emergency department from home.  Patient has had generalized weakness and inability to ambulate since 4 PM yesterday.  Wife reports that he is experiencing slurred speech.  EMS report that he had evidence of left-sided weakness to their exam.      Home Medications Prior to Admission medications   Medication Sig Start Date End Date Taking? Authorizing Provider  DULoxetine (CYMBALTA) 30 MG capsule Take 30 mg by mouth every evening. 06/19/21  Yes [provider]  ergocalciferol (VITAMIN D2) 1.25 MG (50000 UT) capsule Take 50,000 Units by mouth every Monday. 06/19/20  Yes [provider]  fenofibrate 160 MG tablet Take 160 mg by mouth at bedtime. 12/17/18  Yes [provider]  finasteride (PROSCAR) 5 MG tablet Take 5 mg by mouth daily with lunch. 09/22/18  Yes [provider]  fosinopril (MONOPRIL) 10 MG tablet Take 10 mg by mouth daily. 09/28/18  Yes [provider]  omeprazole (PRILOSEC) 10 MG capsule Take 10 mg by mouth daily. 07/31/18  Yes [provider]  oxyCODONE-acetaminophen (PERCOCET) 10-325 MG tablet Take 1 tablet by mouth every 8 (eight) hours as needed for pain.   Yes [provider]  Probiotic Product (ALIGN PO) Take 1 capsule by mouth daily.   Yes [provider]  tamsulosin (FLOMAX) 0.4 MG CAPS capsule Take 0.4 mg by mouth daily with lunch. 07/23/21  Yes [provider]  warfarin (COUMADIN) 5 MG tablet Take 1 tablet (5 mg total) by mouth daily. Patient taking  differently: Take 5 mg by mouth every evening. 01/04/19  Yes Mikhail, [place], DO      Allergies    Codeine, Statins, Allopurinol, and Doxycycline    Review of Systems   Review of Systems  Neurological:  Positive for weakness.   Physical Exam Updated Vital Signs BP (!) 163/106    Pulse (!) 53    Temp (!) 97.5 F (36.4 C) (Oral)    Resp 18    SpO2 95%  Physical Exam Vitals and nursing note reviewed.  Constitutional:      General: He is not in acute distress.    Appearance: Normal appearance. He is well-developed.  HENT:     Head: Normocephalic and atraumatic.     Right Ear: Hearing normal.     Left Ear: Hearing normal.     Nose: Nose normal.  Eyes:     Conjunctiva/sclera: Conjunctivae normal.     Pupils: Pupils are equal, round, and reactive to light.  Cardiovascular:     Rate and Rhythm: Regular rhythm.     Heart sounds: S1 normal and S2 normal. No murmur heard.   No friction rub. No gallop.  Pulmonary:     Effort: Pulmonary effort is normal. No respiratory distress.     Breath sounds: Normal breath sounds.  Chest:     Chest wall: No tenderness.  Abdominal:     General: Bowel sounds are normal.     Palpations: Abdomen is soft.     Tenderness: There is no abdominal tenderness. There is no  guarding or rebound. Negative signs include Murphy's sign and McBurney's sign.     Hernia: No hernia is present.  Musculoskeletal:        General: Normal range of motion.     Cervical back: Normal range of motion and neck supple.     Right lower leg: Edema present.     Left lower leg: Edema present.  Skin:    General: Skin is warm and dry.     Findings: No rash.  Neurological:     Mental Status: He is alert and oriented to person, place, and time.     GCS: GCS eye subscore is 4. GCS verbal subscore is 5. GCS motor subscore is 6.     Cranial Nerves: No cranial nerve deficit.     Sensory: No sensory deficit.     Motor: Weakness (Bilateral lower extremity) present.     Coordination:  Coordination normal.     Comments: No upper extremity pronator drift  Psychiatric:        Speech: Speech normal.        Behavior: Behavior normal.        Thought Content: Thought content normal.    ED Results / Procedures / Treatments   Labs (all labs ordered are listed, but only abnormal results are displayed) Labs Reviewed  PROTIME-INR - Abnormal; Notable for the following components:      Result Value   Prothrombin Time 18.9 (*)    INR 1.6 (*)    All other components within normal limits  DIFFERENTIAL - Abnormal; Notable for the following components:   Monocytes Absolute 1.7 (*)    All other components within normal limits  COMPREHENSIVE METABOLIC PANEL - Abnormal; Notable for the following components:   Glucose, Bld 128 (*)    All other components within normal limits  URINALYSIS, ROUTINE W REFLEX MICROSCOPIC - Abnormal; Notable for the following components:   Color, Urine STRAW (*)    All other components within normal limits  I-STAT CHEM 8, ED - Abnormal; Notable for the following components:   Glucose, Bld 127 (*)    All other components within normal limits  CBG MONITORING, ED - Abnormal; Notable for the following components:   Glucose-Capillary 125 (*)    All other components within normal limits  RESP PANEL BY RT-PCR (FLU A&B, COVID) ARPGX2  URINE CULTURE  ETHANOL  APTT  CBC  RAPID URINE DRUG SCREEN, HOSP PERFORMED  LACTIC ACID, PLASMA  BRAIN NATRIURETIC PEPTIDE  TROPONIN I (HIGH SENSITIVITY)  TROPONIN I (HIGH SENSITIVITY)    EKG None  Radiology DG Chest Port 1 View  Result Date: 09/21/2021 CLINICAL DATA:  81 year old male code stroke presentation. EXAM: PORTABLE CHEST 1 VIEW COMPARISON:  CTA neck 0536 hours today. Chest radiograph 06/25/2017 and earlier. FINDINGS: Portable AP upright view at 0609 hours. Chronic elevation of the left hemidiaphragm with subjacent bowel is stable. Chronic lower thoracic spinal stimulator device. Mediastinal contours remain  within normal limits. Visualized tracheal air column is within normal limits. Chronic left lung base atelectasis. Elsewhere when allowing for portable technique the lungs are clear. IMPRESSION: No acute cardiopulmonary abnormality. Chronic elevation of the left hemidiaphragm. Electronically Signed   By: Odessa Fleming M.D.   On: 09/21/2021 06:47   CT HEAD CODE STROKE WO CONTRAST  Result Date: 09/21/2021 CLINICAL DATA:  Code stroke. 81 year old male with left side weakness and abnormal speech. EXAM: CT HEAD WITHOUT CONTRAST TECHNIQUE: Contiguous axial images were obtained from the base of the skull  through the vertex without intravenous contrast. RADIATION DOSE REDUCTION: This exam was performed according to the departmental dose-optimization program which includes automated exposure control, adjustment of the mA and/or kV according to patient size and/or use of iterative reconstruction technique. COMPARISON:  Head CT 12/27/2020. FINDINGS: Brain: Stable cerebral volume. No midline shift, ventriculomegaly, mass effect, evidence of mass lesion, intracranial hemorrhage or evidence of cortically based acute infarction. Patchy and confluent bilateral periventricular white matter hypodensity appears stable. Vascular: Calcified atherosclerosis at the skull base. No suspicious intracranial vascular hyperdensity. Skull: No acute osseous abnormality identified. Sinuses/Orbits: Visualized paranasal sinuses and mastoids are clear. Other: No acute orbit or scalp soft tissue finding. ASPECTS [facility]([place] Stroke Program Early CT Score) Total score (0-10 with 10 being normal): 10 IMPRESSION: 1. Stable non contrast CT appearance of the brain with chronic white matter disease. ASPECTS 10. 2. These results were communicated to Dr. Amada JupiterKirkpatrick at 5:35 am on 09/21/2021 by text page via the [facility] messaging system. Electronically Signed   By: Odessa FlemingH  Hall M.D.   On: 09/21/2021 05:35   CT ANGIO HEAD NECK W WO CM (CODE STROKE)  Result Date:  09/21/2021 CLINICAL DATA:  81 year old male code stroke presentation. EXAM: CT ANGIOGRAPHY HEAD AND NECK TECHNIQUE: Multidetector CT imaging of the head and neck was performed using the standard protocol during bolus administration of intravenous contrast. Multiplanar CT image reconstructions and MIPs were obtained to evaluate the vascular anatomy. Carotid stenosis measurements (when applicable) are obtained utilizing NASCET criteria, using the distal internal carotid diameter as the denominator. RADIATION DOSE REDUCTION: This exam was performed according to the departmental dose-optimization program which includes automated exposure control, adjustment of the mA and/or kV according to patient size and/or use of iterative reconstruction technique. CONTRAST:  75mL OMNIPAQUE IOHEXOL 350 MG/ML SOLN COMPARISON:  Neck CT 08/30/2021. FINDINGS: CTA NECK Skeleton: Advanced cervical spine degeneration, with intervening interbody ankylosis, facet ankylosis, and severe facet arthropathy. No acute osseous abnormality identified. Upper chest: Negative. Other neck: Partially calcified 14 mm right inferior thyroid pole nodule. Not clinically significant; no follow-up imaging recommended (ref: J Am Coll Radiol. 2015 Feb;12(2): 143-50). Asymmetric nodular enhancement of the right submandibular gland in an area of about 17 mm, with a small superior pole are feeding vessel (series 9, image 82). This corresponds to the heterogeneous portion of the gland described in December. Other face soft tissues appear negative. Aortic arch: 3 vessel arch configuration with tortuous proximal great vessels. Mild calcified arch atherosclerosis. Right carotid system: Tortuous brachiocephalic artery and right CCA origin without plaque or stenosis. Mild calcified plaque at the right ICA origin and bulb without stenosis. Mildly tortuous right ICA. Left carotid system: Tortuous proximal left CCA without plaque or stenosis. Mild calcified plaque at the  left ICA origin and bulb. No stenosis results. Vertebral arteries: Tortuous proximal right subclavian artery without stenosis. Mild calcified plaque at the right vertebral artery origin and mild origin stenosis. Non dominant right vertebral artery remains patent through the V2 segment. There is proximal V3 segment calcified plaque with additional mild stenosis on series 7, image 24. The vessel is diminutive but remains patent to the skull base. Tortuous proximal left subclavian artery without stenosis. Normal left vertebral artery origin. Dominant left vertebral artery. Mild V2 segment calcified plaque but no left vertebral stenosis to the skull base. CTA HEAD Posterior circulation: Dominant left vertebral V4 segment primarily supplies the basilar with normal left PICA origin. Diminutive right vertebral artery functionally terminates in PICA although there is a small V4  continuation to the basilar. Patent basilar artery with mild irregularity but no stenosis. Patent SCA and PCA origins. Left PCA branches are within normal limits. There is moderate somewhat long segment irregularity and stenosis of the right P1 and P2 best seen on series 11, image 24. But distal right PCA branches do appear patent and appear relatively normal. Anterior circulation: Both ICA siphons are patent. Mild to moderate calcified siphon plaque bilaterally. No significant right siphon stenosis. On the left there is mild stenosis of the proximal cavernous ICA. Patent carotid termini. Patent MCA and ACA origins. Normal anterior communicating artery. Bilateral ACA branches are within normal limits. Left MCA M1 segment and bifurcation are patent without stenosis. No left MCA branch occlusion. There is mild irregularity of the posterior left M2 (series 12, image 35). Right MCA M1 segment is tortuous. Right MCA bifurcation is patent without stenosis. Right MCA branches are patent. Mild right M2 and M3 branch irregularity. Venous sinuses: Patent.  Anatomic variants: Dominant left vertebral artery primarily supplies the basilar. The right is diminutive and functionally terminates in PICA. Review of the MIP images confirms the above findings IMPRESSION: 1. Negative for large vessel occlusion. 2. 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 Mild right ICA siphon stenosis. Other bilateral carotid plaque with no significant stenosis. 3. 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 Salient findings were communicated to Dr. Amada JupiterKirkpatrick at 6:07 am on 09/21/2021 by text page via the [facility] messaging system. Electronically Signed   By: Odessa FlemingH  Hall M.D.   On: 09/21/2021 06:07    Procedures Procedures    Medications Ordered in ED Medications  iohexol (OMNIPAQUE) 350 MG/ML injection 75 mL (75 mLs Intravenous Contrast Given 09/21/21 0542)    ED Course/ Medical Decision Making/ A&P                           Medical Decision Making Amount and/or Complexity of Data Reviewed Labs: ordered. Radiology: ordered.   Patient presented to the emergency department as a code stroke.  EMS perceived left-sided weakness and slurred speech prior to arrival.  At arrival, patient appears slightly delirious, confused with generalized weakness.  No focal abnormalities noted.  Neurology has evaluated the patient and recommended MRI but it is felt that this is encephalopathy secondary to medical condition.  Will perform medical work-up.  Patient will likely require hospitalization.  Will sign to oncoming ER physician to follow-up on results.        Final Clinical Impression(s) / ED Diagnoses Final diagnoses:  Encephalopathy    Rx / DC Orders ED Discharge Orders     None         Devanny Palecek, Canary Brimhristopher J, MD 09/21/21 [phone]

## 2021-09-21 NOTE — ED Notes (Signed)
Patient denies pain and is resting comfortably.  

## 2021-09-21 NOTE — Code Documentation (Signed)
Stroke Response Nurse Documentation Code Documentation  [name] is a 81 y.o. male arriving to [facility] ED via [facility] on 1/20 with past medical hx of CHF, HTN, DM, CKD. On warfarin daily. Code stroke was activated by EMS.   Patient from Home where he was LKW at 1600 1/19 and now complaining of slurred speech and left sided weakness.   Stroke team at the bedside on patient arrival. Labs drawn and patient cleared for CT by Dr. Oletta Cohn. Patient to CT with team. NIHSS 8, see documentation for details and code stroke times. Patient with bilateral leg weakness, dysarthria , and Sensory  neglect on exam. The following imaging was completed:  CT, CTA head and neck. Patient is not a candidate for IV Thrombolytic due to anticoagulation. Patient is not a candidate for IR due to No LVO.    Bedside handoff with ED RN Lanora Manis.    Rose Fillers  Rapid Response RN

## 2021-09-21 NOTE — Progress Notes (Signed)
ANTICOAGULATION CONSULT NOTE  Pharmacy Consult for warfarin Indication: pulmonary embolus history   Assessment: 58 yom with hx of PE on warfarin PTA presenting with confusion and slurred speech. Pharmacy consulted to dose warfarin inpatient. INR subtherapeutic at 1.6 on presentation (missed dose 1/19), CBC wnl. No active bleed issues reported.  PTA warfarin dose: 5mg  daily (last dose 1/18 PTA)  Goal of Therapy:  INR 2-3 Monitor platelets by anticoagulation protocol: Yes   Plan:  Warfarin 7.5mg  PO x 1 dose at 1600 Daily INR Monitor CBC, s/sx bleeding  2/18, PharmD, BCPS Clinical Pharmacist 09/21/2021 12:47 PM

## 2021-09-22 ENCOUNTER — Observation Stay (HOSPITAL_COMMUNITY): Payer: Medicare HMO

## 2021-09-22 ENCOUNTER — Observation Stay (HOSPITAL_BASED_OUTPATIENT_CLINIC_OR_DEPARTMENT_OTHER): Payer: Medicare HMO

## 2021-09-22 DIAGNOSIS — I5033 Acute on chronic diastolic (congestive) heart failure: Secondary | ICD-10-CM

## 2021-09-22 DIAGNOSIS — R29898 Other symptoms and signs involving the musculoskeletal system: Secondary | ICD-10-CM | POA: Diagnosis not present

## 2021-09-22 DIAGNOSIS — G9340 Encephalopathy, unspecified: Secondary | ICD-10-CM | POA: Diagnosis not present

## 2021-09-22 DIAGNOSIS — I160 Hypertensive urgency: Secondary | ICD-10-CM | POA: Diagnosis not present

## 2021-09-22 DIAGNOSIS — Z86711 Personal history of pulmonary embolism: Secondary | ICD-10-CM | POA: Diagnosis not present

## 2021-09-22 DIAGNOSIS — I5032 Chronic diastolic (congestive) heart failure: Secondary | ICD-10-CM | POA: Diagnosis not present

## 2021-09-22 LAB — ECHOCARDIOGRAM COMPLETE
AR max vel: 3.64 cm2
AV Area VTI: 3.97 cm2
AV Area mean vel: 3.61 cm2
AV Mean grad: 7 mmHg
AV Peak grad: 12.3 mmHg
Ao pk vel: 1.75 m/s
Area-P 1/2: 2.91 cm2
Calc EF: 60.1 %
S' Lateral: 3.2 cm
Single Plane A2C EF: 62.2 %
Single Plane A4C EF: 54.7 %

## 2021-09-22 LAB — BASIC METABOLIC PANEL
Anion gap: 7 (ref 5–15)
BUN: 15 mg/dL (ref 8–23)
CO2: 24 mmol/L (ref 22–32)
Calcium: 9 mg/dL (ref 8.9–10.3)
Chloride: 104 mmol/L (ref 98–111)
Creatinine, Ser: 1.03 mg/dL (ref 0.61–1.24)
GFR, Estimated: >60 mL/min (ref >60)
Glucose, Bld: 132 mg/dL — ABNORMAL HIGH (ref 70–99)
Potassium: 3.1 mmol/L — ABNORMAL LOW (ref 3.5–5.1)
Sodium: 135 mmol/L (ref 135–145)

## 2021-09-22 LAB — CBC
HCT: 40.7 % (ref 39.0–52.0)
Hemoglobin: 13.2 g/dL (ref 13.0–17.0)
MCH: 28.5 pg (ref 26.0–34.0)
MCHC: 32.4 g/dL (ref 30.0–36.0)
MCV: 87.9 fL (ref 80.0–100.0)
Platelets: 193 10*3/uL (ref 150–400)
RBC: 4.63 MIL/uL (ref 4.22–5.81)
RDW: 14.6 % (ref 11.5–15.5)
WBC: 9.3 10*3/uL (ref 4.0–10.5)
nRBC: 0 % (ref 0.0–0.2)

## 2021-09-22 LAB — CK: Total CK: 153 U/L (ref 49–397)

## 2021-09-22 LAB — FOLATE: Folate: 13 ng/mL (ref >5.9)

## 2021-09-22 LAB — VITAMIN B12: Vitamin B-12: 304 pg/mL (ref 180–914)

## 2021-09-22 LAB — URINE CULTURE: Culture: <10000 — AB

## 2021-09-22 LAB — PROTIME-INR
INR: 1.4 — ABNORMAL HIGH (ref 0.8–1.2)
Prothrombin Time: 17.4 seconds — ABNORMAL HIGH (ref 11.4–15.2)

## 2021-09-22 LAB — HIV ANTIBODY (ROUTINE TESTING W REFLEX): HIV Screen 4th Generation wRfx: NONREACTIVE

## 2021-09-22 MED ORDER — WARFARIN SODIUM 5 MG PO TABS
5.0000 mg | ORAL_TABLET | Freq: Once | ORAL | Status: AC
Start: 2021-09-22 — End: 2021-09-22
  Administered 2021-09-22: 5 mg via ORAL
  Filled 2021-09-22: qty 1

## 2021-09-22 MED ORDER — POTASSIUM CHLORIDE 20 MEQ PO PACK: 40.0000 meq | PACK | Freq: Two times a day (BID) | ORAL | Status: DC

## 2021-09-22 MED ORDER — POTASSIUM CHLORIDE CRYS ER 20 MEQ PO TBCR
60.0000 meq | EXTENDED_RELEASE_TABLET | Freq: Once | ORAL | Status: AC
  Administered 2021-09-22: 60 meq via ORAL
  Filled 2021-09-22: qty 3

## 2021-09-22 MED ORDER — POTASSIUM CHLORIDE 20 MEQ PO PACK
40.0000 meq | PACK | Freq: Once | ORAL | Status: AC
  Administered 2021-09-22: 40 meq via ORAL
  Filled 2021-09-22: qty 2

## 2021-09-22 NOTE — Assessment & Plan Note (Addendum)
Patient has ambulatory dysfunction at baseline but is able to ambulate with her cane.  He has noticed some increasing weakness in his legs over the past few days.  Patient reports a history of neuropathy previously. Patient underwent CT scan of the cervical thoracic and lumbar spine.  Patient is noted to have evidence for stenoses but not severe enough to cause his symptoms.  MRI could not be done due to presence of spinal stimulator. Neurology continues to follow.  Metabolic work-up has been initiated. CK, folic acid, B12 levels satisfactory.  HIV nonreactive.  TSH was normal previously. Seen by PT and OT.  Skilled nursing facility recommended for rehabilitation. Patient's lower extremity weakness continues to improve.  Imaging studies including CT of his cervical thoracic and lumbar spine was discussed by neurology with neurosurgery.  Since patient's symptoms are improving there is no indication for intervention at this time.  Outpatient follow-up with neurosurgery as recommended.  Pain control.

## 2021-09-22 NOTE — Evaluation (Signed)
Occupational Therapy Evaluation Patient Details Name: Brandon [name] MRN: [id number] DOB: May 12, 1941 Today's Date: 09/22/2021   History of Present Illness 81 y/o male presented to ED on 09/21/21 for confusion, weakness, and slurred speech. CT head negative. CT spine showed impingement at L3-S1, C5-6, and T8-9. PMH: HTN, diastolic CHF, hx of bilateral PE, CAD, DM   Clinical Impression   Pt admitted for concerns listed above. PTA pt reported that he was independent with all ADL's and functional mobility. At this time, pt reporting that he can't walk and hasn't in the past 3 days. With max encouragement, pt able to complete bed mobility with min A and functional mobility with min A +2. He presents with balance deficits and weakness, he requires min - max A for all ADL's. Recommending SNF to maximize pt's independence and safety. OT will continue to follow acutely.      Recommendations for follow up therapy are one component of a multi-disciplinary discharge planning process, led by the attending physician.  Recommendations may be updated based on patient status, additional functional criteria and insurance authorization.   Follow Up Recommendations  Skilled nursing-short term rehab (<3 hours/day)    Assistance Recommended at Discharge Frequent or constant Supervision/Assistance  Patient can return home with the following A little help with walking and/or transfers;A lot of help with bathing/dressing/bathroom;Direct supervision/assist for medications management    Functional Status Assessment  Patient has had a recent decline in their functional status and demonstrates the ability to make significant improvements in function in a reasonable and predictable amount of time.  Equipment Recommendations  Other (comment) (RW)    Recommendations for Other Services       Precautions / Restrictions Precautions Precautions: Fall Restrictions Weight Bearing Restrictions: No      Mobility Bed  Mobility Overal bed mobility: Needs Assistance Bed Mobility: Supine to Sit, Sit to Supine     Supine to sit: Min assist Sit to supine: Min assist   General bed mobility comments: minA to come into sitting with posterior lean initially. MinA to return to supine with LE management    Transfers Overall transfer level: Needs assistance Equipment used: Rolling walker (2 wheels) Transfers: Sit to/from Stand Sit to Stand: From elevated surface, Min assist, +2 safety/equipment           General transfer comment: minA+2 to stand from highly elevated surface. Cues for anterior lean prior to standing due to patient's posterior lean      Balance Overall balance assessment: Needs assistance Sitting-balance support: Bilateral upper extremity supported, Feet supported Sitting balance-Leahy Scale: Poor Sitting balance - Comments: minA to maintain sitting balance due to posterior lean Postural control: Posterior lean Standing balance support: Bilateral upper extremity supported, During functional activity, Reliant on assistive device for balance Standing balance-Leahy Scale: Poor                             ADL either performed or assessed with clinical judgement   ADL Overall ADL's : Needs assistance/impaired Eating/Feeding: Set up;Sitting   Grooming: Set up;Sitting   Upper Body Bathing: Min guard;Sitting   Lower Body Bathing: Moderate assistance;Sitting/lateral leans;Sit to/from stand   Upper Body Dressing : Min guard;Sitting   Lower Body Dressing: Maximal assistance;Sitting/lateral leans;Sit to/from stand   Toilet Transfer: Minimal assistance;+2 for physical assistance;With caregiver independent assisting;Ambulation   Toileting- Clothing Manipulation and Hygiene: Moderate assistance;Sit to/from stand;Sitting/lateral lean       Functional mobility during  ADLs: Minimal assistance;+2 for physical assistance;+2 for safety/equipment;Rolling walker (2 wheels) General  ADL Comments: Increased assist for ADL's, as pt has decreased balance and strength.     Vision Baseline Vision/History: 1 Wears glasses Ability to See in Adequate Light: 0 Adequate Patient Visual Report: No change from baseline Vision Assessment?: No apparent visual deficits     Perception     Praxis      Pertinent Vitals/Pain Pain Assessment Pain Assessment: Faces Faces Pain Scale: Hurts little more Pain Location: bilateral feet/legs Pain Descriptors / Indicators: Pins and needles, Sharp Pain Intervention(s): Monitored during session, Repositioned     Hand Dominance     Extremity/Trunk Assessment Upper Extremity Assessment Upper Extremity Assessment: Overall WFL for tasks assessed   Lower Extremity Assessment Lower Extremity Assessment: Generalized weakness;RLE deficits/detail;LLE deficits/detail RLE Deficits / Details: grossly 4-/5 RLE Sensation: decreased proprioception;history of peripheral neuropathy RLE Coordination: decreased fine motor;decreased gross motor LLE Deficits / Details: grossly 4-/5 LLE Sensation: decreased proprioception;history of peripheral neuropathy LLE Coordination: decreased fine motor;decreased gross motor   Cervical / Trunk Assessment Cervical / Trunk Assessment: Kyphotic   Communication Communication Communication: No difficulties   Cognition Arousal/Alertness: Awake/alert Behavior During Therapy: WFL for tasks assessed/performed Overall Cognitive Status: Within Functional Limits for tasks assessed                                 General Comments: seems WFL and wife present and didn't express concerns about cognition     General Comments  VSS on RA    Exercises     Shoulder Instructions      Home Living Family/patient expects to be discharged to:: Skilled nursing facility                                        Prior Functioning/Environment Prior Level of Function : Independent/Modified  Independent             Mobility Comments: was using cane prior to fall on 1/19. Since then has had difficulty ambulating          OT Problem List: Decreased strength;Decreased range of motion;Decreased activity tolerance;Impaired balance (sitting and/or standing);Decreased cognition;Decreased knowledge of use of DME or AE;Pain      OT Treatment/Interventions: Self-care/ADL training;Therapeutic exercise;Energy conservation;DME and/or AE instruction;Therapeutic activities;Balance training;Patient/family education    OT Goals(Current goals can be found in the care plan section) Acute Rehab OT Goals Patient Stated Goal: To go home OT Goal Formulation: With patient Time For Goal Achievement: 10/06/21 Potential to Achieve Goals: Good ADL Goals Pt Will Perform Grooming: with modified independence;standing Pt Will Perform Lower Body Bathing: with modified independence;sitting/lateral leans;sit to/from stand Pt Will Perform Lower Body Dressing: with modified independence;sitting/lateral leans;sit to/from stand Pt Will Transfer to Toilet: with modified independence;ambulating Pt Will Perform Toileting - Clothing Manipulation and hygiene: with modified independence;sitting/lateral leans;sit to/from stand  OT Frequency: Min 2X/week    Co-evaluation              AM-PAC OT "6 Clicks" Daily Activity     Outcome Measure Help from another person eating meals?: A Little Help from another person taking care of personal grooming?: A Little Help from another person toileting, which includes using toliet, bedpan, or urinal?: A Lot Help from another person bathing (including washing, rinsing, drying)?: A Lot Help from another person  to put on and taking off regular upper body clothing?: A Little Help from another person to put on and taking off regular lower body clothing?: A Lot 6 Click Score: 15   End of Session Equipment Utilized During Treatment: Gait belt;Rolling walker (2  wheels) Nurse Communication: Mobility status  Activity Tolerance: Patient tolerated treatment well Patient left: in bed;with call bell/phone within reach;with bed alarm set;with family/visitor present  OT Visit Diagnosis: Unsteadiness on feet (R26.81);Other abnormalities of gait and mobility (R26.89);Muscle weakness (generalized) (M62.81)                Time: 7371-0626 OT Time Calculation (min): 36 min Charges:  OT General Charges $OT Visit: 1 Visit OT Evaluation $OT Eval Moderate Complexity: 1 Mod  Dathan Attia H., OTR/L Acute Rehabilitation  Zuhayr Deeney Elane Bing Plume 09/22/2021, 7:06 PM

## 2021-09-22 NOTE — Assessment & Plan Note (Addendum)
On chronic anticoagulation with warfarin.  Continue Lovenox for bridging purposes till INR is between 2 and 3 for 2 consecutive days.

## 2021-09-22 NOTE — Assessment & Plan Note (Addendum)
Not on any medications prior to admission.  HbA1c 6.6.  Recommend checking glucose level every so often at skilled nursing facility.

## 2021-09-22 NOTE — Progress Notes (Signed)
ANTICOAGULATION CONSULT NOTE  Pharmacy Consult for warfarin and Lovenox Indication: pulmonary embolus history  Height: Weight: 219.6 lb (99.8 kg) per RN  Assessment: 80 yom with hx of PE on warfarin PTA presenting with confusion and slurred speech. Pharmacy consulted to bridge with Lovenox for subtherapeutic INR at 1.6 on presentation (missed dose 1/19), CBC wnl. No active bleed issues reported.  INR this am is 1.4, CBC stable, no signs or symptoms of bleeding  PTA warfarin dose: 5mg  daily (last dose 1/18 PTA)  Goal of Therapy:  INR 2-3 Anti-Xa level 0.6-1 units/ml 4hrs after LMWH dose given Monitor platelets by anticoagulation protocol: Yes   Plan:  Lovenox 100 mg SQ q12h - d/c when INR >=2 Coumadin 5 mg PO x1 dose at 1600 Daily INR Monitor CBC q72h, s/sx bleeding  Thank you for involving pharmacy in this patient's care.  2/18, PharmD PGY2 Infectious Diseases Pharmacy Resident   Please check [email or web address] for unit-specific pharmacy phone numbers

## 2021-09-22 NOTE — Progress Notes (Signed)
° °  Echocardiogram 2D Echocardiogram has been performed.  Festus Barren 09/22/2021, 9:46 AM

## 2021-09-22 NOTE — Hospital Course (Addendum)
81 y.o. 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  At baseline patient ambulates with the use of a cane and has some memory issues after being on life support for a period in time after having bilateral pulmonary emboli.  Apparently patient had a fall in his yard.  Neighbors helped him back into the house.  Has had urinary frequency and some incontinence as well.  Blood pressure was elevated.  Patient was brought into the emergency department as a code stroke.  Not a tPA candidate as he was out of the window.  Seen by neurology.  Was hospitalized for further evaluation and management.  MRI was ordered but cannot be done because he has spinal stimulator which was not known previously.  Subsequently underwent CT scan of the cervical thoracic and lumbar spine.  Seen by physical and Occupational Therapy.  Skilled nursing facility is recommended for rehab.

## 2021-09-22 NOTE — Evaluation (Signed)
Physical Therapy Evaluation Patient Details Name: Brandon [name] MRN: [id number] DOB: 1941/02/24 Today's Date: 09/22/2021  History of Present Illness  81 y/o male presented to ED on 09/21/21 for confusion, weakness, and slurred speech. CT head negative. CT spine showed impingement at L3-S1, C5-6, and T8-9. PMH: HTN, diastolic CHF, hx of bilateral PE, CAD, DM  Clinical Impression  Patient admitted with above findings. Patient presents with generalized weakness, impaired balance, impaired sensation, decreased activity tolerance, and pain. Patient initially guarded about mobility with therapists but with encouragement from therapists and wife, patient finally agreeable. Patient requires minA for bed mobility and minA+2 to perform sit to stand. Patient ambulated short room distance with RW and min guard but complained of pins/needles in calves and feet and subjectively progressing to low back. Wife and patient requesting short term rehab at SNF prior to returning home. Patient will benefit from skilled PT services during acute stay to address listed deficits. Recommend SNF to maximize functional independence and safety.        Recommendations for follow up therapy are one component of a multi-disciplinary discharge planning process, led by the attending physician.  Recommendations may be updated based on patient status, additional functional criteria and insurance authorization.  Follow Up Recommendations Skilled nursing-short term rehab (<3 hours/day)    Assistance Recommended at Discharge Frequent or constant Supervision/Assistance  Patient can return home with the following  A little help with walking and/or transfers;A lot of help with bathing/dressing/bathroom;Assistance with cooking/housework;Assistance with feeding;Direct supervision/assist for medications management;Direct supervision/assist for financial management;Assist for transportation;Help with stairs or ramp for entrance     Equipment Recommendations Rolling Brandelyn Henne (2 wheels)  Recommendations for Other Services       Functional Status Assessment Patient has had a recent decline in their functional status and/or demonstrates limited ability to make significant improvements in function in a reasonable and predictable amount of time     Precautions / Restrictions Precautions Precautions: Fall Restrictions Weight Bearing Restrictions: No      Mobility  Bed Mobility Overal bed mobility: Needs Assistance Bed Mobility: Supine to Sit, Sit to Supine     Supine to sit: Min assist Sit to supine: Min assist   General bed mobility comments: minA to come into sitting with posterior lean initially. MinA to return to supine with LE management    Transfers Overall transfer level: Needs assistance Equipment used: Rolling Brandice Busser (2 wheels) Transfers: Sit to/from Stand Sit to Stand: From elevated surface, Min assist, +2 safety/equipment           General transfer comment: minA+2 to stand from highly elevated surface. Cues for anterior lean prior to standing due to patient's posterior lean    Ambulation/Gait Ambulation/Gait assistance: Min guard Gait Distance (Feet): 30 Feet Assistive device: Rolling Zayanna Pundt (2 wheels) Gait Pattern/deviations: Step-through pattern, Decreased stride length, Trunk flexed Gait velocity: decreased     General Gait Details: min guard for safety. patient reports sharp, pins/needles at calf and feet initially progressing to low back but unsure if patient was feeling "aching" due to [facility] and difficult to communicate effectively at some points in the session  Stairs            Wheelchair Mobility    Modified Rankin (Stroke Patients Only)       Balance Overall balance assessment: Needs assistance Sitting-balance support: Bilateral upper extremity supported, Feet supported Sitting balance-Leahy Scale: Poor Sitting balance - Comments: minA to maintain sitting balance  due to posterior lean Postural control: Posterior  lean Standing balance support: Bilateral upper extremity supported, During functional activity, Reliant on assistive device for balance Standing balance-Leahy Scale: Poor                               Pertinent Vitals/Pain Pain Assessment Pain Assessment: Faces Faces Pain Scale: Hurts little more Pain Location: bilateral feet/legs Pain Descriptors / Indicators: Pins and needles, Sharp Pain Intervention(s): Monitored during session, Repositioned    Home Living Family/patient expects to be discharged to:: Skilled nursing facility                        Prior Function Prior Level of Function : Independent/Modified Independent             Mobility Comments: was using cane prior to fall on 1/19. Since then has had difficulty ambulating       Hand Dominance        Extremity/Trunk Assessment   Upper Extremity Assessment Upper Extremity Assessment: Defer to OT evaluation    Lower Extremity Assessment Lower Extremity Assessment: Generalized weakness;RLE deficits/detail;LLE deficits/detail RLE Deficits / Details: grossly 4-/5 RLE Sensation: decreased proprioception;history of peripheral neuropathy RLE Coordination: decreased fine motor;decreased gross motor LLE Deficits / Details: grossly 4-/5 LLE Sensation: decreased proprioception;history of peripheral neuropathy LLE Coordination: decreased fine motor;decreased gross motor    Cervical / Trunk Assessment Cervical / Trunk Assessment: Kyphotic  Communication   Communication: No difficulties  Cognition Arousal/Alertness: Awake/alert Behavior During Therapy: WFL for tasks assessed/performed Overall Cognitive Status: Within Functional Limits for tasks assessed                                 General Comments: seems WFL and wife present and didn't express concerns about cognition        General Comments      Exercises      Assessment/Plan    PT Assessment Patient needs continued PT services  PT Problem List Decreased strength;Decreased activity tolerance;Decreased balance;Decreased mobility;Decreased coordination;Decreased range of motion;Decreased knowledge of use of DME;Decreased safety awareness;Decreased knowledge of precautions;Impaired sensation       PT Treatment Interventions DME instruction;Gait training;Stair training;Therapeutic activities;Functional mobility training;Therapeutic exercise;Balance training;Neuromuscular re-education;Patient/family education    PT Goals (Current goals can be found in the Care Plan section)  Acute Rehab PT Goals Patient Stated Goal: to go to rehab and get stronger PT Goal Formulation: With patient/family Time For Goal Achievement: 10/06/21 Potential to Achieve Goals: Fair    Frequency Min 3X/week     Co-evaluation               AM-PAC PT "6 Clicks" Mobility  Outcome Measure Help needed turning from your back to your side while in a flat bed without using bedrails?: A Little Help needed moving from lying on your back to sitting on the side of a flat bed without using bedrails?: A Little Help needed moving to and from a bed to a chair (including a wheelchair)?: A Little Help needed standing up from a chair using your arms (e.g., wheelchair or bedside chair)?: A Lot Help needed to walk in hospital room?: A Lot Help needed climbing 3-5 steps with a railing? : A Lot 6 Click Score: 15    End of Session Equipment Utilized During Treatment: Gait belt Activity Tolerance: Patient tolerated treatment well Patient left: in bed;with call bell/phone within reach;with  bed alarm set;with family/visitor present Nurse Communication: Mobility status PT Visit Diagnosis: Unsteadiness on feet (R26.81);Muscle weakness (generalized) (M62.81);History of falling (Z91.81);Difficulty in walking, not elsewhere classified (R26.2);Other symptoms and signs involving the nervous  system (Z61.096(R29.898)    Time: [id number] PT Time Calculation (min) (ACUTE ONLY): 36 min   Charges:   PT Evaluation $PT Eval Moderate Complexity: 1 Mod          Aston Lieske A. Dan HumphreysWalker, PT, DPT Acute Rehabilitation Services Pager [phone] Office [phone]   Viviann Sparelayna A Iliany Losier 09/22/2021, 5:17 PM

## 2021-09-22 NOTE — Progress Notes (Signed)
NEUROLOGY CONSULTATION PROGRESS NOTE   Date of service: September 22, 2021 Patient Name: Brandon [name] MRN:  [id number] DOB:  February 01, 1941   Interval Hx   [place] on Thursday afternoon. He felt weak an at down. He was using his cane prior to that.  He reports his legs are swollen more. Wife reports that he does not take his lasix.  Since Thursday, he has been urinating every 20-30 mins.  Vitals   Vitals:   09/21/21 1930 09/21/21 2345 09/22/21 0409 09/22/21 0718  BP: 136/89 132/88 134/72 130/80  Pulse: 67 70 62 62  Resp: 18 18 16 18   Temp: 98.6 F (37 C) 97.9 F (36.6 C) 98.3 F (36.8 C) 98.4 F (36.9 C)  TempSrc: Oral Axillary Axillary   SpO2: 99% 99% 96% 96%     There is no height or weight on file to calculate BMI.  Physical Exam   General: Laying comfortably in bed; in no acute distress.  HENT: Normal oropharynx and mucosa. Normal external appearance of ears and nose.  Neck: Supple, no pain or tenderness  CV: No JVD. No peripheral edema.  Pulmonary: Symmetric Chest rise. Normal respiratory effort.  Abdomen: Soft to touch, non-tender.  Ext: No cyanosis, edema, or deformity  Skin: No rash. Normal palpation of skin.   Musculoskeletal: Normal digits and nails by inspection. No clubbing.   Neurologic Examination  Mental status/Cognition: Alert, oriented to self, place, month and year, good attention.  Speech/language: Fluent, comprehension intact, object naming intact, repetition intact.  Cranial nerves:   CN II Pupils equal and reactive to light, no VF deficits    CN III,IV,VI EOM intact, no gaze preference or deviation, no nystagmus    CN V normal sensation in V1, V2, and V3 segments bilaterally    CN VII no asymmetry, no nasolabial fold flattening    CN VIII normal hearing to speech    CN IX & X normal palatal elevation, no uvular deviation    CN XI 5/5 head turn and 5/5 shoulder shrug bilaterally    CN XII midline tongue protrusion    Motor:  Muscle bulk:  normal, tone normal, pronator drift none tremor none Mvmt Root Nerve  Muscle Right Left Comments  SA C5/6 Ax Deltoid     EF C5/6 Mc Biceps 5 5   EE C6/7/8 Rad Triceps 5 5   WF C6/7 Med FCR     WE C7/8 PIN ECU     F Ab C8/T1 U ADM/FDI 5 5   HF L1/2/3 Fem Illopsoas 2 2   KE L2/3/4 Fem Quad 2 2   DF L4/5 D Peron Tib Ant 4 4   PF S1/2 Tibial Grc/Sol 4 4    Reflexes:  Right Left Comments  Pectoralis      Biceps (C5/6) 2 2   Brachioradialis (C5/6) 2 2    Triceps (C6/7) 2 2    Patellar (L3/4) 1 1    Achilles (S1) 0 0    Hoffman      Plantar     Jaw jerk    Sensation:  Light touch Intact throughout   Pin prick    Temperature    Vibration   Proprioception    Coordination/Complex Motor:  - Finger to Nose intact BL - Heel to shin unable to do. - Rapid alternating movement are normal - Gait: unsafe to assess given the extent of his weakness.  Labs   Basic Metabolic Panel:  Lab Results  Component Value Date  NA 135 09/22/2021   K 3.1 (L) 09/22/2021   CO2 24 09/22/2021   GLUCOSE 132 (H) 09/22/2021   BUN 15 09/22/2021   CREATININE 1.03 09/22/2021   CALCIUM 9.0 09/22/2021   GFRNONAA >60 09/22/2021   GFRAA >60 01/02/2019   HbA1c: No results found for: HGBA1C LDL: No results found for: [facility] Urine Drug Screen:     Component Value Date/Time   LABOPIA NONE DETECTED 09/21/2021 0620   COCAINSCRNUR NONE DETECTED 09/21/2021 0620   LABBENZ NONE DETECTED 09/21/2021 0620   AMPHETMU NONE DETECTED 09/21/2021 0620   THCU NONE DETECTED 09/21/2021 0620   LABBARB NONE DETECTED 09/21/2021 0620    Alcohol Level     Component Value Date/Time   ETH <10 09/21/2021 0513   No results found for: PHENYTOIN, ZONISAMIDE, LAMOTRIGINE, LEVETIRACETA No results found for: PHENYTOIN, PHENOBARB, VALPROATE, CBMZ  Imaging and Diagnostic studies   CTH w/o contrast: Stable non contrast CT appearance of the brain. No acute intracranial hemorrhage or acute cortically based infarct  identified.  Repeat CTH:  No acute abnormalities  CT Angio head and neck: Negative for LVO.   Impression   Brandon [name] is a 81 y.o. male with a history of CHF, diabetes, hyperlipidemia, hypertension, BPH who presents with mild confusion, BL lower ext weakness and edema.  Mentation is clearing up but still unreliable historian. Wife reports that his leg weakness started after fall on Thursday. He was walking with his cane prior to this. He has been urinating every 20-30 mins since fall on Thursday.  Recommendations  - CT, C, T, L spine without contrast. Unfortunately unable to get contrast. - CK, B12, Folate, B1, RPR, HIV, Vit B6. - Might need CT Myelogram if CT spine is negative.  ___________________________________________________________________   Thank you for the opportunity to take part in the care of this patient. If you have any further questions, please contact the neurology consultation attending.  Signed,  [place] Pager Number [phone]

## 2021-09-22 NOTE — Progress Notes (Signed)
[facility] PROGRESS NOTE   Brandon [name] [id number] DOB: 01/17/1941 DOA: 09/21/2021  0 DOS: the patient was seen and examined on 09/22/2021  PCP: Raina Mina., MD  Brief History and Hospital Course:  81 y.o. 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  At baseline patient ambulates with the use of a cane and has some memory issues after being on life support for a period in time after having bilateral pulmonary emboli.  Apparently patient had a fall in his yard.  Neighbors helped him back into the house.  Has had urinary frequency and some incontinence as well.  Blood pressure was elevated.  Patient was brought into the emergency department as a code stroke.  Not a tPA candidate as he was out of the window.  Seen by neurology.  Was hospitalized for further evaluation and management.  MRI was ordered but cannot be done because he has spinal stimulator which was not known previously.   Consultants: Neurology  Procedures: Transthoracic echocardiogram is pending    Subjective: Patient complains of weakness in both of his legs.  Onset of this weakness is not entirely clear.  Could be within the last few days to weeks.  But according to his wife he was ambulating particularly recently.    Assessment/Plan:  * Lower extremity weakness- (present on admission) Patient has ambulatory dysfunction at baseline but is able to ambulate with her cane.  He has noticed some increasing weakness in his legs over the past few days.  Patient reports a history of neuropathy previously. Reports back pain in the lower back.  We will proceed with a CT of the thoracic and lumbar spine.  Cannot do MRI due to presence of spinal stimulator. Would appreciate neurology input regarding this matter as well. PT and OT  evaluation.  Acute metabolic encephalopathy- (present on admission) Patient apparently noted to be more confused than his usual.  Possibility was for hypertensive encephalopathy versus seizure versus stroke or TIA. Blood pressure has improved.  Mentation seems to be better today. Infectious work-up was unremarkable with negative UA and negative chest x-ray.  COVID-19 and influenza PCR negative.  CT head was unremarkable.  CT angiogram did not show any large vessel occlusion but atherosclerosis was noted in the intracranial vessels with moderate irregularity and stenosis of the right PCA P1 and P2 segments. MRI cannot be done because he has a spinal stimulator.  CT scan repeated and does not show any acute process either.  Patient mainly concerned by his bilateral lower extremity weakness.  See discussion below.  History of pulmonary embolism- (present on admission) On chronic anticoagulation with warfarin.  Currently on Lovenox for bridging.  Coumadin per pharmacy.  Hypertensive urgency- (present on admission) Presented with a significantly high blood pressure of A999333 systolic.  Seems to have improved.  Noted to be on ACE inhibitor alone.  Chronic diastolic CHF (congestive heart failure) ([place])- (present on admission) Was given Lasix x2.  Seems to be reasonably well compensated. Continue ACE inhibitor.  Replace potassium. Mildly elevated troponin likely of no clinical significance at this time since he does not have any chest discomfort or dyspnea.  DM (diabetes mellitus) ([place]) Not on any medications prior to admission.  HbA1c in our system.  We will check 1 in the morning.  Glucose level noted to be 132 this morning.  CBGs not being checked currently.  Mixed hyperlipidemia- (present on admission) Currently on fenofibrate.  Benign prostatic hyperplasia with urinary frequency- (present on admission) Continue Flomax and finasteride.      DVT Prophylaxis: Lovenox Code Status: Full  code Family Communication: Discussed with patient.  No family at bedside Disposition Plan: To be determined  Status is: Observation  The patient will require care spanning > 2 midnights and should be moved to inpatient because: Inability to ambulate        Medications: Scheduled:  DULoxetine  30 mg Oral QPM   enoxaparin (LOVENOX) injection  100 mg Subcutaneous Q12H   fenofibrate  160 mg Oral QHS   finasteride  5 mg Oral Q lunch   lisinopril  10 mg Oral Daily   pantoprazole  20 mg Oral Daily   sodium chloride flush  3 mL Intravenous Q12H   tamsulosin  0.4 mg Oral Q lunch   warfarin  5 mg Oral ONCE-1600   Warfarin - Pharmacist Dosing Inpatient   Does not apply q1600   Continuous: [id number] **OR** acetaminophen, albuterol, ondansetron **OR** ondansetron (ZOFRAN) IV, oxyCODONE-acetaminophen **AND** oxyCODONE  Antibiotics: Anti-infectives (From admission, onward)    None       Objective:  Vital Signs  Vitals:   09/21/21 1930 09/21/21 2345 09/22/21 0409 09/22/21 0718  BP: 136/89 132/88 134/72 130/80  Pulse: 67 70 62 62  Resp: 18 18 16 18   Temp: 98.6 F (37 C) 97.9 F (36.6 C) 98.3 F (36.8 C) 98.4 F (36.9 C)  TempSrc: Oral Axillary Axillary   SpO2: 99% 99% 96% 96%    Intake/Output Summary (Last 24 hours) at 09/22/2021 1122 Last data filed at 09/22/2021 0410 Gross per 24 hour  Intake 240 ml  Output 900 ml  Net -660 ml   There were no vitals filed for this visit.  General appearance: Awake alert.  In no distress Resp: Clear to auscultation bilaterally.  Normal effort Cardio: S1-S2 is normal regular.  No S3-S4.  No rubs murmurs or bruit GI: Abdomen is soft.  Nontender nondistended.  Bowel sounds are present normal.  No masses organomegaly Extremities: No edema.  Full range of motion of lower extremities. Neurologic: Alert and oriented x3.  Unable to raise either of his legs off the bed.  Could not appreciate patellar reflexes.  Is able to move his  ankles bilaterally.   Lab Results:  Data Reviewed: I have personally reviewed labs and imaging study reports  CBC: Recent Labs  Lab 09/21/21 0513 09/21/21 0523 09/22/21 0139  WBC 9.9  --  9.3  NEUTROABS 6.3  --   --   HGB 13.8 15.0 13.2  HCT 43.7 44.0 40.7  MCV 90.9  --  87.9  PLT 181  --  0000000    Basic Metabolic Panel: Recent Labs  Lab 09/21/21 0513 09/21/21 0523 09/22/21 0139  NA 139 139 135  K 4.3 4.2 3.1*  CL 105 104 104  CO2 23  --  24  GLUCOSE 128* 127* 132*  BUN 9 12 15   CREATININE 0.96 0.80 1.03  CALCIUM 9.3  --  9.0    GFR: CrCl cannot be calculated (Unknown ideal weight.).  Liver Function Tests: Recent Labs  Lab 09/21/21 0513  AST 31  ALT 19  ALKPHOS 71  BILITOT 1.1  PROT 7.2  ALBUMIN 3.8     Coagulation Profile: Recent Labs  Lab 09/21/21 0513 09/22/21 0139  INR 1.6* 1.4*     CBG: Recent Labs  Lab 09/21/21 0514  GLUCAP 125*     Thyroid Function Tests: Recent Labs    09/21/21 1624  TSH 0.478     Recent Results (from the past 240 hour(s))  Resp Panel by RT-PCR (Flu A&B, Covid) Nasopharyngeal Swab     Status: None   Collection Time: 09/21/21  5:13 AM   Specimen: Nasopharyngeal Swab; Nasopharyngeal(NP) swabs in vial transport medium  Result Value Ref Range Status   SARS Coronavirus 2 by RT PCR NEGATIVE NEGATIVE Final    Comment: (NOTE) SARS-CoV-2 target nucleic acids are NOT DETECTED.  The SARS-CoV-2 RNA is generally detectable in upper respiratory specimens during the acute phase of infection. The lowest concentration of SARS-CoV-2 viral copies this assay can detect is 138 copies/mL. A negative result does not preclude SARS-Cov-2 infection and should not be used as the sole basis for treatment or other patient management decisions. A negative result may occur with  improper specimen collection/handling, submission of specimen other than nasopharyngeal swab, presence of viral mutation(s) within the areas targeted by  this assay, and inadequate number of viral copies(<138 copies/mL). A negative result must be combined with clinical observations, patient history, and epidemiological information. The expected result is Negative.  Fact Sheet for Patients:  EntrepreneurPulse.com.au  Fact Sheet for Healthcare Providers:  IncredibleEmployment.be  This test is no t yet approved or cleared by the [place] FDA and  has been authorized for detection and/or diagnosis of SARS-CoV-2 by FDA under an Emergency Use Authorization (EUA). This EUA will remain  in effect (meaning this test can be used) for the duration of the COVID-19 declaration under Section 564(b)(1) of the Act, 21 U.S.C.section 360bbb-3(b)(1), unless the authorization is terminated  or revoked sooner.       Influenza A by PCR NEGATIVE NEGATIVE Final   Influenza B by PCR NEGATIVE NEGATIVE Final    Comment: (NOTE) The Xpert Xpress SARS-CoV-2/FLU/RSV plus assay is intended as an aid in the diagnosis of influenza from Nasopharyngeal swab specimens and should not be used as a sole basis for treatment. Nasal washings and aspirates are unacceptable for Xpert Xpress SARS-CoV-2/FLU/RSV testing.  Fact Sheet for Patients: EntrepreneurPulse.com.au  Fact Sheet for Healthcare Providers: IncredibleEmployment.be  This test is not yet approved or cleared by the [place] FDA and has been authorized for detection and/or diagnosis of SARS-CoV-2 by FDA under an Emergency Use Authorization (EUA). This EUA will remain in effect (meaning this test can be used) for the duration of the COVID-19 declaration under Section 564(b)(1) of the Act, 21 U.S.C. section 360bbb-3(b)(1), unless the authorization is terminated or revoked.  Performed at [facility], [place] [address], [place], [place] [address]       Radiology Studies: CT HEAD WO CONTRAST (5MM)  Result Date:  09/22/2021 CLINICAL DATA:  81 year old male status post code stroke presentation yesterday. EXAM: CT HEAD WITHOUT CONTRAST TECHNIQUE: Contiguous axial images were obtained from the base of the skull through the vertex without intravenous contrast. RADIATION DOSE REDUCTION: This exam was performed according to the departmental dose-optimization program which includes automated exposure control, adjustment of the mA and/or kV according to patient size and/or use of iterative reconstruction technique. COMPARISON:  CT head and CTA head and neck yesterday. FINDINGS: Brain: Stable non contrast CT appearance of the brain. Mild nonspecific ventriculomegaly and patchy, confluent periventricular white matter hypodensity. No midline shift, mass effect, or evidence of intracranial mass lesion. No acute intracranial hemorrhage identified. No cortically based acute infarct identified. Vascular: Calcified atherosclerosis  at the skull base. Skull: No acute osseous abnormality identified. Sinuses/Orbits: Visualized paranasal sinuses and mastoids are stable and well aerated. Other: No acute orbit or scalp soft tissue finding. IMPRESSION: Stable non contrast CT appearance of the brain. No acute intracranial hemorrhage or acute cortically based infarct identified. Electronically Signed   By: Genevie Ann M.D.   On: 09/22/2021 08:19   DG Chest Port 1 View  Result Date: 09/21/2021 CLINICAL DATA:  81 year old male code stroke presentation. EXAM: PORTABLE CHEST 1 VIEW COMPARISON:  CTA neck 0536 hours today. Chest radiograph 06/25/2017 and earlier. FINDINGS: Portable AP upright view at 0609 hours. Chronic elevation of the left hemidiaphragm with subjacent bowel is stable. Chronic lower thoracic spinal stimulator device. Mediastinal contours remain within normal limits. Visualized tracheal air column is within normal limits. Chronic left lung base atelectasis. Elsewhere when allowing for portable technique the lungs are clear. IMPRESSION: No  acute cardiopulmonary abnormality. Chronic elevation of the left hemidiaphragm. Electronically Signed   By: Genevie Ann M.D.   On: 09/21/2021 06:47   CT HEAD CODE STROKE WO CONTRAST  Result Date: 09/21/2021 CLINICAL DATA:  Code stroke. 81 year old male with left side weakness and abnormal speech. EXAM: CT HEAD WITHOUT CONTRAST TECHNIQUE: Contiguous axial images were obtained from the base of the skull through the vertex without intravenous contrast. RADIATION DOSE REDUCTION: This exam was performed according to the departmental dose-optimization program which includes automated exposure control, adjustment of the mA and/or kV according to patient size and/or use of iterative reconstruction technique. COMPARISON:  Head CT 12/27/2020. FINDINGS: Brain: Stable cerebral volume. No midline shift, ventriculomegaly, mass effect, evidence of mass lesion, intracranial hemorrhage or evidence of cortically based acute infarction. Patchy and confluent bilateral periventricular white matter hypodensity appears stable. Vascular: Calcified atherosclerosis at the skull base. No suspicious intracranial vascular hyperdensity. Skull: No acute osseous abnormality identified. Sinuses/Orbits: Visualized paranasal sinuses and mastoids are clear. Other: No acute orbit or scalp soft tissue finding. ASPECTS [facility] Stroke Program Early CT Score) Total score (0-10 with 10 being normal): 10 IMPRESSION: 1. Stable non contrast CT appearance of the brain with chronic white matter disease. ASPECTS 10. 2. These results were communicated to Dr. Leonel Ramsay at 5:35 am on 09/21/2021 by text page via the [facility] messaging system. Electronically Signed   By: Genevie Ann M.D.   On: 09/21/2021 05:35   CT ANGIO HEAD NECK W WO CM (CODE STROKE)  Result Date: 09/21/2021 CLINICAL DATA:  81 year old male code stroke presentation. EXAM: CT ANGIOGRAPHY HEAD AND NECK TECHNIQUE: Multidetector CT imaging of the head and neck was performed using the standard protocol  during bolus administration of intravenous contrast. Multiplanar CT image reconstructions and MIPs were obtained to evaluate the vascular anatomy. Carotid stenosis measurements (when applicable) are obtained utilizing NASCET criteria, using the distal internal carotid diameter as the denominator. RADIATION DOSE REDUCTION: This exam was performed according to the departmental dose-optimization program which includes automated exposure control, adjustment of the mA and/or kV according to patient size and/or use of iterative reconstruction technique. CONTRAST:  65mL OMNIPAQUE IOHEXOL 350 MG/ML SOLN COMPARISON:  Neck CT 08/30/2021. FINDINGS: CTA NECK Skeleton: Advanced cervical spine degeneration, with intervening interbody ankylosis, facet ankylosis, and severe facet arthropathy. No acute osseous abnormality identified. Upper chest: Negative. Other neck: Partially calcified 14 mm right inferior thyroid pole nodule. Not clinically significant; no follow-up imaging recommended (ref: J Am Coll Radiol. 2015 Feb;12(2): 143-50). Asymmetric nodular enhancement of the right submandibular gland in an area of about 17 mm,  with a small superior pole are feeding vessel (series 9, image 82). This corresponds to the heterogeneous portion of the gland described in December. Other face soft tissues appear negative. Aortic arch: 3 vessel arch configuration with tortuous proximal great vessels. Mild calcified arch atherosclerosis. Right carotid system: Tortuous brachiocephalic artery and right CCA origin without plaque or stenosis. Mild calcified plaque at the right ICA origin and bulb without stenosis. Mildly tortuous right ICA. Left carotid system: Tortuous proximal left CCA without plaque or stenosis. Mild calcified plaque at the left ICA origin and bulb. No stenosis results. Vertebral arteries: Tortuous proximal right subclavian artery without stenosis. Mild calcified plaque at the right vertebral artery origin and mild origin  stenosis. Non dominant right vertebral artery remains patent through the V2 segment. There is proximal V3 segment calcified plaque with additional mild stenosis on series 7, image 24. The vessel is diminutive but remains patent to the skull base. Tortuous proximal left subclavian artery without stenosis. Normal left vertebral artery origin. Dominant left vertebral artery. Mild V2 segment calcified plaque but no left vertebral stenosis to the skull base. CTA HEAD Posterior circulation: Dominant left vertebral V4 segment primarily supplies the basilar with normal left PICA origin. Diminutive right vertebral artery functionally terminates in PICA although there is a small V4 continuation to the basilar. Patent basilar artery with mild irregularity but no stenosis. Patent SCA and PCA origins. Left PCA branches are within normal limits. There is moderate somewhat long segment irregularity and stenosis of the right P1 and P2 best seen on series 11, image 24. But distal right PCA branches do appear patent and appear relatively normal. Anterior circulation: Both ICA siphons are patent. Mild to moderate calcified siphon plaque bilaterally. No significant right siphon stenosis. On the left there is mild stenosis of the proximal cavernous ICA. Patent carotid termini. Patent MCA and ACA origins. Normal anterior communicating artery. Bilateral ACA branches are within normal limits. Left MCA M1 segment and bifurcation are patent without stenosis. No left MCA branch occlusion. There is mild irregularity of the posterior left M2 (series 12, image 35). Right MCA M1 segment is tortuous. Right MCA bifurcation is patent without stenosis. Right MCA branches are patent. Mild right M2 and M3 branch irregularity. Venous sinuses: Patent. Anatomic variants: Dominant left vertebral artery primarily supplies the basilar. The right is diminutive and functionally terminates in PICA. Review of the MIP images confirms the above findings  IMPRESSION: 1. Negative for large vessel occlusion. 2. 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 Mild right ICA siphon stenosis. Other bilateral carotid plaque with no significant stenosis. 3. 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 Salient findings were communicated to Dr. Leonel Ramsay at 6:07 am on 09/21/2021 by text page via the [facility] messaging system. Electronically Signed   By: Genevie Ann M.D.   On: 09/21/2021 06:07       LOS: 0 days   [facility] Pager on [email or web address]  09/22/2021, 11:22 AM

## 2021-09-22 NOTE — Assessment & Plan Note (Addendum)
Patient was experiencing urinary incontinence and frequent urination.  His Flomax and finasteride were held.  He has been started on oxybutynin.  Dose can be increased depending on his symptoms.

## 2021-09-22 NOTE — Assessment & Plan Note (Addendum)
Patient apparently noted to be more confused than his usual at the time of admission.  Differential diagnosis included hypertensive encephalopathy versus stroke or TIA. Infectious work-up was unremarkable with negative UA and negative chest x-ray.  COVID-19 and influenza PCR negative.  CT head was unremarkable.  CT angiogram did not show any large vessel occlusion but atherosclerosis was noted in the intracranial vessels with moderate irregularity and stenosis of the right PCA P1 and P2 segments. MRI cannot be done because he has a spinal stimulator.  CT scan repeated and does not show any acute process either.  Mentation has returned back to normal.  Patient was seen by neurology.  No further work-up at this time.

## 2021-09-22 NOTE — Assessment & Plan Note (Signed)
Currently on fenofibrate. 

## 2021-09-22 NOTE — Assessment & Plan Note (Addendum)
Was given Lasix x2 at admission.  Now seems to be reasonably well compensated.   Echocardiogram done during this admission showed normal systolic function.  Grade 1 diastolic dysfunction noted. Holding off on further doses of diuretics.   ACE inhibitor being continued.  Mildly elevated troponin likely of no clinical significance at this time since he does not have any chest discomfort or dyspnea.

## 2021-09-22 NOTE — Assessment & Plan Note (Addendum)
Presented with a significantly high blood pressure of [id number] systolic.  Blood pressure has improved.  Continue ACE inhibitor.

## 2021-09-23 DIAGNOSIS — E1169 Type 2 diabetes mellitus with other specified complication: Secondary | ICD-10-CM | POA: Diagnosis not present

## 2021-09-23 DIAGNOSIS — G9340 Encephalopathy, unspecified: Secondary | ICD-10-CM | POA: Diagnosis not present

## 2021-09-23 DIAGNOSIS — R29898 Other symptoms and signs involving the musculoskeletal system: Secondary | ICD-10-CM | POA: Diagnosis not present

## 2021-09-23 LAB — COMPREHENSIVE METABOLIC PANEL
ALT: 19 U/L (ref 0–44)
AST: 23 U/L (ref 15–41)
Albumin: 3.3 g/dL — ABNORMAL LOW (ref 3.5–5.0)
Alkaline Phosphatase: 69 U/L (ref 38–126)
Anion gap: 8 (ref 5–15)
BUN: 25 mg/dL — ABNORMAL HIGH (ref 8–23)
CO2: 24 mmol/L (ref 22–32)
Calcium: 9.1 mg/dL (ref 8.9–10.3)
Chloride: 105 mmol/L (ref 98–111)
Creatinine, Ser: 1.24 mg/dL (ref 0.61–1.24)
GFR, Estimated: 59 mL/min — ABNORMAL LOW (ref >60)
Glucose, Bld: 141 mg/dL — ABNORMAL HIGH (ref 70–99)
Potassium: 3.6 mmol/L (ref 3.5–5.1)
Sodium: 137 mmol/L (ref 135–145)
Total Bilirubin: 0.6 mg/dL (ref 0.3–1.2)
Total Protein: 6.6 g/dL (ref 6.5–8.1)

## 2021-09-23 LAB — MAGNESIUM: Magnesium: 1.9 mg/dL (ref 1.7–2.4)

## 2021-09-23 LAB — CBC
HCT: 39.9 % (ref 39.0–52.0)
Hemoglobin: 13 g/dL (ref 13.0–17.0)
MCH: 28.6 pg (ref 26.0–34.0)
MCHC: 32.6 g/dL (ref 30.0–36.0)
MCV: 87.9 fL (ref 80.0–100.0)
Platelets: 184 10*3/uL (ref 150–400)
RBC: 4.54 MIL/uL (ref 4.22–5.81)
RDW: 14.6 % (ref 11.5–15.5)
WBC: 9 10*3/uL (ref 4.0–10.5)
nRBC: 0 % (ref 0.0–0.2)

## 2021-09-23 LAB — PROTIME-INR
INR: 1.5 — ABNORMAL HIGH (ref 0.8–1.2)
Prothrombin Time: 17.7 seconds — ABNORMAL HIGH (ref 11.4–15.2)

## 2021-09-23 LAB — RPR: RPR Ser Ql: NONREACTIVE

## 2021-09-23 MED ORDER — POTASSIUM CHLORIDE CRYS ER 20 MEQ PO TBCR
40.0000 meq | EXTENDED_RELEASE_TABLET | Freq: Once | ORAL | Status: AC
  Administered 2021-09-23: 40 meq via ORAL
  Filled 2021-09-23: qty 2

## 2021-09-23 MED ORDER — VITAMIN B-12 1000 MCG PO TABS: 1000.0000 ug | ORAL_TABLET | Freq: Every day | ORAL | Status: DC

## 2021-09-23 MED ORDER — WARFARIN SODIUM 5 MG PO TABS
5.0000 mg | ORAL_TABLET | Freq: Once | ORAL | Status: AC
  Administered 2021-09-23: 5 mg via ORAL
  Filled 2021-09-23: qty 1

## 2021-09-23 MED ORDER — CYANOCOBALAMIN 1000 MCG/ML IJ SOLN
1000.0000 ug | Freq: Every day | INTRAMUSCULAR | Status: DC
  Administered 2021-09-23 – 2021-09-25 (×3): 1000 ug via INTRAMUSCULAR
  Filled 2021-09-23 (×3): qty 1

## 2021-09-23 NOTE — Progress Notes (Signed)
[facility] PROGRESS NOTE   CLAUDIS MATTHES [id number] DOB: 14-Oct-1940 DOA: 09/21/2021  0 DOS: the patient was seen and examined on 09/23/2021  PCP: Raina Mina., MD  Brief History and Hospital Course:  81 y.o. 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  At baseline patient ambulates with the use of a cane and has some memory issues after being on life support for a period in time after having bilateral pulmonary emboli.  Apparently patient had a fall in his yard.  Neighbors helped him back into the house.  Has had urinary frequency and some incontinence as well.  Blood pressure was elevated.  Patient was brought into the emergency department as a code stroke.  Not a tPA candidate as he was out of the window.  Seen by neurology.  Was hospitalized for further evaluation and management.  MRI was ordered but cannot be done because he has spinal stimulator which was not known previously.  Subsequently underwent CT scan of the cervical thoracic and lumbar spine.  Seen by physical and Occupational Therapy.  Skilled nursing facility is recommended for rehab.   Consultants: Neurology  Procedures: Transthoracic echocardiogram    Subjective: Overall patient feels about the same as yesterday.  He was able to ambulate some with physical therapy.  Positive advised he continues to have weakness in his legs.  Denies any headaches.  No nausea vomiting    Assessment/Plan:  * Lower extremity weakness- (present on admission) Patient has ambulatory dysfunction at baseline but is able to ambulate with her cane.  He has noticed some increasing weakness in his legs over the past few days.  Patient reports a history of neuropathy previously. Patient underwent CT scan of the cervical thoracic and lumbar spine.  Patient  is noted to have evidence for stenoses but not severe enough to cause his symptoms.  MRI could not be done due to presence of spinal stimulator. Neurology continues to follow.  Metabolic work-up has been initiated. CK, folic acid, [id number] levels satisfactory.  HIV nonreactive.  TSH was normal previously. Seen by PT and OT.  Skilled nursing facility recommended for rehabilitation.  Acute metabolic encephalopathy- (present on admission) Patient apparently noted to be more confused than his usual.  Possibility was for hypertensive encephalopathy versus seizure versus stroke or TIA. Mental status seems to be back to baseline. Infectious work-up was unremarkable with negative UA and negative chest x-ray.  COVID-19 and influenza PCR negative.  CT head was unremarkable.  CT angiogram did not show any large vessel occlusion but atherosclerosis was noted in the intracranial vessels with moderate irregularity and stenosis of the right PCA P1 and P2 segments. MRI cannot be done because he has a spinal stimulator.  CT scan repeated and does not show any acute process either.  Patient mainly concerned by his bilateral lower extremity weakness.  See discussion below.  History of pulmonary embolism- (present on admission) On chronic anticoagulation with warfarin.  Currently on Lovenox for bridging.  Coumadin per pharmacy.  Hypertensive urgency- (present on admission) Presented with a significantly high blood pressure of [id number] systolic.  Blood pressure has improved.  Continue to monitor.  Noted to be on lisinopril currently.  Chronic diastolic CHF (congestive heart failure) ([place])- (present on admission) Was given Lasix x2 at admission.  Now seems to be reasonably well compensated.   Echocardiogram  done during this admission showed normal systolic function.  Grade 1 diastolic dysfunction noted. Hold off on further diuretics for now.  Continue ACE inhibitor.  Monitor ins and outs Daily weights.  Potassium is normal  today.  We will give additional dose. Mildly elevated troponin likely of no clinical significance at this time since he does not have any chest discomfort or dyspnea.  DM (diabetes mellitus) ([place]) Not on any medications prior to admission.  HbA1c is pending glucose levels are stable.  No need to check CBGs on more than once daily basis.   Mixed hyperlipidemia- (present on admission) Currently on fenofibrate.  Benign prostatic hyperplasia with urinary frequency- (present on admission) Continue Flomax and finasteride.      DVT Prophylaxis: Lovenox Code Status: Full code Family Communication: Discussed with patient.  No family at bedside Disposition Plan: SNF for short-term rehab  Status is: Observation  The patient will require care spanning > 2 midnights and should be moved to inpatient because: Inability to ambulate        Medications: Scheduled:  cyanocobalamin  1,000 mcg Intramuscular Daily   Followed by   Derrill Memo ON 09/28/2021] vitamin B-12  1,000 mcg Oral Daily   DULoxetine  30 mg Oral QPM   enoxaparin (LOVENOX) injection  100 mg Subcutaneous Q12H   fenofibrate  160 mg Oral QHS   finasteride  5 mg Oral Q lunch   lisinopril  10 mg Oral Daily   pantoprazole  20 mg Oral Daily   sodium chloride flush  3 mL Intravenous Q12H   tamsulosin  0.4 mg Oral Q lunch   warfarin  5 mg Oral ONCE-1600   Warfarin - Pharmacist Dosing Inpatient   Does not apply q1600   Continuous: [id number] **OR** acetaminophen, albuterol, ondansetron **OR** ondansetron (ZOFRAN) IV, oxyCODONE-acetaminophen **AND** oxyCODONE  Antibiotics: Anti-infectives (From admission, onward)    None       Objective:  Vital Signs  Vitals:   09/22/21 1928 09/22/21 2347 09/23/21 0324 09/23/21 0800  BP: 121/71 104/61 (!) 109/59 121/70  Pulse: 68 70 62 64  Resp: 18 16 16 18   Temp: 98.1 F (36.7 C) 98.1 F (36.7 C) 98.1 F (36.7 C) 97.7 F (36.5 C)  TempSrc: Oral Oral Oral Oral  SpO2: 94%  95% 94% 95%    Intake/Output Summary (Last 24 hours) at 09/23/2021 1108 Last data filed at 09/22/2021 2105 Gross per 24 hour  Intake 240 ml  Output 600 ml  Net -360 ml   There were no vitals filed for this visit.   General appearance: Awake alert.  In no distress Resp: Clear to auscultation bilaterally.  Normal effort Cardio: S1-S2 is normal regular.  No S3-S4.  No rubs murmurs or bruit GI: Abdomen is soft.  Nontender nondistended.  Bowel sounds are present normal.  No masses organomegaly Extremities: No edema.  Full range of motion of lower extremities. Neurologic: Alert and oriented x3.  Has normal strength bilateral upper extremities.  Weakness noted both lower extremities right more than left.   Lab Results:  Data Reviewed: I have personally reviewed labs and imaging study reports  CBC: Recent Labs  Lab 09/21/21 0513 09/21/21 0523 09/22/21 0139 09/23/21 0314  WBC 9.9  --  9.3 9.0  NEUTROABS 6.3  --   --   --   HGB 13.8 15.0 13.2 13.0  HCT 43.7 44.0 40.7 39.9  MCV 90.9  --  87.9 87.9  PLT 181  --  193 184    Basic  Metabolic Panel: Recent Labs  Lab 09/21/21 0513 09/21/21 0523 09/22/21 0139 09/23/21 0314  NA 139 139 135 137  K 4.3 4.2 3.1* 3.6  CL 105 104 104 105  CO2 23  --  24 24  GLUCOSE 128* 127* 132* 141*  BUN 9 12 15  25*  CREATININE 0.96 0.80 1.03 1.24  CALCIUM 9.3  --  9.0 9.1  MG  --   --   --  1.9    GFR: CrCl cannot be calculated (Unknown ideal weight.).  Liver Function Tests: Recent Labs  Lab 09/21/21 0513 09/23/21 0314  AST 31 23  ALT 19 19  ALKPHOS 71 69  BILITOT 1.1 0.6  PROT 7.2 6.6  ALBUMIN 3.8 3.3*     Coagulation Profile: Recent Labs  Lab 09/21/21 0513 09/22/21 0139 09/23/21 0314  INR 1.6* 1.4* 1.5*     CBG: Recent Labs  Lab 09/21/21 0514  GLUCAP 125*     Thyroid Function Tests: Recent Labs    09/21/21 1624  TSH 0.478     Recent Results (from the past 240 hour(s))  Resp Panel by RT-PCR (Flu A&B,  Covid) Nasopharyngeal Swab     Status: None   Collection Time: 09/21/21  5:13 AM   Specimen: Nasopharyngeal Swab; Nasopharyngeal(NP) swabs in vial transport medium  Result Value Ref Range Status   SARS Coronavirus 2 by RT PCR NEGATIVE NEGATIVE Final    Comment: (NOTE) SARS-CoV-2 target nucleic acids are NOT DETECTED.  The SARS-CoV-2 RNA is generally detectable in upper respiratory specimens during the acute phase of infection. The lowest concentration of SARS-CoV-2 viral copies this assay can detect is 138 copies/mL. A negative result does not preclude SARS-Cov-2 infection and should not be used as the sole basis for treatment or other patient management decisions. A negative result may occur with  improper specimen collection/handling, submission of specimen other than nasopharyngeal swab, presence of viral mutation(s) within the areas targeted by this assay, and inadequate number of viral copies(<138 copies/mL). A negative result must be combined with clinical observations, patient history, and epidemiological information. The expected result is Negative.  Fact Sheet for Patients:  BloggerCourse.comhttps://www.fda.gov/media/152166/download  Fact Sheet for Healthcare Providers:  SeriousBroker.ithttps://www.fda.gov/media/152162/download  This test is no t yet approved or cleared by the [place] FDA and  has been authorized for detection and/or diagnosis of SARS-CoV-2 by FDA under an Emergency Use Authorization (EUA). This EUA will remain  in effect (meaning this test can be used) for the duration of the COVID-19 declaration under Section 564(b)(1) of the Act, 21 U.S.C.section 360bbb-3(b)(1), unless the authorization is terminated  or revoked sooner.       Influenza A by PCR NEGATIVE NEGATIVE Final   Influenza B by PCR NEGATIVE NEGATIVE Final    Comment: (NOTE) The Xpert Xpress SARS-CoV-2/FLU/RSV plus assay is intended as an aid in the diagnosis of influenza from Nasopharyngeal swab specimens and should  not be used as a sole basis for treatment. Nasal washings and aspirates are unacceptable for Xpert Xpress SARS-CoV-2/FLU/RSV testing.  Fact Sheet for Patients: BloggerCourse.comhttps://www.fda.gov/media/152166/download  Fact Sheet for Healthcare Providers: SeriousBroker.ithttps://www.fda.gov/media/152162/download  This test is not yet approved or cleared by the [place] FDA and has been authorized for detection and/or diagnosis of SARS-CoV-2 by FDA under an Emergency Use Authorization (EUA). This EUA will remain in effect (meaning this test can be used) for the duration of the COVID-19 declaration under Section 564(b)(1) of the Act, 21 U.S.C. section 360bbb-3(b)(1), unless the authorization is terminated or revoked.  Performed at [facility], [place] [address], [place], [place] [address]   Urine Culture     Status: Abnormal   Collection Time: 09/21/21  6:20 AM   Specimen: Urine, Clean Catch  Result Value Ref Range Status   Specimen Description URINE, CLEAN CATCH  Final   Special Requests NONE  Final   Culture (A)  Final    <10,000 COLONIES/mL INSIGNIFICANT GROWTH Performed at [facility], [place] [address] [place], [place] [address]    Report Status 09/22/2021 FINAL  Final      Radiology Studies: CT HEAD WO CONTRAST (5MM)  Result Date: 09/22/2021 CLINICAL DATA:  81 year old male status post code stroke presentation yesterday. EXAM: CT HEAD WITHOUT CONTRAST TECHNIQUE: Contiguous axial images were obtained from the base of the skull through the vertex without intravenous contrast. RADIATION DOSE REDUCTION: This exam was performed according to the departmental dose-optimization program which includes automated exposure control, adjustment of the mA and/or kV according to patient size and/or use of iterative reconstruction technique. COMPARISON:  CT head and CTA head and neck yesterday. FINDINGS: Brain: Stable non contrast CT appearance of the brain. Mild nonspecific ventriculomegaly and patchy,  confluent periventricular white matter hypodensity. No midline shift, mass effect, or evidence of intracranial mass lesion. No acute intracranial hemorrhage identified. No cortically based acute infarct identified. Vascular: Calcified atherosclerosis at the skull base. Skull: No acute osseous abnormality identified. Sinuses/Orbits: Visualized paranasal sinuses and mastoids are stable and well aerated. Other: No acute orbit or scalp soft tissue finding. IMPRESSION: Stable non contrast CT appearance of the brain. No acute intracranial hemorrhage or acute cortically based infarct identified. Electronically Signed   By: Genevie Ann M.D.   On: 09/22/2021 08:19   CT CERVICAL SPINE WO CONTRAST  Result Date: 09/22/2021 CLINICAL DATA:  Back pain.  Bilateral lower extremity weakness. EXAM: CT CERVICAL SPINE WITHOUT CONTRAST TECHNIQUE: Multidetector CT imaging of the cervical spine was performed without intravenous contrast. Multiplanar CT image reconstructions were also generated. RADIATION DOSE REDUCTION: This exam was performed according to the departmental dose-optimization program which includes automated exposure control, adjustment of the mA and/or kV according to patient size and/or use of iterative reconstruction technique. COMPARISON:  CT cervical spine 09/19/2015 FINDINGS: Alignment: Mild anterolisthesis C4-5 Skull base and vertebrae: Negative for fracture or mass Soft tissues and spinal canal: Negative for soft tissue mass or edema. Mild atherosclerotic calcification carotid bifurcation bilaterally. Disc levels: C2-3: Disc degeneration. Left facet degeneration. No significant stenosis C3-4: Disc degeneration with diffuse uncinate spurring. Severe facet hypertrophy on the left and mild facet hypertrophy on the right. Severe left foraminal stenosis. Mild spinal stenosis. Mild right foraminal stenosis C4-5: Moderate facet hypertrophy bilaterally. Mild disc degeneration. Mild foraminal narrowing bilaterally. C5-6: Disc  degeneration with diffuse uncinate spurring. Mild spinal stenosis. Severe foraminal encroachment bilaterally due to spurring. C6-7: Disc degeneration with diffuse uncinate spurring. Severe foraminal encroachment bilaterally. Mild spinal stenosis C7-T1: Bilateral facet degeneration. Mild foraminal narrowing bilaterally. Upper chest: Lung apices clear bilaterally. Other: None IMPRESSION: Multilevel cervical spondylosis. Multilevel spinal and foraminal stenosis most prominent at C5-6 and C6-7. Electronically Signed   By: Franchot Gallo M.D.   On: 09/22/2021 15:32   CT THORACIC SPINE WO CONTRAST  Result Date: 09/22/2021 CLINICAL DATA:  Mid and lower back pain EXAM: CT THORACIC AND LUMBAR SPINE WITHOUT CONTRAST TECHNIQUE: Multidetector CT imaging of the thoracic and lumbar spine was performed without contrast. Multiplanar CT image reconstructions were also generated. RADIATION DOSE REDUCTION: This exam  was performed according to the departmental dose-optimization program which includes automated exposure control, adjustment of the mA and/or kV according to patient size and/or use of iterative reconstruction technique. COMPARISON:  Multiple exams, including CT abdomen 02/01/2020 and CT chest 05/12/2013 FINDINGS: CT THORACIC SPINE FINDINGS Alignment: Mild thoracic kyphosis. No subluxation in the thoracic spine. Vertebrae: Interbody fusion at C6-7. No thoracic spine fracture or acute bony findings identified. Paraspinal and other soft tissues: Coronary, aortic arch, and branch vessel atherosclerotic vascular disease. Elevated left hemidiaphragm. Old granulomatous disease involving the lungs and spleen. 0.8 cm prevascular lymph node on image 56 series 5. Atelectasis along the left hemidiaphragm. Pneumobilia. Disc levels: C5-6: Prominent bilateral foraminal impingement due to uncinate spurring. C6-7: Moderate bilateral foraminal stenosis due to spurring along the intervertebral fusion site. C7-T1: No impingement.   Degenerative facet arthropathy. T1-2: No impingement.  Degenerative left facet arthropathy. T2-3: Unremarkable. T3-4: Unremarkable. T4-5: Unremarkable. T5-6: Unremarkable. T6-7: Unremarkable. T7-8: Unremarkable. T8-9: Mild bilateral foraminal stenosis due to degenerative facet arthropathy. T9-10: Unremarkable. The lead to the dorsal column stimulator are at the T9 and T10 level. T10-11: Unremarkable. T11-12: Unremarkable. CT LUMBAR SPINE FINDINGS Segmentation: The lowest lumbar type non-rib-bearing vertebra is labeled as L5. Alignment: 4 mm degenerative retrolisthesis at L2-3. Mild levoconvex lumbar scoliosis with rotary component. Vertebrae: No fracture or acute bony findings. Mild bridging spurring of the sacroiliac joints. Loss of disc height at the L2-3 level. Paraspinal and other soft tissues: Atherosclerosis is present, including aortoiliac atherosclerotic disease. Nonobstructive left nephrolithiasis. Disc levels: T12-L1: Unremarkable. L1-2: Unremarkable. L2-3: Mild bilateral foraminal stenosis due to intervertebral spurring and degenerative facet arthropathy. L3-4: Suspected prominent central narrowing of the thecal sac and mild bilateral foraminal stenosis due to disc bulge, facet arthropathy, ligamentum flavum redundancy, and intervertebral spurring. L4-5: Suspected prominent central narrowing of the thecal sac and mild bilateral foraminal stenosis again due to facet arthropathy, ligamentum flavum redundancy, disc bulge, and spurring. L5-S1: Moderate left and mild right foraminal stenosis due to facet spurring. IMPRESSION: CT THORACIC SPINE IMPRESSION 1. Mild thoracic kyphosis; spondylosis and degenerative disc disease lead to substantial foraminal impingement at C5-6; moderate bilateral foraminal impingement at C6-7; and mild bilateral foraminal impingement at T8-9. 2. The dorsal column stimulator leads are at the T9 and T10 level. CT LUMBAR SPINE IMPRESSION 1. Lumbar spondylosis and degenerative disc  disease causing prominent impingement at L3-4 and L4-5; moderate impingement at L5-S1; and mild impingement at L2-3, as detailed above. 2. 4 mm degenerative retrolisthesis at L2-3. 3. Other imaging findings of potential clinical significance: Aortic Atherosclerosis (ICD10-I70.0). Coronary atherosclerosis. Elevated left hemidiaphragm with atelectasis in the left lower lobe along the diaphragm. Pneumobilia. Nonobstructive left nephrolithiasis. Electronically Signed   By: Van Clines M.D.   On: 09/22/2021 14:57   CT LUMBAR SPINE WO CONTRAST  Result Date: 09/22/2021 CLINICAL DATA:  Mid and lower back pain EXAM: CT THORACIC AND LUMBAR SPINE WITHOUT CONTRAST TECHNIQUE: Multidetector CT imaging of the thoracic and lumbar spine was performed without contrast. Multiplanar CT image reconstructions were also generated. RADIATION DOSE REDUCTION: This exam was performed according to the departmental dose-optimization program which includes automated exposure control, adjustment of the mA and/or kV according to patient size and/or use of iterative reconstruction technique. COMPARISON:  Multiple exams, including CT abdomen 02/01/2020 and CT chest 05/12/2013 FINDINGS: CT THORACIC SPINE FINDINGS Alignment: Mild thoracic kyphosis. No subluxation in the thoracic spine. Vertebrae: Interbody fusion at C6-7. No thoracic spine fracture or acute bony findings identified. Paraspinal and other soft tissues: Coronary,  aortic arch, and branch vessel atherosclerotic vascular disease. Elevated left hemidiaphragm. Old granulomatous disease involving the lungs and spleen. 0.8 cm prevascular lymph node on image 56 series 5. Atelectasis along the left hemidiaphragm. Pneumobilia. Disc levels: C5-6: Prominent bilateral foraminal impingement due to uncinate spurring. C6-7: Moderate bilateral foraminal stenosis due to spurring along the intervertebral fusion site. C7-T1: No impingement.  Degenerative facet arthropathy. T1-2: No impingement.   Degenerative left facet arthropathy. T2-3: Unremarkable. T3-4: Unremarkable. T4-5: Unremarkable. T5-6: Unremarkable. T6-7: Unremarkable. T7-8: Unremarkable. T8-9: Mild bilateral foraminal stenosis due to degenerative facet arthropathy. T9-10: Unremarkable. The lead to the dorsal column stimulator are at the T9 and T10 level. T10-11: Unremarkable. T11-12: Unremarkable. CT LUMBAR SPINE FINDINGS Segmentation: The lowest lumbar type non-rib-bearing vertebra is labeled as L5. Alignment: 4 mm degenerative retrolisthesis at L2-3. Mild levoconvex lumbar scoliosis with rotary component. Vertebrae: No fracture or acute bony findings. Mild bridging spurring of the sacroiliac joints. Loss of disc height at the L2-3 level. Paraspinal and other soft tissues: Atherosclerosis is present, including aortoiliac atherosclerotic disease. Nonobstructive left nephrolithiasis. Disc levels: T12-L1: Unremarkable. L1-2: Unremarkable. L2-3: Mild bilateral foraminal stenosis due to intervertebral spurring and degenerative facet arthropathy. L3-4: Suspected prominent central narrowing of the thecal sac and mild bilateral foraminal stenosis due to disc bulge, facet arthropathy, ligamentum flavum redundancy, and intervertebral spurring. L4-5: Suspected prominent central narrowing of the thecal sac and mild bilateral foraminal stenosis again due to facet arthropathy, ligamentum flavum redundancy, disc bulge, and spurring. L5-S1: Moderate left and mild right foraminal stenosis due to facet spurring. IMPRESSION: CT THORACIC SPINE IMPRESSION 1. Mild thoracic kyphosis; spondylosis and degenerative disc disease lead to substantial foraminal impingement at C5-6; moderate bilateral foraminal impingement at C6-7; and mild bilateral foraminal impingement at T8-9. 2. The dorsal column stimulator leads are at the T9 and T10 level. CT LUMBAR SPINE IMPRESSION 1. Lumbar spondylosis and degenerative disc disease causing prominent impingement at L3-4 and L4-5;  moderate impingement at L5-S1; and mild impingement at L2-3, as detailed above. 2. 4 mm degenerative retrolisthesis at L2-3. 3. Other imaging findings of potential clinical significance: Aortic Atherosclerosis (ICD10-I70.0). Coronary atherosclerosis. Elevated left hemidiaphragm with atelectasis in the left lower lobe along the diaphragm. Pneumobilia. Nonobstructive left nephrolithiasis. Electronically Signed   By: Van Clines M.D.   On: 09/22/2021 14:57   ECHOCARDIOGRAM COMPLETE  Result Date: 09/22/2021    ECHOCARDIOGRAM REPORT   Patient Name:   MALIN EICKHOFF [facility] Date of Exam: 09/21/2021 Medical Rec #:  [id number]         Height:       76.0 in Accession #:    [id number]        Weight:       223.0 lb Date of Birth:  1941/03/10         BSA:          2.320 m Patient Age:    31 years          BP:           130/80 mmHg Patient Gender: M                 HR:           70 bpm. Exam Location:  Inpatient Procedure: 2D Echo, Cardiac Doppler and Color Doppler Indications:    Acute CHF  History:        Patient has no prior history of Echocardiogram examinations.  CHF, CAD; Risk Factors:Diabetes, Hypertension and Dyslipidemia.  Sonographer:    Clayton Lefort RDCS (AE) Referring Phys: [id number] RONDELL A SMITH IMPRESSIONS  1. Left ventricular ejection fraction, by estimation, is 55 to 60%. The left ventricle has normal function. The left ventricle has no regional wall motion abnormalities. There is mild left ventricular hypertrophy. Left ventricular diastolic parameters are consistent with Grade I diastolic dysfunction (impaired relaxation).  2. Right ventricular systolic function is normal. The right ventricular size is normal.  3. Measured obliquly by tech. Left atrial size was mildly dilated.  4. The mitral valve is abnormal. No evidence of mitral valve regurgitation. No evidence of mitral stenosis.  5. Small gradient no stenosis . The aortic valve is normal in structure. There is moderate calcification of the  aortic valve. There is moderate thickening of the aortic valve. Aortic valve regurgitation is not visualized. Aortic valve sclerosis/calcification is present, without any evidence of aortic stenosis.  6. The inferior vena cava is normal in size with greater than 50% respiratory variability, suggesting right atrial pressure of 3 mmHg. FINDINGS  Left Ventricle: Left ventricular ejection fraction, by estimation, is 55 to 60%. The left ventricle has normal function. The left ventricle has no regional wall motion abnormalities. The left ventricular internal cavity size was normal in size. There is  mild left ventricular hypertrophy. Left ventricular diastolic parameters are consistent with Grade I diastolic dysfunction (impaired relaxation). Right Ventricle: The right ventricular size is normal. No increase in right ventricular wall thickness. Right ventricular systolic function is normal. Left Atrium: Measured obliquly by tech. Left atrial size was mildly dilated. Right Atrium: Right atrial size was normal in size. Pericardium: There is no evidence of pericardial effusion. Mitral Valve: The mitral valve is abnormal. There is mild thickening of the mitral valve leaflet(s). Mild mitral annular calcification. No evidence of mitral valve regurgitation. No evidence of mitral valve stenosis. Tricuspid Valve: The tricuspid valve is normal in structure. Tricuspid valve regurgitation is not demonstrated. No evidence of tricuspid stenosis. Aortic Valve: Small gradient no stenosis. The aortic valve is normal in structure. There is moderate calcification of the aortic valve. There is moderate thickening of the aortic valve. Aortic valve regurgitation is not visualized. Aortic valve sclerosis/calcification is present, without any evidence of aortic stenosis. Aortic valve mean gradient measures 7.0 mmHg. Aortic valve peak gradient measures 12.2 mmHg. Aortic valve area, by VTI measures 3.97 cm. Pulmonic Valve: The pulmonic valve was  normal in structure. Pulmonic valve regurgitation is not visualized. No evidence of pulmonic stenosis. Aorta: The aortic root is normal in size and structure. Venous: The inferior vena cava is normal in size with greater than 50% respiratory variability, suggesting right atrial pressure of 3 mmHg. IAS/Shunts: The interatrial septum was not well visualized.  LEFT VENTRICLE PLAX 2D LVIDd:         5.80 cm     Diastology LVIDs:         3.20 cm     LV e' medial:    7.29 cm/s LV PW:         0.90 cm     LV E/e' medial:  10.0 LV IVS:        1.20 cm     LV e' lateral:   5.44 cm/s LVOT diam:     2.10 cm     LV E/e' lateral: 13.4 LV SV:         146 LV SV Index:   63 LVOT Area:  3.46 cm  LV Volumes (MOD) LV vol d, MOD A2C: 69.5 ml LV vol d, MOD A4C: 88.5 ml LV vol s, MOD A2C: 26.3 ml LV vol s, MOD A4C: 40.1 ml LV SV MOD A2C:     43.2 ml LV SV MOD A4C:     88.5 ml LV SV MOD BP:      48.3 ml RIGHT VENTRICLE RV S prime:     16.20 cm/s TAPSE (M-mode): 1.6 cm LEFT ATRIUM             Index        RIGHT ATRIUM           Index LA diam:        4.80 cm 2.07 cm/m   RA Area:     13.00 cm LA Vol (A2C):   59.4 ml 25.60 ml/m  RA Volume:   32.10 ml  13.84 ml/m LA Vol (A4C):   56.5 ml 24.35 ml/m LA Biplane Vol: 60.6 ml 26.12 ml/m  AORTIC VALVE                     PULMONIC VALVE AV Area (Vmax):    3.64 cm      PV Vmax:       0.84 m/s AV Area (Vmean):   3.61 cm      PV Peak grad:  2.8 mmHg AV Area (VTI):     3.97 cm AV Vmax:           175.00 cm/s AV Vmean:          122.000 cm/s AV VTI:            0.367 m AV Peak Grad:      12.2 mmHg AV Mean Grad:      7.0 mmHg LVOT Vmax:         184.00 cm/s LVOT Vmean:        127.000 cm/s LVOT VTI:          0.421 m LVOT/AV VTI ratio: 1.15  AORTA Ao Root diam: 3.30 cm Ao Asc diam:  3.30 cm MITRAL VALVE MV Area (PHT): 2.91 cm     SHUNTS MV Decel Time: 261 msec     Systemic VTI:  0.42 m MV E velocity: 72.70 cm/s   Systemic Diam: 2.10 cm MV A velocity: 113.00 cm/s MV E/A ratio:  0.64 Jenkins Rouge MD  Electronically signed by Jenkins Rouge MD Signature Date/Time: 09/22/2021/12:41:32 PM    Final        LOS: 0 days   Bonnielee Haff  [facility] Pager on [email or web address]  09/23/2021, 11:08 AM

## 2021-09-23 NOTE — NC FL2 (Signed)
[place] MEDICAID FL2 LEVEL OF CARE SCREENING TOOL     IDENTIFICATION  Patient Name: Brandon [name] Birthdate: 1941/06/06 Sex: male Admission Date (Current Location): 09/21/2021  [facility] [place] Number:  Herbalist and Address:  [facility], [place] [address], [place], [place] [address]      Provider Number: [id number]  Attending Physician Name and Address:  Bonnielee Haff, MD  Relative Name and Phone Number:       Current Level of Care: Hospital Recommended Level of Care: [place] Prior Approval Number:    Date Approved/Denied:   PASRR Number:    Discharge Plan: SNF    Current Diagnoses: Patient Active Problem List   Diagnosis Date Noted   Lower extremity weakness [id number]   Acute metabolic encephalopathy A999333   Hypertensive urgency 09/21/2021   Trigger middle finger of left hand 02/14/2020   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   Vitamin D deficiency 05/21/2019   COPD with chronic bronchitis ([place]) 12/29/2018   Pulmonary embolism on long-term anticoagulation therapy ([place]) 12/29/2018   DM (diabetes mellitus) ([place]) 12/29/2018   Chronic diastolic CHF (congestive heart failure) ([place]) 12/28/2018   Choledocholithiasis 12/28/2018   CKD (chronic kidney disease) stage 3, GFR 30-59 ml/min ([place]) 07/30/2018   Class 1 obesity due to excess calories with serious comorbidity and body mass index (BMI) of 30.0 to 30.9 in adult 01/07/2018   Benign prostatic hyperplasia with urinary frequency 12/02/2017   Balance disorder 08/22/2017   History of pulmonary embolism 02/19/2017   Hammertoe of right foot 05/16/2016   Callus of foot 05/16/2016   Diabetic polyneuropathy associated with type 2 diabetes mellitus ([place]) 04/16/2016   Onychomycosis due to dermatophyte 04/16/2016   Right foot pain 04/16/2016   Primary osteoarthritis involving multiple joints 04/15/2016   Elevated hemidiaphragm 01/24/2016   Gait  abnormality 01/24/2016   Essential hypertension 11/22/2015   Anticoagulant long-term use 11/22/2015   Cervical disc disease 11/22/2015   Gastroesophageal reflux disease 11/22/2015   Gout 11/22/2015   High risk medication use 11/22/2015   Lung mass 11/22/2015   Major depressive disorder with current active episode 11/22/2015   Mixed hyperlipidemia 11/22/2015   Malaise and fatigue 11/22/2015   Testosterone deficiency 11/22/2015   Vitamin B12 deficiency 11/22/2015   Erectile dysfunction 11/22/2015   Edema 11/22/2015    Orientation RESPIRATION BLADDER Height & Weight     Self, Time, Situation, Place  Normal Incontinent Weight:   Height:     BEHAVIORAL SYMPTOMS/MOOD NEUROLOGICAL BOWEL NUTRITION STATUS      Continent Diet (heart healthy)  AMBULATORY STATUS COMMUNICATION OF NEEDS Skin   Limited Assist Verbally Normal                       Personal Care Assistance Level of Assistance  Bathing, Feeding, Dressing Bathing Assistance: Limited assistance Feeding assistance: Independent Dressing Assistance: Limited assistance     Functional Limitations Info  Hearing   Hearing Info: Impaired      SPECIAL CARE FACTORS FREQUENCY  PT (By licensed PT), OT (By licensed OT)     PT Frequency: 5x/wk OT Frequency: 5x/wk            Contractures Contractures Info: Not present    Additional Factors Info  Code Status, Allergies, Psychotropic Code Status Info: Full Allergies Info: Codeine, Statins, Allopurinol, Doxycycline Psychotropic Info: Cymbalta 30mg  every evening         Current Medications (09/23/2021):  This is  the current hospital active medication list Current Facility-Administered Medications  Medication Dose Route Frequency Provider Last Rate Last Admin   acetaminophen (TYLENOL) tablet 650 mg  650 mg Oral Q6H PRN Norval Morton, MD       Or   acetaminophen (TYLENOL) suppository 650 mg  650 mg Rectal Q6H PRN Fuller Plan A, MD       albuterol (PROVENTIL)  (2.5 MG/3ML) 0.083% nebulizer solution 2.5 mg  2.5 mg Nebulization Q6H PRN Norval Morton, MD       cyanocobalamin ((VITAMIN B-12)) injection 1,000 mcg  1,000 mcg Intramuscular Daily Donnetta Simpers, MD       Followed by   Derrill Memo ON 09/28/2021] vitamin B-12 (CYANOCOBALAMIN) tablet 1,000 mcg  1,000 mcg Oral Daily Donnetta Simpers, MD       DULoxetine (CYMBALTA) DR capsule 30 mg  30 mg Oral QPM Smith, Rondell A, MD   30 mg at 09/22/21 1644   enoxaparin (LOVENOX) injection 100 mg  100 mg Subcutaneous Q12H Alvira Philips, RPH   100 mg at 09/22/21 2104   fenofibrate tablet 160 mg  160 mg Oral QHS Smith, Rondell A, MD   160 mg at 09/22/21 2104   finasteride (PROSCAR) tablet 5 mg  5 mg Oral Q lunch Fuller Plan A, MD   5 mg at 09/22/21 1045   lisinopril (ZESTRIL) tablet 10 mg  10 mg Oral Daily Fuller Plan A, MD   10 mg at 09/22/21 1044   ondansetron (ZOFRAN) tablet 4 mg  4 mg Oral Q6H PRN Norval Morton, MD       Or   ondansetron (ZOFRAN) injection 4 mg  4 mg Intravenous Q6H PRN Smith, Rondell A, MD       oxyCODONE-acetaminophen (PERCOCET/ROXICET) 5-325 MG per tablet 1 tablet  1 tablet Oral Q8H PRN Reome, Earle J, RPH       And   oxyCODONE (Oxy IR/ROXICODONE) immediate release tablet 5 mg  5 mg Oral Q8H PRN Reome, Earle J, RPH       pantoprazole (PROTONIX) EC tablet 20 mg  20 mg Oral Daily Smith, Rondell A, MD   20 mg at 09/22/21 1045   sodium chloride flush (NS) 0.9 % injection 3 mL  3 mL Intravenous Q12H Smith, Rondell A, MD   3 mL at 09/22/21 2105   tamsulosin (FLOMAX) capsule 0.4 mg  0.4 mg Oral Q lunch Fuller Plan A, MD   0.4 mg at 09/22/21 1644   warfarin (COUMADIN) tablet 5 mg  5 mg Oral ONCE-1600 Willette Cluster, [facility]       Warfarin - Pharmacist Dosing Inpatient   Does not apply q1600 von Caren Griffins, [facility]   Given at 09/22/21 1659     Discharge Medications: Please see discharge summary for a list of discharge medications.  Relevant Imaging Results:  Relevant  Lab Results:   Additional Information SS#: [id number]  Geralynn Ochs, LCSW

## 2021-09-23 NOTE — Progress Notes (Signed)
ANTICOAGULATION CONSULT NOTE  Pharmacy Consult for warfarin and Lovenox Indication: pulmonary embolus history  Height: Weight: 219.6 lb (99.8 kg) per RN  Assessment: 80 yom with hx of PE on warfarin PTA presenting with confusion and slurred speech. Pharmacy consulted to bridge with Lovenox for subtherapeutic INR at 1.6 on presentation (missed dose 1/19), CBC wnl. No active bleed issues reported.  INR this am is 1.5, CBC stable, no signs or symptoms of bleeding  PTA warfarin dose: 5mg  daily (last dose 1/18 PTA)  Goal of Therapy:  INR 2-3 Anti-Xa level 0.6-1 units/ml 4hrs after LMWH dose given Monitor platelets by anticoagulation protocol: Yes   Plan:  Continue Lovenox 100 mg SQ q12h - d/c when INR >=2 Coumadin 5 mg PO x1 dose at 1600 Daily INR Monitor CBC and signs/symptoms of bleeding  Thank you for involving pharmacy in this patient's care.  2/18, PharmD PGY2 Infectious Diseases Pharmacy Resident   Please check [email or web address] for unit-specific pharmacy phone numbers

## 2021-09-23 NOTE — Progress Notes (Signed)
NEUROLOGY CONSULTATION PROGRESS NOTE   Date of service: September 23, 2021 Patient Name: Brandon [name] MRN:  [id number] DOB:  10/14/1940   Interval Hx   Much more awake today. BL lower ext weakness is spontaneously improving.  He was able to get up and walk with a walker today.  Still has very little control of the urination and not entirely sure if it is actually improving.  Vitals   Vitals:   09/22/21 2347 09/23/21 0324 09/23/21 0800 09/23/21 1125  BP: 104/61 (!) 109/59 121/70 122/67  Pulse: 70 62 64 71  Resp: 16 16 18 18   Temp: 98.1 F (36.7 C) 98.1 F (36.7 C) 97.7 F (36.5 C) 98.4 F (36.9 C)  TempSrc: Oral Oral Oral Oral  SpO2: 95% 94% 95% 94%     There is no height or weight on file to calculate BMI.  Physical Exam   General: Laying comfortably in bed; in no acute distress.  HENT: Normal oropharynx and mucosa. Normal external appearance of ears and nose.  Neck: Supple, no pain or tenderness  CV: No JVD. No peripheral edema.  Pulmonary: Symmetric Chest rise. Normal respiratory effort.  Abdomen: Soft to touch, non-tender.  Ext: No cyanosis, edema, or deformity  Skin: No rash. Normal palpation of skin.   Musculoskeletal: Normal digits and nails by inspection. No clubbing.   Neurologic Examination  Mental status/Cognition: Alert, oriented to self, place, month and year, good attention.  Speech/language: Fluent, comprehension intact, object naming intact, repetition intact.  Cranial nerves:   CN II Pupils equal and reactive to light, no VF deficits    CN III,IV,VI EOM intact, no gaze preference or deviation, no nystagmus    CN V normal sensation in V1, V2, and V3 segments bilaterally    CN VII no asymmetry, no nasolabial fold flattening    CN VIII normal hearing to speech    CN IX & X normal palatal elevation, no uvular deviation    CN XI 5/5 head turn and 5/5 shoulder shrug bilaterally    CN XII midline tongue protrusion    Motor:  Muscle bulk: normal, tone  normal, pronator drift none tremor none Mvmt Root Nerve  Muscle Right Left Comments  SA C5/6 Ax Deltoid     EF C5/6 Mc Biceps 5 5   EE C6/7/8 Rad Triceps 5 5   WF C6/7 Med FCR     WE C7/8 PIN ECU     F Ab C8/T1 U ADM/FDI 5 5   HF L1/2/3 Fem Illopsoas 4 4   KE L2/3/4 Fem Quad 4+ 5   DF L4/5 D Peron Tib Ant 4+ 4+   PF S1/2 Tibial Grc/Sol 4+ 4+    Reflexes:  Right Left Comments  Pectoralis      Biceps (C5/6) 2 2   Brachioradialis (C5/6) 2 2    Triceps (C6/7) 2 2    Patellar (L3/4) 1 1    Achilles (S1) 1 1    Hoffman      Plantar     Jaw jerk    Sensation:  Light touch Intact throughout   Pin prick    Temperature    Vibration   Proprioception    Coordination/Complex Motor:  - Finger to Nose intact BL - Heel to shin unable to do with RLE, intact with LLE. - Rapid alternating movement are normal - Gait: unsafe to assess given the extent of his weakness.  Labs   Basic Metabolic Panel:  Lab Results  Component Value Date   NA 137 09/23/2021   K 3.6 09/23/2021   CO2 24 09/23/2021   GLUCOSE 141 (H) 09/23/2021   BUN 25 (H) 09/23/2021   CREATININE 1.24 09/23/2021   CALCIUM 9.1 09/23/2021   GFRNONAA 59 (L) 09/23/2021   GFRAA >60 01/02/2019   HbA1c: No results found for: HGBA1C LDL: No results found for: [facility] Urine Drug Screen:     Component Value Date/Time   LABOPIA NONE DETECTED 09/21/2021 0620   COCAINSCRNUR NONE DETECTED 09/21/2021 0620   LABBENZ NONE DETECTED 09/21/2021 0620   AMPHETMU NONE DETECTED 09/21/2021 0620   THCU NONE DETECTED 09/21/2021 0620   LABBARB NONE DETECTED 09/21/2021 0620    Alcohol Level     Component Value Date/Time   ETH <10 09/21/2021 0513   No results found for: PHENYTOIN, ZONISAMIDE, LAMOTRIGINE, LEVETIRACETA No results found for: PHENYTOIN, PHENOBARB, VALPROATE, CBMZ  Imaging and Diagnostic studies   CTH w/o contrast: Stable non contrast CT appearance of the brain. No acute intracranial hemorrhage or acute cortically based  infarct identified.  Repeat CTH:  No acute abnormalities.  CT Angio head and neck: Negative for LVO.  CT cervical, thoracic, lumbar spine without contrast:  Multilevel cervical spondylosis. Multilevel spinal and foraminal stenosis most prominent at C5-6 and C6-7.  1. Mild thoracic kyphosis; spondylosis and degenerative disc disease lead to substantial foraminal impingement at C5-6; moderate bilateral foraminal impingement at C6-7; and mild bilateral foraminal impingement at T8-9. 2. The dorsal column stimulator leads are at the T9 and T10 level.  1. Lumbar spondylosis and degenerative disc disease causing prominent impingement at L3-4 and L4-5; moderate impingement at L5-S1; and mild impingement at L2-3, as detailed above. 2. 4 mm degenerative retrolisthesis at L2-3. 3. Other imaging findings of potential clinical significance: Aortic Atherosclerosis (ICD10-I70.0). Coronary atherosclerosis. Elevated left hemidiaphragm with atelectasis in the left lower lobe along the diaphragm. Pneumobilia. Nonobstructive left nephrolithiasis.   Impression   Brandon [name] is a 81 y.o. male with a history of CHF, diabetes, hyperlipidemia, hypertension, BPH, Hx of PE on warfarin who presents with mild confusion, BL lower ext weakness and edema.  Mentation ahs cleared up. Reports urinary issues for the last month including frequent urination every 20-30 mins. Fall on Thursday followed by confusion and BL lower ext weakness. Was walking with a cane prior to this. Wife feels urination worsened after the fall on Thursday.  Today BL lower ext weakness has significantly improved and can lift both legs off the bed and walk with a walker today.  Still has frequent urination and some concern that this worsened after a fall.  Could potentially be related to BPH or could be a sign of potential cord compression.  However given spontaneous improvement in his BL lower ext weakness, no clear sensory level, no  saddle anesthesia and no bowel incontinence or constipation, I would rather opt to monitor him for improvement rather than proceed with CT myelogram at this time.   Recommendations  - Will re-evaluate his exam for any improvement inlower ext strength and improvement in urinary frequency. - Bit B12 is low normal, will replace Vit B12. - continue PT and OT. - Might need CT Myelogram if urinary frequency does not improve.  ___________________________________________________________________  Plan discussed with dr. Rito Ehrlich and with patient's wife over the phone.  Thank you for the opportunity to take part in the care of this patient. If you have any further questions, please contact the neurology consultation attending.  Signed,  Terrilee Files  Derry Lory [facility] Pager Number [phone]

## 2021-09-23 NOTE — TOC Initial Note (Signed)
Transition of Care [facility]) - Initial/Assessment Note    Patient Details  Name: Brandon [name] MRN: [id number] Date of Birth: 1941-06-21  Transition of [facility]) CM/SW Contact:    Coralee Pesa, [place] Phone Number: 09/23/2021, 10:58 AM  Clinical Narrative:                 CSW met with pt at bedside and discussed SNF recommendation. Pt noted he is agreeable and his wife, Mariann Laster, is already in the process of securing a bed. He requested CSW reach out to her. CSW spoke with Mariann Laster who noted they have been in contact with Olivia Mackie at [facility] in [place], and she stated they may have a semi private bed for him at [place] stated she would send a referral and spouse was appreciative. All questions answered and TOC will follow up with facility tomorrow.  Expected Discharge Plan: Skilled Nursing Facility Barriers to Discharge: Continued Medical Work up, SNF Pending bed offer, Insurance Authorization   Patient Goals and CMS Choice Patient states their goals for this hospitalization and ongoing recovery are:: Pt states he would like to regain independence. CMS Medicare.gov Compare Post Acute Care list provided to:: Patient Choice offered to / list presented to : Patient, Spouse  Expected Discharge Plan and Services Expected Discharge Plan: [place] Acute Care Choice: [place] Living arrangements for the past 2 months: Single Family Home                                      Prior Living Arrangements/Services Living arrangements for the past 2 months: Single Family Home Lives with:: Spouse Patient language and need for interpreter reviewed:: Yes Do you feel safe going back to the place where you live?: Yes      Need for Family Participation in Patient Care: Yes (Comment) Care giver support system in place?: Yes (comment)   Criminal Activity/Legal Involvement Pertinent to Current Situation/Hospitalization: No - Comment as needed  Activities of  Daily Living      Permission Sought/Granted Permission sought to share information with : Family Supports Permission granted to share information with : Yes, Verbal Permission Granted  Share Information with NAME: Mariann Laster     Permission granted to share info w Relationship: Spouse  Permission granted to share info w Contact Information: [phone]  Emotional Assessment Appearance:: Appears stated age Attitude/Demeanor/Rapport: Engaged Affect (typically observed): Pleasant Orientation: : Oriented to Self, Oriented to Place, Oriented to  Time, Oriented to Situation Alcohol / Substance Use: Not Applicable Psych Involvement: No (comment)  Admission diagnosis:  Weakness [R53.1] Encephalopathy [G93.40] Patient Active Problem List   Diagnosis Date Noted   Lower extremity weakness 49/44/9675   Acute metabolic encephalopathy 91/63/8466   Hypertensive urgency 09/21/2021   Trigger middle finger of left hand 02/14/2020   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   Vitamin D deficiency 05/21/2019   COPD with chronic bronchitis ([place]) 12/29/2018   Pulmonary embolism on long-term anticoagulation therapy ([place]) 12/29/2018   DM (diabetes mellitus) ([place]) 12/29/2018   Chronic diastolic CHF (congestive heart failure) ([place]) 12/28/2018   Choledocholithiasis 12/28/2018   CKD (chronic kidney disease) stage 3, GFR 30-59 ml/min ([place]) 07/30/2018   Class 1 obesity due to excess calories with serious comorbidity and body mass index (BMI) of 30.0 to 30.9 in adult 01/07/2018   Benign prostatic hyperplasia with urinary frequency 12/02/2017  Balance disorder 08/22/2017   History of pulmonary embolism 02/19/2017   Hammertoe of right foot 05/16/2016   Callus of foot 05/16/2016   Diabetic polyneuropathy associated with type 2 diabetes mellitus ([place]) 04/16/2016   Onychomycosis due to dermatophyte 04/16/2016   Right foot pain 04/16/2016   Primary osteoarthritis involving multiple joints 04/15/2016    Elevated hemidiaphragm 01/24/2016   Gait abnormality 01/24/2016   Essential hypertension 11/22/2015   Anticoagulant long-term use 11/22/2015   Cervical disc disease 11/22/2015   Gastroesophageal reflux disease 11/22/2015   Gout 11/22/2015   High risk medication use 11/22/2015   Lung mass 11/22/2015   Major depressive disorder with current active episode 11/22/2015   Mixed hyperlipidemia 11/22/2015   Malaise and fatigue 11/22/2015   Testosterone deficiency 11/22/2015   Vitamin B12 deficiency 11/22/2015   Erectile dysfunction 11/22/2015   Edema 11/22/2015   PCP:  Raina Mina., MD Pharmacy:   [facility], [place] - [address] [place] [address] Phone: [phone] Fax: [phone]  [id number] [place] [address] [place] [address]: [phone] Fax: [address] [id number] [place]: [phone]: [phone]    Social Determinants of Health (SDOH) Interventions    Readmission Risk Interventions No flowsheet data found.

## 2021-09-24 DIAGNOSIS — R29898 Other symptoms and signs involving the musculoskeletal system: Secondary | ICD-10-CM | POA: Diagnosis not present

## 2021-09-24 DIAGNOSIS — G9341 Metabolic encephalopathy: Secondary | ICD-10-CM | POA: Diagnosis not present

## 2021-09-24 DIAGNOSIS — E1169 Type 2 diabetes mellitus with other specified complication: Secondary | ICD-10-CM | POA: Diagnosis not present

## 2021-09-24 LAB — CBC
HCT: 41.6 % (ref 39.0–52.0)
Hemoglobin: 13.6 g/dL (ref 13.0–17.0)
MCH: 28.9 pg (ref 26.0–34.0)
MCHC: 32.7 g/dL (ref 30.0–36.0)
MCV: 88.5 fL (ref 80.0–100.0)
Platelets: 204 10*3/uL (ref 150–400)
RBC: 4.7 MIL/uL (ref 4.22–5.81)
RDW: 14.6 % (ref 11.5–15.5)
WBC: 9.8 10*3/uL (ref 4.0–10.5)
nRBC: 0 % (ref 0.0–0.2)

## 2021-09-24 LAB — PROTIME-INR
INR: 1.4 — ABNORMAL HIGH (ref 0.8–1.2)
Prothrombin Time: 17.3 seconds — ABNORMAL HIGH (ref 11.4–15.2)

## 2021-09-24 LAB — HEMOGLOBIN A1C
Hgb A1c MFr Bld: 6.6 % — ABNORMAL HIGH (ref 4.8–5.6)
Mean Plasma Glucose: 143 mg/dL

## 2021-09-24 MED ORDER — POLYETHYLENE GLYCOL 3350 17 G PO PACK
17.0000 g | PACK | Freq: Every day | ORAL | Status: DC
  Administered 2021-09-24: 17 g via ORAL
  Filled 2021-09-24 (×2): qty 1

## 2021-09-24 MED ORDER — SENNOSIDES-DOCUSATE SODIUM 8.6-50 MG PO TABS
2.0000 | ORAL_TABLET | Freq: Two times a day (BID) | ORAL | Status: DC
  Administered 2021-09-25: 11:00:00 2 via ORAL
  Filled 2021-09-24 (×2): qty 2

## 2021-09-24 MED ORDER — OXYBUTYNIN CHLORIDE 5 MG PO TABS
5.0000 mg | ORAL_TABLET | Freq: Two times a day (BID) | ORAL | Status: DC
  Administered 2021-09-24 – 2021-09-25 (×2): 5 mg via ORAL
  Filled 2021-09-24 (×2): qty 1

## 2021-09-24 MED ORDER — WARFARIN SODIUM 7.5 MG PO TABS
7.5000 mg | ORAL_TABLET | Freq: Once | ORAL | Status: AC
  Administered 2021-09-24: 7.5 mg via ORAL
  Filled 2021-09-24: qty 1

## 2021-09-24 NOTE — Progress Notes (Signed)
[facility] PROGRESS NOTE   Brandon [name] [id number] DOB: 07-16-1941 DOA: 09/21/2021  0 DOS: the patient was seen and examined on 09/24/2021  PCP: Raina Mina., MD  Brief History and Hospital Course:  81 y.o. 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  At baseline patient ambulates with the use of a cane and has some memory issues after being on life support for a period in time after having bilateral pulmonary emboli.  Apparently patient had a fall in his yard.  Neighbors helped him back into the house.  Has had urinary frequency and some incontinence as well.  Blood pressure was elevated.  Patient was brought into the emergency department as a code stroke.  Not a tPA candidate as he was out of the window.  Seen by neurology.  Was hospitalized for further evaluation and management.  MRI was ordered but cannot be done because he has spinal stimulator which was not known previously.  Subsequently underwent CT scan of the cervical thoracic and lumbar spine.  Seen by physical and Occupational Therapy.  Skilled nursing facility is recommended for rehab.  Waiting on further input from neurology.   Consultants: Neurology  Procedures: Transthoracic echocardiogram    Subjective: Patient mentions that the weakness in his legs has improved compared to admission but still not back to baseline.  Denies any other complaints   Assessment/Plan:  * Lower extremity weakness- (present on admission) Patient has ambulatory dysfunction at baseline but is able to ambulate with her cane.  He has noticed some increasing weakness in his legs over the past few days.  Patient reports a history of neuropathy previously. Patient underwent CT scan of the cervical thoracic and lumbar spine.  Patient is noted to have evidence  for stenoses but not severe enough to cause his symptoms.  MRI could not be done due to presence of spinal stimulator. Neurology continues to follow.  Metabolic work-up has been initiated. CK, folic acid, [id number] levels satisfactory.  HIV nonreactive.  TSH was normal previously. Seen by PT and OT.  Skilled nursing facility recommended for rehabilitation. Lower extremity strength has improved some.  He was able to ambulate with physical therapy.  Neurology was considering CT myelogram.  We will wait for their input today.  Acute metabolic encephalopathy- (present on admission) Patient apparently noted to be more confused than his usual at the time of admission.  Differential diagnosis as to the reason for the same included hypertensive encephalopathy versus seizure versus stroke or TIA. Infectious work-up was unremarkable with negative UA and negative chest x-ray.  COVID-19 and influenza PCR negative.  CT head was unremarkable.  CT angiogram did not show any large vessel occlusion but atherosclerosis was noted in the intracranial vessels with moderate irregularity and stenosis of the right PCA P1 and P2 segments. MRI cannot be done because he has a spinal stimulator.  CT scan repeated and does not show any acute process either.  Patient mainly concerned by his bilateral lower extremity weakness.  See discussion below. Mentation seems to be back to baseline.  History of pulmonary embolism- (present on admission) On chronic anticoagulation with warfarin.  Currently on Lovenox for bridging.  Coumadin per pharmacy.  Hypertensive urgency- (present on admission) Presented with a significantly high blood pressure of [id number] systolic.  Blood pressure has improved.  Continue to monitor.  Noted  to be on lisinopril currently.  Chronic diastolic CHF (congestive heart failure) ([place])- (present on admission) Was given Lasix x2 at admission.  Now seems to be reasonably well compensated.   Echocardiogram done during  this admission showed normal systolic function.  Grade 1 diastolic dysfunction noted. Holding off on further doses of diuretics.   ACE inhibitor being continued.  Potassium being monitored periodically. Mildly elevated troponin likely of no clinical significance at this time since he does not have any chest discomfort or dyspnea.  DM (diabetes mellitus) ([place]) Not on any medications prior to admission.  HbA1c is pending glucose levels are stable.  No need to check CBGs on more than once daily basis.   Mixed hyperlipidemia- (present on admission) Currently on fenofibrate.  Benign prostatic hyperplasia with urinary frequency- (present on admission) Patient was experiencing urinary incontinence and frequent urination.  His Flomax and finasteride were held.      DVT Prophylaxis: Lovenox Code Status: Full code Family Communication: Discussed with patient.  No family at bedside Disposition Plan: SNF for short-term rehab  Status is: Observation  The patient will require care spanning > 2 midnights and should be moved to inpatient because: Inability to ambulate        Medications: Scheduled:  cyanocobalamin  1,000 mcg Intramuscular Daily   Followed by   Derrill Memo ON 09/28/2021] vitamin B-12  1,000 mcg Oral Daily   DULoxetine  30 mg Oral QPM   enoxaparin (LOVENOX) injection  100 mg Subcutaneous Q12H   fenofibrate  160 mg Oral QHS   lisinopril  10 mg Oral Daily   pantoprazole  20 mg Oral Daily   polyethylene glycol  17 g Oral Daily   senna-docusate  2 tablet Oral BID   sodium chloride flush  3 mL Intravenous Q12H   Warfarin - Pharmacist Dosing Inpatient   Does not apply q1600   Continuous: [id number] **OR** acetaminophen, albuterol, ondansetron **OR** ondansetron (ZOFRAN) IV, oxyCODONE-acetaminophen **AND** oxyCODONE  Antibiotics: Anti-infectives (From admission, onward)    None       Objective:  Vital Signs  Vitals:   09/23/21 1948 09/23/21 2315 09/24/21 0320  09/24/21 0820  BP: 117/60 109/70 115/60 131/72  Pulse: 76 73 68 65  Resp: 18 18 18 16   Temp: 98.7 F (37.1 C) 98.3 F (36.8 C) 98.6 F (37 C) 98.1 F (36.7 C)  TempSrc: Oral Oral Oral Oral  SpO2: 93% 94% 95% 94%    Intake/Output Summary (Last 24 hours) at 09/24/2021 1054 Last data filed at 09/24/2021 0900 Gross per 24 hour  Intake 1040 ml  Output 1400 ml  Net -360 ml    There were no vitals filed for this visit.   General appearance: Awake alert.  In no distress Resp: Clear to auscultation bilaterally.  Normal effort Cardio: S1-S2 is normal regular.  No S3-S4.  No rubs murmurs or bruit GI: Abdomen is soft.  Nontender nondistended.  Bowel sounds are present normal.  No masses organomegaly Extremities: No edema.  Improved movement of the lower extremities noted. Neurologic: Alert and oriented x3.  No focal neurological deficits.     Lab Results:  Data Reviewed: I have personally reviewed labs and imaging study reports  CBC: Recent Labs  Lab 09/21/21 0513 09/21/21 0523 09/22/21 0139 09/23/21 0314 09/24/21 0346  WBC 9.9  --  9.3 9.0 9.8  NEUTROABS 6.3  --   --   --   --   HGB 13.8 15.0 13.2 13.0 13.6  HCT 43.7 44.0 40.7  39.9 41.6  MCV 90.9  --  87.9 87.9 88.5  PLT 181  --  193 184 204     Basic Metabolic Panel: Recent Labs  Lab 09/21/21 0513 09/21/21 0523 09/22/21 0139 09/23/21 0314  NA 139 139 135 137  K 4.3 4.2 3.1* 3.6  CL 105 104 104 105  CO2 23  --  24 24  GLUCOSE 128* 127* 132* 141*  BUN 9 12 15  25*  CREATININE 0.96 0.80 1.03 1.24  CALCIUM 9.3  --  9.0 9.1  MG  --   --   --  1.9     GFR: CrCl cannot be calculated (Unknown ideal weight.).  Liver Function Tests: Recent Labs  Lab 09/21/21 0513 09/23/21 0314  AST 31 23  ALT 19 19  ALKPHOS 71 69  BILITOT 1.1 0.6  PROT 7.2 6.6  ALBUMIN 3.8 3.3*      Coagulation Profile: Recent Labs  Lab 09/21/21 0513 09/22/21 0139 09/23/21 0314 09/24/21 0346  INR 1.6* 1.4* 1.5* 1.4*       CBG: Recent Labs  Lab 09/21/21 0514  GLUCAP 125*      Thyroid Function Tests: Recent Labs    09/21/21 1624  TSH 0.478      Recent Results (from the past 240 hour(s))  Resp Panel by RT-PCR (Flu A&B, Covid) Nasopharyngeal Swab     Status: None   Collection Time: 09/21/21  5:13 AM   Specimen: Nasopharyngeal Swab; Nasopharyngeal(NP) swabs in vial transport medium  Result Value Ref Range Status   SARS Coronavirus 2 by RT PCR NEGATIVE NEGATIVE Final    Comment: (NOTE) SARS-CoV-2 target nucleic acids are NOT DETECTED.  The SARS-CoV-2 RNA is generally detectable in upper respiratory specimens during the acute phase of infection. The lowest concentration of SARS-CoV-2 viral copies this assay can detect is 138 copies/mL. A negative result does not preclude SARS-Cov-2 infection and should not be used as the sole basis for treatment or other patient management decisions. A negative result may occur with  improper specimen collection/handling, submission of specimen other than nasopharyngeal swab, presence of viral mutation(s) within the areas targeted by this assay, and inadequate number of viral copies(<138 copies/mL). A negative result must be combined with clinical observations, patient history, and epidemiological information. The expected result is Negative.  Fact Sheet for Patients:  EntrepreneurPulse.com.au  Fact Sheet for Healthcare Providers:  IncredibleEmployment.be  This test is no t yet approved or cleared by the [place] FDA and  has been authorized for detection and/or diagnosis of SARS-CoV-2 by FDA under an Emergency Use Authorization (EUA). This EUA will remain  in effect (meaning this test can be used) for the duration of the COVID-19 declaration under Section 564(b)(1) of the Act, 21 U.S.C.section 360bbb-3(b)(1), unless the authorization is terminated  or revoked sooner.       Influenza A by PCR NEGATIVE  NEGATIVE Final   Influenza B by PCR NEGATIVE NEGATIVE Final    Comment: (NOTE) The Xpert Xpress SARS-CoV-2/FLU/RSV plus assay is intended as an aid in the diagnosis of influenza from Nasopharyngeal swab specimens and should not be used as a sole basis for treatment. Nasal washings and aspirates are unacceptable for Xpert Xpress SARS-CoV-2/FLU/RSV testing.  Fact Sheet for Patients: EntrepreneurPulse.com.au  Fact Sheet for Healthcare Providers: IncredibleEmployment.be  This test is not yet approved or cleared by the [place] FDA and has been authorized for detection and/or diagnosis of SARS-CoV-2 by FDA under an Emergency Use Authorization (EUA). This EUA will  remain in effect (meaning this test can be used) for the duration of the COVID-19 declaration under Section 564(b)(1) of the Act, 21 U.S.C. section 360bbb-3(b)(1), unless the authorization is terminated or revoked.  Performed at [facility], [place] [address], [place], [place] [address]   Urine Culture     Status: Abnormal   Collection Time: 09/21/21  6:20 AM   Specimen: Urine, Clean Catch  Result Value Ref Range Status   Specimen Description URINE, CLEAN CATCH  Final   Special Requests NONE  Final   Culture (A)  Final    <10,000 COLONIES/mL INSIGNIFICANT GROWTH Performed at [facility], [place] [address], [place], [place] [address]    Report Status 09/22/2021 FINAL  Final       Radiology Studies: CT CERVICAL SPINE WO CONTRAST  Result Date: 09/22/2021 CLINICAL DATA:  Back pain.  Bilateral lower extremity weakness. EXAM: CT CERVICAL SPINE WITHOUT CONTRAST TECHNIQUE: Multidetector CT imaging of the cervical spine was performed without intravenous contrast. Multiplanar CT image reconstructions were also generated. RADIATION DOSE REDUCTION: This exam was performed according to the departmental dose-optimization program which includes automated exposure control, adjustment of  the mA and/or kV according to patient size and/or use of iterative reconstruction technique. COMPARISON:  CT cervical spine 09/19/2015 FINDINGS: Alignment: Mild anterolisthesis C4-5 Skull base and vertebrae: Negative for fracture or mass Soft tissues and spinal canal: Negative for soft tissue mass or edema. Mild atherosclerotic calcification carotid bifurcation bilaterally. Disc levels: C2-3: Disc degeneration. Left facet degeneration. No significant stenosis C3-4: Disc degeneration with diffuse uncinate spurring. Severe facet hypertrophy on the left and mild facet hypertrophy on the right. Severe left foraminal stenosis. Mild spinal stenosis. Mild right foraminal stenosis C4-5: Moderate facet hypertrophy bilaterally. Mild disc degeneration. Mild foraminal narrowing bilaterally. C5-6: Disc degeneration with diffuse uncinate spurring. Mild spinal stenosis. Severe foraminal encroachment bilaterally due to spurring. C6-7: Disc degeneration with diffuse uncinate spurring. Severe foraminal encroachment bilaterally. Mild spinal stenosis C7-T1: Bilateral facet degeneration. Mild foraminal narrowing bilaterally. Upper chest: Lung apices clear bilaterally. Other: None IMPRESSION: Multilevel cervical spondylosis. Multilevel spinal and foraminal stenosis most prominent at C5-6 and C6-7. Electronically Signed   By: Franchot Gallo M.D.   On: 09/22/2021 15:32   CT THORACIC SPINE WO CONTRAST  Result Date: 09/22/2021 CLINICAL DATA:  Mid and lower back pain EXAM: CT THORACIC AND LUMBAR SPINE WITHOUT CONTRAST TECHNIQUE: Multidetector CT imaging of the thoracic and lumbar spine was performed without contrast. Multiplanar CT image reconstructions were also generated. RADIATION DOSE REDUCTION: This exam was performed according to the departmental dose-optimization program which includes automated exposure control, adjustment of the mA and/or kV according to patient size and/or use of iterative reconstruction technique. COMPARISON:   Multiple exams, including CT abdomen 02/01/2020 and CT chest 05/12/2013 FINDINGS: CT THORACIC SPINE FINDINGS Alignment: Mild thoracic kyphosis. No subluxation in the thoracic spine. Vertebrae: Interbody fusion at C6-7. No thoracic spine fracture or acute bony findings identified. Paraspinal and other soft tissues: Coronary, aortic arch, and branch vessel atherosclerotic vascular disease. Elevated left hemidiaphragm. Old granulomatous disease involving the lungs and spleen. 0.8 cm prevascular lymph node on image 56 series 5. Atelectasis along the left hemidiaphragm. Pneumobilia. Disc levels: C5-6: Prominent bilateral foraminal impingement due to uncinate spurring. C6-7: Moderate bilateral foraminal stenosis due to spurring along the intervertebral fusion site. C7-T1: No impingement.  Degenerative facet arthropathy. T1-2: No impingement.  Degenerative left facet arthropathy. T2-3: Unremarkable. T3-4: Unremarkable. T4-5: Unremarkable. T5-6: Unremarkable. T6-7: Unremarkable. T7-8: Unremarkable.  T8-9: Mild bilateral foraminal stenosis due to degenerative facet arthropathy. T9-10: Unremarkable. The lead to the dorsal column stimulator are at the T9 and T10 level. T10-11: Unremarkable. T11-12: Unremarkable. CT LUMBAR SPINE FINDINGS Segmentation: The lowest lumbar type non-rib-bearing vertebra is labeled as L5. Alignment: 4 mm degenerative retrolisthesis at L2-3. Mild levoconvex lumbar scoliosis with rotary component. Vertebrae: No fracture or acute bony findings. Mild bridging spurring of the sacroiliac joints. Loss of disc height at the L2-3 level. Paraspinal and other soft tissues: Atherosclerosis is present, including aortoiliac atherosclerotic disease. Nonobstructive left nephrolithiasis. Disc levels: T12-L1: Unremarkable. L1-2: Unremarkable. L2-3: Mild bilateral foraminal stenosis due to intervertebral spurring and degenerative facet arthropathy. L3-4: Suspected prominent central narrowing of the thecal sac and mild  bilateral foraminal stenosis due to disc bulge, facet arthropathy, ligamentum flavum redundancy, and intervertebral spurring. L4-5: Suspected prominent central narrowing of the thecal sac and mild bilateral foraminal stenosis again due to facet arthropathy, ligamentum flavum redundancy, disc bulge, and spurring. L5-S1: Moderate left and mild right foraminal stenosis due to facet spurring. IMPRESSION: CT THORACIC SPINE IMPRESSION 1. Mild thoracic kyphosis; spondylosis and degenerative disc disease lead to substantial foraminal impingement at C5-6; moderate bilateral foraminal impingement at C6-7; and mild bilateral foraminal impingement at T8-9. 2. The dorsal column stimulator leads are at the T9 and T10 level. CT LUMBAR SPINE IMPRESSION 1. Lumbar spondylosis and degenerative disc disease causing prominent impingement at L3-4 and L4-5; moderate impingement at L5-S1; and mild impingement at L2-3, as detailed above. 2. 4 mm degenerative retrolisthesis at L2-3. 3. Other imaging findings of potential clinical significance: Aortic Atherosclerosis (ICD10-I70.0). Coronary atherosclerosis. Elevated left hemidiaphragm with atelectasis in the left lower lobe along the diaphragm. Pneumobilia. Nonobstructive left nephrolithiasis. Electronically Signed   By: Van Clines M.D.   On: 09/22/2021 14:57   CT LUMBAR SPINE WO CONTRAST  Result Date: 09/22/2021 CLINICAL DATA:  Mid and lower back pain EXAM: CT THORACIC AND LUMBAR SPINE WITHOUT CONTRAST TECHNIQUE: Multidetector CT imaging of the thoracic and lumbar spine was performed without contrast. Multiplanar CT image reconstructions were also generated. RADIATION DOSE REDUCTION: This exam was performed according to the departmental dose-optimization program which includes automated exposure control, adjustment of the mA and/or kV according to patient size and/or use of iterative reconstruction technique. COMPARISON:  Multiple exams, including CT abdomen 02/01/2020 and CT  chest 05/12/2013 FINDINGS: CT THORACIC SPINE FINDINGS Alignment: Mild thoracic kyphosis. No subluxation in the thoracic spine. Vertebrae: Interbody fusion at C6-7. No thoracic spine fracture or acute bony findings identified. Paraspinal and other soft tissues: Coronary, aortic arch, and branch vessel atherosclerotic vascular disease. Elevated left hemidiaphragm. Old granulomatous disease involving the lungs and spleen. 0.8 cm prevascular lymph node on image 56 series 5. Atelectasis along the left hemidiaphragm. Pneumobilia. Disc levels: C5-6: Prominent bilateral foraminal impingement due to uncinate spurring. C6-7: Moderate bilateral foraminal stenosis due to spurring along the intervertebral fusion site. C7-T1: No impingement.  Degenerative facet arthropathy. T1-2: No impingement.  Degenerative left facet arthropathy. T2-3: Unremarkable. T3-4: Unremarkable. T4-5: Unremarkable. T5-6: Unremarkable. T6-7: Unremarkable. T7-8: Unremarkable. T8-9: Mild bilateral foraminal stenosis due to degenerative facet arthropathy. T9-10: Unremarkable. The lead to the dorsal column stimulator are at the T9 and T10 level. T10-11: Unremarkable. T11-12: Unremarkable. CT LUMBAR SPINE FINDINGS Segmentation: The lowest lumbar type non-rib-bearing vertebra is labeled as L5. Alignment: 4 mm degenerative retrolisthesis at L2-3. Mild levoconvex lumbar scoliosis with rotary component. Vertebrae: No fracture or acute bony findings. Mild bridging spurring of the sacroiliac joints. Loss of disc  height at the L2-3 level. Paraspinal and other soft tissues: Atherosclerosis is present, including aortoiliac atherosclerotic disease. Nonobstructive left nephrolithiasis. Disc levels: T12-L1: Unremarkable. L1-2: Unremarkable. L2-3: Mild bilateral foraminal stenosis due to intervertebral spurring and degenerative facet arthropathy. L3-4: Suspected prominent central narrowing of the thecal sac and mild bilateral foraminal stenosis due to disc bulge, facet  arthropathy, ligamentum flavum redundancy, and intervertebral spurring. L4-5: Suspected prominent central narrowing of the thecal sac and mild bilateral foraminal stenosis again due to facet arthropathy, ligamentum flavum redundancy, disc bulge, and spurring. L5-S1: Moderate left and mild right foraminal stenosis due to facet spurring. IMPRESSION: CT THORACIC SPINE IMPRESSION 1. Mild thoracic kyphosis; spondylosis and degenerative disc disease lead to substantial foraminal impingement at C5-6; moderate bilateral foraminal impingement at C6-7; and mild bilateral foraminal impingement at T8-9. 2. The dorsal column stimulator leads are at the T9 and T10 level. CT LUMBAR SPINE IMPRESSION 1. Lumbar spondylosis and degenerative disc disease causing prominent impingement at L3-4 and L4-5; moderate impingement at L5-S1; and mild impingement at L2-3, as detailed above. 2. 4 mm degenerative retrolisthesis at L2-3. 3. Other imaging findings of potential clinical significance: Aortic Atherosclerosis (ICD10-I70.0). Coronary atherosclerosis. Elevated left hemidiaphragm with atelectasis in the left lower lobe along the diaphragm. Pneumobilia. Nonobstructive left nephrolithiasis. Electronically Signed   By: Van Clines M.D.   On: 09/22/2021 14:57       LOS: 0 days   [facility] Pager on [email or web address]  09/24/2021, 10:54 AM

## 2021-09-24 NOTE — Care Management Obs Status (Signed)
MEDICARE OBSERVATION STATUS NOTIFICATION   Patient Details  Name: Brandon [name] MRN: [id number] Date of Birth: 13-Apr-1941   Medicare Observation Status Notification Given:  Yes    Baldemar Lenis, LCSW 09/24/2021, 2:44 PM

## 2021-09-24 NOTE — Progress Notes (Signed)
ANTICOAGULATION CONSULT NOTE - Follow Up Consult  Pharmacy Consult for warfarin and enoxaparin Indication: pulmonary embolus history  Allergies  Allergen Reactions   Codeine Hives   Statins     Cramps (ALLERGY/intolerance)   Allopurinol Rash    Unknown   Doxycycline Rash    Unknown    Patient Measurements:   Ht: 6'4" Wt 100 kg  Vital Signs: Temp: 97.7 F (36.5 C) (01/23 1105) Temp Source: Oral (01/23 1105) BP: 120/73 (01/23 1105) Pulse Rate: 74 (01/23 1105)  Labs: Recent Labs    09/22/21 0139 09/22/21 1442 09/23/21 0314 09/24/21 0346  HGB 13.2  --  13.0 13.6  HCT 40.7  --  39.9 41.6  PLT 193  --  184 204  LABPROT 17.4*  --  17.7* 17.3*  INR 1.4*  --  1.5* 1.4*  CREATININE 1.03  --  1.24  --   CKTOTAL  --  153  --   --     CrCl cannot be calculated (Unknown ideal weight.).   Medications:  Scheduled:   cyanocobalamin  1,000 mcg Intramuscular Daily   Followed by   Derrill Memo ON 09/28/2021] vitamin B-12  1,000 mcg Oral Daily   DULoxetine  30 mg Oral QPM   enoxaparin (LOVENOX) injection  100 mg Subcutaneous Q12H   fenofibrate  160 mg Oral QHS   lisinopril  10 mg Oral Daily   pantoprazole  20 mg Oral Daily   polyethylene glycol  17 g Oral Daily   senna-docusate  2 tablet Oral BID   sodium chloride flush  3 mL Intravenous Q12H   Warfarin - Pharmacist Dosing Inpatient   Does not apply q1600    Assessment: 81 yo M on anticoagulation PTA for hx of PE.  Pt presented with subtherapeutic INR and bridging with enoxaparin until INR therapeutic.  INR remains subtherapeutic with little change from admission.  Will increase warfarin dose.  Warfarin PTA dose = 5mg  daily  Goal of Therapy:  INR 2-3 Anti-Xa level 0.6-1 units/ml 4hrs after LMWH dose given Monitor platelets by anticoagulation protocol: Yes   Plan:  Continue Lovenox 100mg  SQ q12h Warfarin 7.5mg  PO x 1  Rebakah Cokley, Pharm.D., BCPS Clinical Pharmacist Clinical phone for 09/24/2021 from 7:30-3:00 is  [phone].  **Pharmacist phone directory can be found on [email or web address] listed under [email or web address].  09/24/2021 12:52 PM

## 2021-09-24 NOTE — Progress Notes (Signed)
Physical Therapy Treatment Patient Details Name: Brandon [name] MRN: [id number] DOB: [birth year] Today's Date: 09/24/2021   History of Present Illness 81 y/o male presented to ED on 09/21/21 for confusion, weakness, and slurred speech. CT head negative. CT spine showed impingement at L3-S1, C5-6, and T8-9. PMH: HTN, diastolic CHF, hx of bilateral PE, CAD, DM    PT Comments    Pt with improved ambulation tolerance today compared to previous sessions. Pt remains to require use of RW and continues to have c/o bilat LE pain with wbing and ambulation. Pt also with c/o low back pain as well today. Acute PT to cont to follow to progress indep with mobility.   Recommendations for follow up therapy are one component of a multi-disciplinary discharge planning process, led by the attending physician.  Recommendations may be updated based on patient status, additional functional criteria and insurance authorization.  Follow Up Recommendations  Skilled nursing-short term rehab (<3 hours/day)     Assistance Recommended at Discharge Frequent or constant Supervision/Assistance  Patient can return home with the following A little help with walking and/or transfers;A lot of help with bathing/dressing/bathroom;Assistance with cooking/housework;Assistance with feeding;Direct supervision/assist for medications management;Direct supervision/assist for financial management;Assist for transportation;Help with stairs or ramp for entrance   Equipment Recommendations  Rolling walker (2 wheels)    Recommendations for Other Services       Precautions / Restrictions Precautions Precautions: Fall Restrictions Weight Bearing Restrictions: No     Mobility  Bed Mobility Overal bed mobility: Needs Assistance Bed Mobility: Supine to Sit     Supine to sit: Min assist     General bed mobility comments: increased time, HOB elevated, minA for trunk elevation, pt used bilat UEs to aide LEs off EOB     Transfers Overall transfer level: Needs assistance Equipment used: Rolling walker (2 wheels) Transfers: Sit to/from Stand Sit to Stand: From elevated surface, Mod assist           General transfer comment: modAx1 to power up from EOB and to steady during transition of hands    Ambulation/Gait Ambulation/Gait assistance: Min assist Gait Distance (Feet): 100 Feet Assistive device: Rolling walker (2 wheels) Gait Pattern/deviations: Step-through pattern, Decreased stride length, Trunk flexed Gait velocity: decreased Gait velocity interpretation: <1.31 ft/sec, indicative of household ambulator   General Gait Details: verbal cues to stay in walker, look forward not down and stand up in full upright position. Pt with noted incresaed bilat UE dependency. Pt with report of onset of bilat LE pain but not as bad as previous sessions   Stairs             Wheelchair Mobility    Modified Rankin (Stroke Patients Only)       Balance Overall balance assessment: Needs assistance Sitting-balance support: Bilateral upper extremity supported, Feet supported Sitting balance-Leahy Scale: Fair Sitting balance - Comments: pt with anterior bias, kyphotic, and forward head resting elbows on knees Postural control: Posterior lean Standing balance support: Bilateral upper extremity supported, During functional activity, Reliant on assistive device for balance Standing balance-Leahy Scale: Poor                              Cognition Arousal/Alertness: Awake/alert Behavior During Therapy: WFL for tasks assessed/performed Overall Cognitive Status: Within Functional Limits for tasks assessed  General Comments: pt flat, mildly depressed spirts asking " what's there plan, no one has told me anything."        Exercises      General Comments General comments (skin integrity, edema, etc.): VSS on RA, noted bilat LE edema       Pertinent Vitals/Pain Pain Assessment Pain Assessment: Faces Faces Pain Scale: Hurts little more Pain Location: bilater LEs from thighs down with walking, chronic low back pain Pain Descriptors / Indicators: Discomfort, Dull Pain Intervention(s): Monitored during session    Home Living                          Prior Function            PT Goals (current goals can now be found in the care plan section) Acute Rehab PT Goals Patient Stated Goal: to go to rehab and get stronger PT Goal Formulation: With patient/family Time For Goal Achievement: 10/06/21 Potential to Achieve Goals: Fair Progress towards PT goals: Progressing toward goals    Frequency    Min 3X/week      PT Plan Current plan remains appropriate    Co-evaluation              AM-PAC PT "6 Clicks" Mobility   Outcome Measure  Help needed turning from your back to your side while in a flat bed without using bedrails?: A Little Help needed moving from lying on your back to sitting on the side of a flat bed without using bedrails?: A Little Help needed moving to and from a bed to a chair (including a wheelchair)?: A Little Help needed standing up from a chair using your arms (e.g., wheelchair or bedside chair)?: A Lot Help needed to walk in hospital room?: A Lot Help needed climbing 3-5 steps with a railing? : A Lot 6 Click Score: 15    End of Session Equipment Utilized During Treatment: Gait belt Activity Tolerance: Patient tolerated treatment well Patient left: with call bell/phone within reach;in chair;with chair alarm set Nurse Communication: Mobility status PT Visit Diagnosis: Unsteadiness on feet (R26.81);Muscle weakness (generalized) (M62.81);History of falling (Z91.81);Difficulty in walking, not elsewhere classified (R26.2);Other symptoms and signs involving the nervous system (Q25.956)     Time: 3875-6433 PT Time Calculation (min) (ACUTE ONLY): 24 min  Charges:  $Gait Training:  23-37 mins                     Lewis Shock, PT, DPT Acute Rehabilitation Services Pager #: [phone] Office #: [phone]    Iona Hansen 09/24/2021, 1:26 PM

## 2021-09-24 NOTE — TOC Progression Note (Signed)
Transition of Care [facility]) - Progression Note    Patient Details  Name: Brandon [name] MRN: [id number] Date of Birth: Sep 21, 1940  [facility] of [facility]) CM/SW [place], [place] Phone Number: 09/24/2021, 12:01 PM  Clinical Narrative:   CSW spoke with [facility] Admissions and confirmed bed offer and availability at [facility]. Patient's insurance is managed through [facility], so [facility] will initiate. CSW sent MD information for auth to be initiated, awaiting auth determination and medical stability for DC to SNF.     Expected Discharge Plan: [place] Barriers to Discharge: Continued Medical Work up, Ship broker  Expected Discharge Plan and Services Expected Discharge Plan: [place] Choice: [place] arrangements for the past 2 months: Single Family Home                                       Social Determinants of Health (SDOH) Interventions    Readmission Risk Interventions No flowsheet data found.

## 2021-09-24 NOTE — Progress Notes (Signed)
NEUROLOGY CONSULTATION PROGRESS NOTE   Date of service: September 24, 2021 Patient Name: Brandon [name] MRN:  [id number] DOB:  Jun 04, 1941   Interval Hx  Bladder incontinence persistent Per PT, ambulation better today. Continues to report leg weakness-subjectively not much improved   Vitals   Vitals:   09/23/21 2315 09/24/21 0320 09/24/21 0820 09/24/21 1105  BP: 109/70 115/60 131/72 120/73  Pulse: 73 68 65 74  Resp: 18 18 16 20   Temp: 98.3 F (36.8 C) 98.6 F (37 C) 98.1 F (36.7 C) 97.7 F (36.5 C)  TempSrc: Oral Oral Oral Oral  SpO2: 94% 95% 94% 95%   Physical Exam   General: Laying comfortably in bed; in no acute distress.  HENT: Normal oropharynx and mucosa. Normal external appearance of ears and nose.  Neck: Supple, no pain or tenderness  CV: No JVD. No peripheral edema.  Pulmonary: Symmetric Chest rise. Normal respiratory effort.  Abdomen: Soft to touch, non-tender.  Ext: No cyanosis, edema, or deformity  Skin: No rash. Normal palpation of skin.   Musculoskeletal: Normal digits and nails by inspection. No clubbing.   Neurologic Examination  Mental status/Cognition: Alert, oriented to self, place, month and year, good attention.  Speech/language: Fluent, comprehension intact, object naming intact, repetition intact.  Cranial nerves:   CN II Pupils equal and reactive to light, no VF deficits    CN III,IV,VI EOM intact, no gaze preference or deviation, no nystagmus    CN V normal sensation in V1, V2, and V3 segments bilaterally    CN VII no asymmetry, no nasolabial fold flattening    CN VIII Mildly reduced hearing to speech   CN IX & X normal palatal elevation, no uvular deviation    CN XI 5/5 head turn and 5/5 shoulder shrug bilaterally    CN XII midline tongue protrusion    Motor:  Muscle bulk: normal, tone normal, pronator drift none tremor none Mvmt Root Nerve  Muscle Right Left Comments  SA C5/6 Ax Deltoid     EF C5/6 Mc Biceps 5 5   EE C6/7/8 Rad  Triceps 5 5   WF C6/7 Med FCR     WE C7/8 PIN ECU     F Ab C8/T1 U ADM/FDI 5 5   HF L1/2/3 Fem Illopsoas 4 4   KE L2/3/4 Fem Quad 4+ 5   DF L4/5 D Peron Tib Ant 4+ 4+   PF S1/2 Tibial Grc/Sol 4+ 4+    Reflexes:  Right Left Comments  Pectoralis      Biceps (C5/6) 2 2   Brachioradialis (C5/6) 2 2    Triceps (C6/7) 2 2    Patellar (L3/4) 1 1    Achilles (S1) 1 1    Hoffman      Plantar Mute Mute   Jaw jerk    Sensation: Intact to light touch Coordination/Complex Motor:  - Finger to Nose intact BL - Heel to shin unable to do with RLE, intact with LLE. - Rapid alternating movement are normal - Gait: unsafe to assess given the extent of his weakness.  Labs   Basic Metabolic Panel:  Lab Results  Component Value Date   NA 137 09/23/2021   K 3.6 09/23/2021   CO2 24 09/23/2021   GLUCOSE 141 (H) 09/23/2021   BUN 25 (H) 09/23/2021   CREATININE 1.24 09/23/2021   CALCIUM 9.1 09/23/2021   GFRNONAA 59 (L) 09/23/2021   GFRAA >60 01/02/2019   HbA1c:  Lab Results  Component Value  Date   HGBA1C 6.6 (H) 09/23/2021   Urine Drug Screen:     Component Value Date/Time   LABOPIA NONE DETECTED 09/21/2021 0620   COCAINSCRNUR NONE DETECTED 09/21/2021 0620   LABBENZ NONE DETECTED 09/21/2021 0620   AMPHETMU NONE DETECTED 09/21/2021 0620   THCU NONE DETECTED 09/21/2021 0620   LABBARB NONE DETECTED 09/21/2021 0620    Alcohol Level     Component Value Date/Time   ETH <10 09/21/2021 0513   Imaging and Diagnostic studies   CTH w/o contrast: Stable non contrast CT appearance of the brain. No acute intracranial hemorrhage or acute cortically based infarct identified.  Repeat CTH:  No acute abnormalities.  CT Angio head and neck: Negative for LVO.  CT cervical, thoracic, lumbar spine without contrast:  Multilevel cervical spondylosis. Multilevel spinal and foraminal stenosis most prominent at C5-6 and C6-7.  1. Mild thoracic kyphosis; spondylosis and degenerative disc  disease lead to substantial foraminal impingement at C5-6; moderate bilateral foraminal impingement at C6-7; and mild bilateral foraminal impingement at T8-9. 2. The dorsal column stimulator leads are at the T9 and T10 level.  1. Lumbar spondylosis and degenerative disc disease causing prominent impingement at L3-4 and L4-5; moderate impingement at L5-S1; and mild impingement at L2-3, as detailed above. 2. 4 mm degenerative retrolisthesis at L2-3. 3. Other imaging findings of potential clinical significance: Aortic Atherosclerosis (ICD10-I70.0). Coronary atherosclerosis. Elevated left hemidiaphragm with atelectasis in the left lower lobe along the diaphragm. Pneumobilia. Nonobstructive left nephrolithiasis.   Impression   Brandon [name] is a 81 y.o. male with a history of CHF, diabetes, hyperlipidemia, hypertension, BPH, Hx of PE on warfarin who presents with mild confusion, BL lower ext weakness and edema.  Mentation ahs cleared up. Reports urinary issues for the last month including frequent urination every 20-30 mins. Fall on Thursday followed by confusion and BL lower ext weakness. Was walking with a cane prior to this. Wife feels urination worsened after the fall on Thursday.  Today BL lower ext weakness has significantly improved and can lift both legs off the bed and walk with a walker today-walked 100 ft with PT (walker).  Still has incontinence of urine and some concern that this worsened after a fall.   Could potentially be related to BPH or could be a sign of potential cord compression.  For this further imaging of spine with CT was done. There is multilevel lumbar spondylosis and degenerative disc disease causing multilevel impingement as well as 4 mm degenerative retrolisthesis L2-3. I ran the scans with the on-call neurosurgeon-no emergent intervention-may benefit from outpatient neurosurgical consultation for possible removal of his spinal stimulator   Recommendations    Medical management per primary team as you are Continue B12 repletion CT myelogram not likely to provide additional information and the risk of stopping Coumadin and heparinizing not worth doing at this time. Outpatient neurosurgical consultation for lumbar spinal stenosis as well as possible removal of the spinal stimulator which the patient says does not benefit him much PT OT Discussed my plan with Dr. Rito Ehrlich. Inpatient neurology will be available as needed.  Please call with questions. -- Milon Dikes, MD Neurologist [facility] Pager: [phone]

## 2021-09-25 DIAGNOSIS — R29898 Other symptoms and signs involving the musculoskeletal system: Secondary | ICD-10-CM | POA: Diagnosis not present

## 2021-09-25 LAB — CBC
HCT: 40.9 % (ref 39.0–52.0)
Hemoglobin: 13.5 g/dL (ref 13.0–17.0)
MCH: 29.2 pg (ref 26.0–34.0)
MCHC: 33 g/dL (ref 30.0–36.0)
MCV: 88.3 fL (ref 80.0–100.0)
Platelets: 207 10*3/uL (ref 150–400)
RBC: 4.63 MIL/uL (ref 4.22–5.81)
RDW: 14.6 % (ref 11.5–15.5)
WBC: 9.4 10*3/uL (ref 4.0–10.5)
nRBC: 0 % (ref 0.0–0.2)

## 2021-09-25 LAB — VITAMIN B6: Vitamin B6: 4.4 ug/L (ref 3.4–65.2)

## 2021-09-25 LAB — PROTIME-INR
INR: 1.6 — ABNORMAL HIGH (ref 0.8–1.2)
Prothrombin Time: 19.3 seconds — ABNORMAL HIGH (ref 11.4–15.2)

## 2021-09-25 LAB — BASIC METABOLIC PANEL
Anion gap: 7 (ref 5–15)
BUN: 23 mg/dL (ref 8–23)
CO2: 23 mmol/L (ref 22–32)
Calcium: 9.2 mg/dL (ref 8.9–10.3)
Chloride: 106 mmol/L (ref 98–111)
Creatinine, Ser: 1.13 mg/dL (ref 0.61–1.24)
GFR, Estimated: >60 mL/min (ref >60)
Glucose, Bld: 128 mg/dL — ABNORMAL HIGH (ref 70–99)
Potassium: 4.1 mmol/L (ref 3.5–5.1)
Sodium: 136 mmol/L (ref 135–145)

## 2021-09-25 LAB — MAGNESIUM: Magnesium: 1.9 mg/dL (ref 1.7–2.4)

## 2021-09-25 LAB — VITAMIN B1: Vitamin B1 (Thiamine): 138.8 nmol/L (ref 66.5–200.0)

## 2021-09-25 MED ORDER — POLYETHYLENE GLYCOL 3350 17 G PO PACK: 17.0000 g | PACK | Freq: Every day | ORAL | 0 refills | Status: AC

## 2021-09-25 MED ORDER — SENNOSIDES-DOCUSATE SODIUM 8.6-50 MG PO TABS: 2.0000 | ORAL_TABLET | Freq: Two times a day (BID) | ORAL | Status: AC

## 2021-09-25 MED ORDER — ENOXAPARIN SODIUM 100 MG/ML IJ SOSY: 100.0000 mg | PREFILLED_SYRINGE | Freq: Two times a day (BID) | INTRAMUSCULAR | Status: DC

## 2021-09-25 MED ORDER — CYANOCOBALAMIN 1000 MCG PO TABS: 1000.0000 ug | ORAL_TABLET | Freq: Every day | ORAL | Status: AC

## 2021-09-25 MED ORDER — OXYCODONE-ACETAMINOPHEN 5-325 MG PO TABS: 1.0000 | ORAL_TABLET | Freq: Four times a day (QID) | ORAL | 0 refills | Status: DC | PRN

## 2021-09-25 MED ORDER — OXYBUTYNIN CHLORIDE 5 MG PO TABS: 5.0000 mg | ORAL_TABLET | Freq: Two times a day (BID) | ORAL | Status: AC

## 2021-09-25 NOTE — TOC Transition Note (Signed)
Transition of Care [facility]) - CM/SW Discharge Note   Patient Details  Name: Brandon [name] MRN: [id number] Date of Birth: 22-Dec-1940  [facility]) CM/SW Contact:  Baldemar Lenis, LCSW Phone Number: 09/25/2021, 10:38 AM   Clinical Narrative:   Nurse to call report to [phone], Ext. [phone]. Room 707.    Final next level of care: Skilled Nursing Facility Barriers to Discharge: Barriers Resolved   Patient Goals and CMS Choice Patient states their goals for this hospitalization and ongoing recovery are:: Pt states he would like to regain independence. CMS Medicare.gov Compare Post Acute Care list provided to:: Patient Choice offered to / list presented to : Patient, Spouse  Discharge Placement              Patient chooses bed at: [facility], [place] Patient to be transferred to facility by: [facility] Name of family member notified: Burna Mortimer Patient and family notified of of transfer: 09/25/21  Discharge Plan and Services     Post Acute Care Choice: Skilled Nursing Facility                               Social Determinants of Health (SDOH) Interventions     Readmission Risk Interventions No flowsheet data found.

## 2021-09-25 NOTE — Discharge Summary (Signed)
[facility]  Physician Discharge Summary   Patient ID: Brandon [name] MRN: [id number] DOB/AGE: [birth year] 81 y.o.  Admit date: 09/21/2021 Discharge date:   09/25/2021   PCP: Raina Mina., MD  DISCHARGE DIAGNOSES:  Principal Problem:   Lower extremity weakness Active Problems:   Acute metabolic encephalopathy   History of pulmonary embolism   Hypertensive urgency   Chronic diastolic CHF (congestive heart failure) (HCC)   DM (diabetes mellitus) ([place])   Mixed hyperlipidemia   Benign prostatic hyperplasia with urinary frequency    RECOMMENDATIONS FOR OUTPATIENT FOLLOW UP: Patient will need follow-up with Dr. Venetia Constable with neurosurgery in 3 weeks if he continues to have lower extremity weakness, back pain or urinary issues Lovenox to be continued till INR is therapeutic between 2 and 3 for 2 consecutive days.   Home Health: Going to SNF Equipment/Devices: None  CODE STATUS: Full code  DISCHARGE CONDITION: fair  Diet recommendation: Heart healthy  INITIAL HISTORY: 81 y.o. 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  At baseline patient ambulates with the use of a cane and has some memory issues after being on life support for a period in time after having bilateral pulmonary emboli.  Apparently patient had a fall in his yard.  Neighbors helped him back into the house.  Has had urinary frequency and some incontinence as well.  Blood pressure was elevated.  Patient was brought into the emergency department as a code stroke.  Not a tPA candidate as he was out of the window.  Seen by neurology.  Was hospitalized for further evaluation and management.  MRI was ordered but cannot be done because he has spinal stimulator which was not known previously.  Subsequently underwent CT scan of the  cervical thoracic and lumbar spine.  Seen by physical and Occupational Therapy.  Skilled nursing facility is recommended for rehab.    Consultations: Neurology  Procedures: Transthoracic echocardiogram    HOSPITAL COURSE:    * Lower extremity weakness- (present on admission) Patient has ambulatory dysfunction at baseline but is able to ambulate with her cane.  He has noticed some increasing weakness in his legs over the past few days.  Patient reports a history of neuropathy previously. Patient underwent CT scan of the cervical thoracic and lumbar spine.  Patient is noted to have evidence for stenoses but not severe enough to cause his symptoms.  MRI could not be done due to presence of spinal stimulator. Neurology continues to follow.  Metabolic work-up has been initiated. CK, folic acid, [id number] levels satisfactory.  HIV nonreactive.  TSH was normal previously. Seen by PT and OT.  Skilled nursing facility recommended for rehabilitation. Patient's lower extremity weakness continues to improve.  Imaging studies including CT of his cervical thoracic and lumbar spine was discussed by neurology with neurosurgery.  Since patient's symptoms are improving there is no indication for intervention at this time.  Outpatient follow-up with neurosurgery as recommended.  Pain control.  Acute metabolic encephalopathy- (present on admission) Patient apparently noted to be more confused than his usual at the time of admission.  Differential diagnosis included hypertensive encephalopathy versus stroke or TIA. Infectious work-up was unremarkable with negative UA and negative chest x-ray.  COVID-19 and influenza PCR negative.  CT head was unremarkable.  CT angiogram did not show any large vessel occlusion but atherosclerosis was noted in the  intracranial vessels with moderate irregularity and stenosis of the right PCA P1 and P2 segments. MRI cannot be done because he has a spinal stimulator.  CT scan repeated and  does not show any acute process either.  Mentation has returned back to normal.  Patient was seen by neurology.  No further work-up at this time.   History of pulmonary embolism- (present on admission) On chronic anticoagulation with warfarin.  Continue Lovenox for bridging purposes till INR is between 2 and 3 for 2 consecutive days.  Hypertensive urgency- (present on admission) Presented with a significantly high blood pressure of [id number] systolic.  Blood pressure has improved.  Continue ACE inhibitor.  Chronic diastolic CHF (congestive heart failure) ([place])- (present on admission) Was given Lasix x2 at admission.  Now seems to be reasonably well compensated.   Echocardiogram done during this admission showed normal systolic function.  Grade 1 diastolic dysfunction noted. Holding off on further doses of diuretics.   ACE inhibitor being continued.  Mildly elevated troponin likely of no clinical significance at this time since he does not have any chest discomfort or dyspnea.  DM (diabetes mellitus) ([place]) Not on any medications prior to admission.  HbA1c 6.6.  Recommend checking glucose level every so often at skilled nursing facility.  Mixed hyperlipidemia- (present on admission) Currently on fenofibrate.  Benign prostatic hyperplasia with urinary frequency- (present on admission) Patient was experiencing urinary incontinence and frequent urination.  His Flomax and finasteride were held.  He has been started on oxybutynin.  Dose can be increased depending on his symptoms.      Patient is stable.  Okay for discharge to skilled nursing facility for short-term rehab.   PERTINENT LABS:  The results of significant diagnostics from this hospitalization (including imaging, microbiology, ancillary and laboratory) are listed below for reference.    Microbiology: Recent Results (from the past 240 hour(s))  Resp Panel by RT-PCR (Flu A&B, Covid) Nasopharyngeal Swab     Status: None    Collection Time: 09/21/21  5:13 AM   Specimen: Nasopharyngeal Swab; Nasopharyngeal(NP) swabs in vial transport medium  Result Value Ref Range Status   SARS Coronavirus 2 by RT PCR NEGATIVE NEGATIVE Final    Comment: (NOTE) SARS-CoV-2 target nucleic acids are NOT DETECTED.  The SARS-CoV-2 RNA is generally detectable in upper respiratory specimens during the acute phase of infection. The lowest concentration of SARS-CoV-2 viral copies this assay can detect is 138 copies/mL. A negative result does not preclude SARS-Cov-2 infection and should not be used as the sole basis for treatment or other patient management decisions. A negative result may occur with  improper specimen collection/handling, submission of specimen other than nasopharyngeal swab, presence of viral mutation(s) within the areas targeted by this assay, and inadequate number of viral copies(<138 copies/mL). A negative result must be combined with clinical observations, patient history, and epidemiological information. The expected result is Negative.  Fact Sheet for Patients:  EntrepreneurPulse.com.au  Fact Sheet for Healthcare Providers:  IncredibleEmployment.be  This test is no t yet approved or cleared by the [place] FDA and  has been authorized for detection and/or diagnosis of SARS-CoV-2 by FDA under an Emergency Use Authorization (EUA). This EUA will remain  in effect (meaning this test can be used) for the duration of the COVID-19 declaration under Section 564(b)(1) of the Act, 21 U.S.C.section 360bbb-3(b)(1), unless the authorization is terminated  or revoked sooner.       Influenza A by PCR NEGATIVE NEGATIVE Final   Influenza B  by PCR NEGATIVE NEGATIVE Final    Comment: (NOTE) The Xpert Xpress SARS-CoV-2/FLU/RSV plus assay is intended as an aid in the diagnosis of influenza from Nasopharyngeal swab specimens and should not be used as a sole basis for treatment.  Nasal washings and aspirates are unacceptable for Xpert Xpress SARS-CoV-2/FLU/RSV testing.  Fact Sheet for Patients: EntrepreneurPulse.com.au  Fact Sheet for Healthcare Providers: IncredibleEmployment.be  This test is not yet approved or cleared by the [place] FDA and has been authorized for detection and/or diagnosis of SARS-CoV-2 by FDA under an Emergency Use Authorization (EUA). This EUA will remain in effect (meaning this test can be used) for the duration of the COVID-19 declaration under Section 564(b)(1) of the Act, 21 U.S.C. section 360bbb-3(b)(1), unless the authorization is terminated or revoked.  Performed at [facility], [place] [address], [place], [place] [address]   Urine Culture     Status: Abnormal   Collection Time: 09/21/21  6:20 AM   Specimen: Urine, Clean Catch  Result Value Ref Range Status   Specimen Description URINE, CLEAN CATCH  Final   Special Requests NONE  Final   Culture (A)  Final    <10,000 COLONIES/mL INSIGNIFICANT GROWTH Performed at [facility], [place] [address], [place], [place] [address]    Report Status 09/22/2021 FINAL  Final     Labs:  [id number] Labs   Lab Results  Component Value Date   [place] NEGATIVE 09/21/2021   SARSCOV2NAA NOT DETECTED 02/15/2019   SARSCOV2NAA NOT DETECTED 01/19/2019      Basic Metabolic Panel: Recent Labs  Lab 09/21/21 0513 09/21/21 0523 09/22/21 0139 09/23/21 0314 09/25/21 0347  NA 139 139 135 137 136  K 4.3 4.2 3.1* 3.6 4.1  CL 105 104 104 105 106  CO2 23  --  24 24 23   GLUCOSE 128* 127* 132* 141* 128*  BUN 9 12 15  25* 23  CREATININE 0.96 0.80 1.03 1.24 1.13  CALCIUM 9.3  --  9.0 9.1 9.2  MG  --   --   --  1.9 1.9   Liver Function Tests: Recent Labs  Lab 09/21/21 0513 09/23/21 0314  AST 31 23  ALT 19 19  ALKPHOS 71 69  BILITOT 1.1 0.6  PROT 7.2 6.6  ALBUMIN 3.8 3.3*    CBC: Recent Labs  Lab 09/21/21 0513 09/21/21 0523  09/22/21 0139 09/23/21 0314 09/24/21 0346 09/25/21 0347  WBC 9.9  --  9.3 9.0 9.8 9.4  NEUTROABS 6.3  --   --   --   --   --   HGB 13.8 15.0 13.2 13.0 13.6 13.5  HCT 43.7 44.0 40.7 39.9 41.6 40.9  MCV 90.9  --  87.9 87.9 88.5 88.3  PLT 181  --  193 184 204 207   Cardiac Enzymes: Recent Labs  Lab 09/22/21 1442  CKTOTAL 153   BNP: BNP (last 3 results) Recent Labs    09/21/21 0750  BNP 110.9*      CBG: Recent Labs  Lab 09/21/21 0514  GLUCAP 125*     IMAGING STUDIES CT HEAD WO CONTRAST (5MM)  Result Date: 09/22/2021 CLINICAL DATA:  81 year old male status post code stroke presentation yesterday. EXAM: CT HEAD WITHOUT CONTRAST TECHNIQUE: Contiguous axial images were obtained from the base of the skull through the vertex without intravenous contrast. RADIATION DOSE REDUCTION: This exam was performed according to the departmental dose-optimization program which includes automated exposure control, adjustment of the mA and/or kV according to patient  size and/or use of iterative reconstruction technique. COMPARISON:  CT head and CTA head and neck yesterday. FINDINGS: Brain: Stable non contrast CT appearance of the brain. Mild nonspecific ventriculomegaly and patchy, confluent periventricular white matter hypodensity. No midline shift, mass effect, or evidence of intracranial mass lesion. No acute intracranial hemorrhage identified. No cortically based acute infarct identified. Vascular: Calcified atherosclerosis at the skull base. Skull: No acute osseous abnormality identified. Sinuses/Orbits: Visualized paranasal sinuses and mastoids are stable and well aerated. Other: No acute orbit or scalp soft tissue finding. IMPRESSION: Stable non contrast CT appearance of the brain. No acute intracranial hemorrhage or acute cortically based infarct identified. Electronically Signed   By: Genevie Ann M.D.   On: 09/22/2021 08:19   CT CERVICAL SPINE WO CONTRAST  Result Date: 09/22/2021 CLINICAL  DATA:  Back pain.  Bilateral lower extremity weakness. EXAM: CT CERVICAL SPINE WITHOUT CONTRAST TECHNIQUE: Multidetector CT imaging of the cervical spine was performed without intravenous contrast. Multiplanar CT image reconstructions were also generated. RADIATION DOSE REDUCTION: This exam was performed according to the departmental dose-optimization program which includes automated exposure control, adjustment of the mA and/or kV according to patient size and/or use of iterative reconstruction technique. COMPARISON:  CT cervical spine 09/19/2015 FINDINGS: Alignment: Mild anterolisthesis C4-5 Skull base and vertebrae: Negative for fracture or mass Soft tissues and spinal canal: Negative for soft tissue mass or edema. Mild atherosclerotic calcification carotid bifurcation bilaterally. Disc levels: C2-3: Disc degeneration. Left facet degeneration. No significant stenosis C3-4: Disc degeneration with diffuse uncinate spurring. Severe facet hypertrophy on the left and mild facet hypertrophy on the right. Severe left foraminal stenosis. Mild spinal stenosis. Mild right foraminal stenosis C4-5: Moderate facet hypertrophy bilaterally. Mild disc degeneration. Mild foraminal narrowing bilaterally. C5-6: Disc degeneration with diffuse uncinate spurring. Mild spinal stenosis. Severe foraminal encroachment bilaterally due to spurring. C6-7: Disc degeneration with diffuse uncinate spurring. Severe foraminal encroachment bilaterally. Mild spinal stenosis C7-T1: Bilateral facet degeneration. Mild foraminal narrowing bilaterally. Upper chest: Lung apices clear bilaterally. Other: None IMPRESSION: Multilevel cervical spondylosis. Multilevel spinal and foraminal stenosis most prominent at C5-6 and C6-7. Electronically Signed   By: Franchot Gallo M.D.   On: 09/22/2021 15:32   CT THORACIC SPINE WO CONTRAST  Result Date: 09/22/2021 CLINICAL DATA:  Mid and lower back pain EXAM: CT THORACIC AND LUMBAR SPINE WITHOUT CONTRAST  TECHNIQUE: Multidetector CT imaging of the thoracic and lumbar spine was performed without contrast. Multiplanar CT image reconstructions were also generated. RADIATION DOSE REDUCTION: This exam was performed according to the departmental dose-optimization program which includes automated exposure control, adjustment of the mA and/or kV according to patient size and/or use of iterative reconstruction technique. COMPARISON:  Multiple exams, including CT abdomen 02/01/2020 and CT chest 05/12/2013 FINDINGS: CT THORACIC SPINE FINDINGS Alignment: Mild thoracic kyphosis. No subluxation in the thoracic spine. Vertebrae: Interbody fusion at C6-7. No thoracic spine fracture or acute bony findings identified. Paraspinal and other soft tissues: Coronary, aortic arch, and branch vessel atherosclerotic vascular disease. Elevated left hemidiaphragm. Old granulomatous disease involving the lungs and spleen. 0.8 cm prevascular lymph node on image 56 series 5. Atelectasis along the left hemidiaphragm. Pneumobilia. Disc levels: C5-6: Prominent bilateral foraminal impingement due to uncinate spurring. C6-7: Moderate bilateral foraminal stenosis due to spurring along the intervertebral fusion site. C7-T1: No impingement.  Degenerative facet arthropathy. T1-2: No impingement.  Degenerative left facet arthropathy. T2-3: Unremarkable. T3-4: Unremarkable. T4-5: Unremarkable. T5-6: Unremarkable. T6-7: Unremarkable. T7-8: Unremarkable. T8-9: Mild bilateral foraminal stenosis due to degenerative facet  arthropathy. T9-10: Unremarkable. The lead to the dorsal column stimulator are at the T9 and T10 level. T10-11: Unremarkable. T11-12: Unremarkable. CT LUMBAR SPINE FINDINGS Segmentation: The lowest lumbar type non-rib-bearing vertebra is labeled as L5. Alignment: 4 mm degenerative retrolisthesis at L2-3. Mild levoconvex lumbar scoliosis with rotary component. Vertebrae: No fracture or acute bony findings. Mild bridging spurring of the sacroiliac  joints. Loss of disc height at the L2-3 level. Paraspinal and other soft tissues: Atherosclerosis is present, including aortoiliac atherosclerotic disease. Nonobstructive left nephrolithiasis. Disc levels: T12-L1: Unremarkable. L1-2: Unremarkable. L2-3: Mild bilateral foraminal stenosis due to intervertebral spurring and degenerative facet arthropathy. L3-4: Suspected prominent central narrowing of the thecal sac and mild bilateral foraminal stenosis due to disc bulge, facet arthropathy, ligamentum flavum redundancy, and intervertebral spurring. L4-5: Suspected prominent central narrowing of the thecal sac and mild bilateral foraminal stenosis again due to facet arthropathy, ligamentum flavum redundancy, disc bulge, and spurring. L5-S1: Moderate left and mild right foraminal stenosis due to facet spurring. IMPRESSION: CT THORACIC SPINE IMPRESSION 1. Mild thoracic kyphosis; spondylosis and degenerative disc disease lead to substantial foraminal impingement at C5-6; moderate bilateral foraminal impingement at C6-7; and mild bilateral foraminal impingement at T8-9. 2. The dorsal column stimulator leads are at the T9 and T10 level. CT LUMBAR SPINE IMPRESSION 1. Lumbar spondylosis and degenerative disc disease causing prominent impingement at L3-4 and L4-5; moderate impingement at L5-S1; and mild impingement at L2-3, as detailed above. 2. 4 mm degenerative retrolisthesis at L2-3. 3. Other imaging findings of potential clinical significance: Aortic Atherosclerosis (ICD10-I70.0). Coronary atherosclerosis. Elevated left hemidiaphragm with atelectasis in the left lower lobe along the diaphragm. Pneumobilia. Nonobstructive left nephrolithiasis. Electronically Signed   By: Gaylyn Rong M.D.   On: 09/22/2021 14:57   CT LUMBAR SPINE WO CONTRAST  Result Date: 09/22/2021 CLINICAL DATA:  Mid and lower back pain EXAM: CT THORACIC AND LUMBAR SPINE WITHOUT CONTRAST TECHNIQUE: Multidetector CT imaging of the thoracic and  lumbar spine was performed without contrast. Multiplanar CT image reconstructions were also generated. RADIATION DOSE REDUCTION: This exam was performed according to the departmental dose-optimization program which includes automated exposure control, adjustment of the mA and/or kV according to patient size and/or use of iterative reconstruction technique. COMPARISON:  Multiple exams, including CT abdomen 02/01/2020 and CT chest 05/12/2013 FINDINGS: CT THORACIC SPINE FINDINGS Alignment: Mild thoracic kyphosis. No subluxation in the thoracic spine. Vertebrae: Interbody fusion at C6-7. No thoracic spine fracture or acute bony findings identified. Paraspinal and other soft tissues: Coronary, aortic arch, and branch vessel atherosclerotic vascular disease. Elevated left hemidiaphragm. Old granulomatous disease involving the lungs and spleen. 0.8 cm prevascular lymph node on image 56 series 5. Atelectasis along the left hemidiaphragm. Pneumobilia. Disc levels: C5-6: Prominent bilateral foraminal impingement due to uncinate spurring. C6-7: Moderate bilateral foraminal stenosis due to spurring along the intervertebral fusion site. C7-T1: No impingement.  Degenerative facet arthropathy. T1-2: No impingement.  Degenerative left facet arthropathy. T2-3: Unremarkable. T3-4: Unremarkable. T4-5: Unremarkable. T5-6: Unremarkable. T6-7: Unremarkable. T7-8: Unremarkable. T8-9: Mild bilateral foraminal stenosis due to degenerative facet arthropathy. T9-10: Unremarkable. The lead to the dorsal column stimulator are at the T9 and T10 level. T10-11: Unremarkable. T11-12: Unremarkable. CT LUMBAR SPINE FINDINGS Segmentation: The lowest lumbar type non-rib-bearing vertebra is labeled as L5. Alignment: 4 mm degenerative retrolisthesis at L2-3. Mild levoconvex lumbar scoliosis with rotary component. Vertebrae: No fracture or acute bony findings. Mild bridging spurring of the sacroiliac joints. Loss of disc height at the L2-3 level.  Paraspinal and other  soft tissues: Atherosclerosis is present, including aortoiliac atherosclerotic disease. Nonobstructive left nephrolithiasis. Disc levels: T12-L1: Unremarkable. L1-2: Unremarkable. L2-3: Mild bilateral foraminal stenosis due to intervertebral spurring and degenerative facet arthropathy. L3-4: Suspected prominent central narrowing of the thecal sac and mild bilateral foraminal stenosis due to disc bulge, facet arthropathy, ligamentum flavum redundancy, and intervertebral spurring. L4-5: Suspected prominent central narrowing of the thecal sac and mild bilateral foraminal stenosis again due to facet arthropathy, ligamentum flavum redundancy, disc bulge, and spurring. L5-S1: Moderate left and mild right foraminal stenosis due to facet spurring. IMPRESSION: CT THORACIC SPINE IMPRESSION 1. Mild thoracic kyphosis; spondylosis and degenerative disc disease lead to substantial foraminal impingement at C5-6; moderate bilateral foraminal impingement at C6-7; and mild bilateral foraminal impingement at T8-9. 2. The dorsal column stimulator leads are at the T9 and T10 level. CT LUMBAR SPINE IMPRESSION 1. Lumbar spondylosis and degenerative disc disease causing prominent impingement at L3-4 and L4-5; moderate impingement at L5-S1; and mild impingement at L2-3, as detailed above. 2. 4 mm degenerative retrolisthesis at L2-3. 3. Other imaging findings of potential clinical significance: Aortic Atherosclerosis (ICD10-I70.0). Coronary atherosclerosis. Elevated left hemidiaphragm with atelectasis in the left lower lobe along the diaphragm. Pneumobilia. Nonobstructive left nephrolithiasis. Electronically Signed   By: Van Clines M.D.   On: 09/22/2021 14:57   DG Chest Port 1 View  Result Date: 09/21/2021 CLINICAL DATA:  81 year old male code stroke presentation. EXAM: PORTABLE CHEST 1 VIEW COMPARISON:  CTA neck 0536 hours today. Chest radiograph 06/25/2017 and earlier. FINDINGS: Portable AP upright view at  0609 hours. Chronic elevation of the left hemidiaphragm with subjacent bowel is stable. Chronic lower thoracic spinal stimulator device. Mediastinal contours remain within normal limits. Visualized tracheal air column is within normal limits. Chronic left lung base atelectasis. Elsewhere when allowing for portable technique the lungs are clear. IMPRESSION: No acute cardiopulmonary abnormality. Chronic elevation of the left hemidiaphragm. Electronically Signed   By: Genevie Ann M.D.   On: 09/21/2021 06:47   ECHOCARDIOGRAM COMPLETE  Result Date: 09/22/2021    ECHOCARDIOGRAM REPORT   Patient Name:   Brandon [name] [facility] Date of Exam: 09/21/2021 Medical Rec #:  [id number]         Height:       76.0 in Accession #:    [id number]        Weight:       223.0 lb Date of Birth:  11/26/1940         BSA:          2.320 m Patient Age:    44 years          BP:           130/80 mmHg Patient Gender: M                 HR:           70 bpm. Exam Location:  Inpatient Procedure: 2D Echo, Cardiac Doppler and Color Doppler Indications:    Acute CHF  History:        Patient has no prior history of Echocardiogram examinations.                 CHF, CAD; Risk Factors:Diabetes, Hypertension and Dyslipidemia.  Sonographer:    Clayton Lefort RDCS (AE) Referring Phys: [id number] RONDELL A SMITH IMPRESSIONS  1. Left ventricular ejection fraction, by estimation, is 55 to 60%. The left ventricle has normal function. The left ventricle has no regional wall motion abnormalities. There is mild left  ventricular hypertrophy. Left ventricular diastolic parameters are consistent with Grade I diastolic dysfunction (impaired relaxation).  2. Right ventricular systolic function is normal. The right ventricular size is normal.  3. Measured obliquly by tech. Left atrial size was mildly dilated.  4. The mitral valve is abnormal. No evidence of mitral valve regurgitation. No evidence of mitral stenosis.  5. Small gradient no stenosis . The aortic valve is normal in  structure. There is moderate calcification of the aortic valve. There is moderate thickening of the aortic valve. Aortic valve regurgitation is not visualized. Aortic valve sclerosis/calcification is present, without any evidence of aortic stenosis.  6. The inferior vena cava is normal in size with greater than 50% respiratory variability, suggesting right atrial pressure of 3 mmHg. FINDINGS  Left Ventricle: Left ventricular ejection fraction, by estimation, is 55 to 60%. The left ventricle has normal function. The left ventricle has no regional wall motion abnormalities. The left ventricular internal cavity size was normal in size. There is  mild left ventricular hypertrophy. Left ventricular diastolic parameters are consistent with Grade I diastolic dysfunction (impaired relaxation). Right Ventricle: The right ventricular size is normal. No increase in right ventricular wall thickness. Right ventricular systolic function is normal. Left Atrium: Measured obliquly by tech. Left atrial size was mildly dilated. Right Atrium: Right atrial size was normal in size. Pericardium: There is no evidence of pericardial effusion. Mitral Valve: The mitral valve is abnormal. There is mild thickening of the mitral valve leaflet(s). Mild mitral annular calcification. No evidence of mitral valve regurgitation. No evidence of mitral valve stenosis. Tricuspid Valve: The tricuspid valve is normal in structure. Tricuspid valve regurgitation is not demonstrated. No evidence of tricuspid stenosis. Aortic Valve: Small gradient no stenosis. The aortic valve is normal in structure. There is moderate calcification of the aortic valve. There is moderate thickening of the aortic valve. Aortic valve regurgitation is not visualized. Aortic valve sclerosis/calcification is present, without any evidence of aortic stenosis. Aortic valve mean gradient measures 7.0 mmHg. Aortic valve peak gradient measures 12.2 mmHg. Aortic valve area, by VTI measures  3.97 cm. Pulmonic Valve: The pulmonic valve was normal in structure. Pulmonic valve regurgitation is not visualized. No evidence of pulmonic stenosis. Aorta: The aortic root is normal in size and structure. Venous: The inferior vena cava is normal in size with greater than 50% respiratory variability, suggesting right atrial pressure of 3 mmHg. IAS/Shunts: The interatrial septum was not well visualized.  LEFT VENTRICLE PLAX 2D LVIDd:         5.80 cm     Diastology LVIDs:         3.20 cm     LV e' medial:    7.29 cm/s LV PW:         0.90 cm     LV E/e' medial:  10.0 LV IVS:        1.20 cm     LV e' lateral:   5.44 cm/s LVOT diam:     2.10 cm     LV E/e' lateral: 13.4 LV SV:         146 LV SV Index:   63 LVOT Area:     3.46 cm  LV Volumes (MOD) LV vol d, MOD A2C: 69.5 ml LV vol d, MOD A4C: 88.5 ml LV vol s, MOD A2C: 26.3 ml LV vol s, MOD A4C: 40.1 ml LV SV MOD A2C:     43.2 ml LV SV MOD A4C:     88.5 ml  LV SV MOD BP:      48.3 ml RIGHT VENTRICLE RV S prime:     16.20 cm/s TAPSE (M-mode): 1.6 cm LEFT ATRIUM             Index        RIGHT ATRIUM           Index LA diam:        4.80 cm 2.07 cm/m   RA Area:     13.00 cm LA Vol (A2C):   59.4 ml 25.60 ml/m  RA Volume:   32.10 ml  13.84 ml/m LA Vol (A4C):   56.5 ml 24.35 ml/m LA Biplane Vol: 60.6 ml 26.12 ml/m  AORTIC VALVE                     PULMONIC VALVE AV Area (Vmax):    3.64 cm      PV Vmax:       0.84 m/s AV Area (Vmean):   3.61 cm      PV Peak grad:  2.8 mmHg AV Area (VTI):     3.97 cm AV Vmax:           175.00 cm/s AV Vmean:          122.000 cm/s AV VTI:            0.367 m AV Peak Grad:      12.2 mmHg AV Mean Grad:      7.0 mmHg LVOT Vmax:         184.00 cm/s LVOT Vmean:        127.000 cm/s LVOT VTI:          0.421 m LVOT/AV VTI ratio: 1.15  AORTA Ao Root diam: 3.30 cm Ao Asc diam:  3.30 cm MITRAL VALVE MV Area (PHT): 2.91 cm     SHUNTS MV Decel Time: 261 msec     Systemic VTI:  0.42 m MV E velocity: 72.70 cm/s   Systemic Diam: 2.10 cm MV A velocity:  113.00 cm/s MV E/A ratio:  0.64 Jenkins Rouge MD Electronically signed by Jenkins Rouge MD Signature Date/Time: 09/22/2021/12:41:32 PM    Final    CT HEAD CODE STROKE WO CONTRAST  Result Date: 09/21/2021 CLINICAL DATA:  Code stroke. 81 year old male with left side weakness and abnormal speech. EXAM: CT HEAD WITHOUT CONTRAST TECHNIQUE: Contiguous axial images were obtained from the base of the skull through the vertex without intravenous contrast. RADIATION DOSE REDUCTION: This exam was performed according to the departmental dose-optimization program which includes automated exposure control, adjustment of the mA and/or kV according to patient size and/or use of iterative reconstruction technique. COMPARISON:  Head CT 12/27/2020. FINDINGS: Brain: Stable cerebral volume. No midline shift, ventriculomegaly, mass effect, evidence of mass lesion, intracranial hemorrhage or evidence of cortically based acute infarction. Patchy and confluent bilateral periventricular white matter hypodensity appears stable. Vascular: Calcified atherosclerosis at the skull base. No suspicious intracranial vascular hyperdensity. Skull: No acute osseous abnormality identified. Sinuses/Orbits: Visualized paranasal sinuses and mastoids are clear. Other: No acute orbit or scalp soft tissue finding. ASPECTS [facility] Stroke Program Early CT Score) Total score (0-10 with 10 being normal): 10 IMPRESSION: 1. Stable non contrast CT appearance of the brain with chronic white matter disease. ASPECTS 10. 2. These results were communicated to Dr. Leonel Ramsay at 5:35 am on 09/21/2021 by text page via the [facility] messaging system. Electronically Signed   By: Genevie Ann M.D.   On: 09/21/2021 05:35  CT ANGIO HEAD NECK W WO CM (CODE STROKE)  Result Date: 09/21/2021 CLINICAL DATA:  81 year old male code stroke presentation. EXAM: CT ANGIOGRAPHY HEAD AND NECK TECHNIQUE: Multidetector CT imaging of the head and neck was performed using the standard protocol  during bolus administration of intravenous contrast. Multiplanar CT image reconstructions and MIPs were obtained to evaluate the vascular anatomy. Carotid stenosis measurements (when applicable) are obtained utilizing NASCET criteria, using the distal internal carotid diameter as the denominator. RADIATION DOSE REDUCTION: This exam was performed according to the departmental dose-optimization program which includes automated exposure control, adjustment of the mA and/or kV according to patient size and/or use of iterative reconstruction technique. CONTRAST:  27mL OMNIPAQUE IOHEXOL 350 MG/ML SOLN COMPARISON:  Neck CT 08/30/2021. FINDINGS: CTA NECK Skeleton: Advanced cervical spine degeneration, with intervening interbody ankylosis, facet ankylosis, and severe facet arthropathy. No acute osseous abnormality identified. Upper chest: Negative. Other neck: Partially calcified 14 mm right inferior thyroid pole nodule. Not clinically significant; no follow-up imaging recommended (ref: J Am Coll Radiol. 2015 Feb;12(2): 143-50). Asymmetric nodular enhancement of the right submandibular gland in an area of about 17 mm, with a small superior pole are feeding vessel (series 9, image 82). This corresponds to the heterogeneous portion of the gland described in December. Other face soft tissues appear negative. Aortic arch: 3 vessel arch configuration with tortuous proximal great vessels. Mild calcified arch atherosclerosis. Right carotid system: Tortuous brachiocephalic artery and right CCA origin without plaque or stenosis. Mild calcified plaque at the right ICA origin and bulb without stenosis. Mildly tortuous right ICA. Left carotid system: Tortuous proximal left CCA without plaque or stenosis. Mild calcified plaque at the left ICA origin and bulb. No stenosis results. Vertebral arteries: Tortuous proximal right subclavian artery without stenosis. Mild calcified plaque at the right vertebral artery origin and mild origin  stenosis. Non dominant right vertebral artery remains patent through the V2 segment. There is proximal V3 segment calcified plaque with additional mild stenosis on series 7, image 24. The vessel is diminutive but remains patent to the skull base. Tortuous proximal left subclavian artery without stenosis. Normal left vertebral artery origin. Dominant left vertebral artery. Mild V2 segment calcified plaque but no left vertebral stenosis to the skull base. CTA HEAD Posterior circulation: Dominant left vertebral V4 segment primarily supplies the basilar with normal left PICA origin. Diminutive right vertebral artery functionally terminates in PICA although there is a small V4 continuation to the basilar. Patent basilar artery with mild irregularity but no stenosis. Patent SCA and PCA origins. Left PCA branches are within normal limits. There is moderate somewhat long segment irregularity and stenosis of the right P1 and P2 best seen on series 11, image 24. But distal right PCA branches do appear patent and appear relatively normal. Anterior circulation: Both ICA siphons are patent. Mild to moderate calcified siphon plaque bilaterally. No significant right siphon stenosis. On the left there is mild stenosis of the proximal cavernous ICA. Patent carotid termini. Patent MCA and ACA origins. Normal anterior communicating artery. Bilateral ACA branches are within normal limits. Left MCA M1 segment and bifurcation are patent without stenosis. No left MCA branch occlusion. There is mild irregularity of the posterior left M2 (series 12, image 35). Right MCA M1 segment is tortuous. Right MCA bifurcation is patent without stenosis. Right MCA branches are patent. Mild right M2 and M3 branch irregularity. Venous sinuses: Patent. Anatomic variants: Dominant left vertebral artery primarily supplies the basilar. The right is diminutive and functionally terminates in  PICA. Review of the MIP images confirms the above findings  IMPRESSION: 1. Negative for large vessel occlusion. 2. 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 Mild right ICA siphon stenosis. Other bilateral carotid plaque with no significant stenosis. 3. 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 Salient findings were communicated to Dr. Leonel Ramsay at 6:07 am on 09/21/2021 by text page via the [facility] messaging system. Electronically Signed   By: Genevie Ann M.D.   On: 09/21/2021 06:07    DISCHARGE EXAMINATION: Vitals:   09/24/21 1932 09/24/21 2317 09/25/21 0322 09/25/21 0750  BP: 121/70 117/78 129/67 131/67  Pulse: 69 64 (!) 55 65  Resp: 16 14 18    Temp: 97.6 F (36.4 C) 98.7 F (37.1 C) 97.6 F (36.4 C) 97.9 F (36.6 C)  TempSrc: Oral Oral Oral Oral  SpO2: 94% 94% 95% 96%   General appearance: Awake alert.  In no distress Resp: Clear to auscultation bilaterally.  Normal effort Cardio: S1-S2 is normal regular.  No S3-S4.  No rubs murmurs or bruit GI: Abdomen is soft.  Nontender nondistended.  Bowel sounds are present normal.  No masses organomegaly   DISPOSITION: SNF  Discharge Instructions     Call MD for:  difficulty breathing, headache or visual disturbances   Complete by: As directed    Call MD for:  extreme fatigue   Complete by: As directed    Call MD for:  persistant dizziness or light-headedness   Complete by: As directed    Call MD for:  persistant nausea and vomiting   Complete by: As directed    Call MD for:  severe uncontrolled pain   Complete by: As directed    Call MD for:  temperature >100.4   Complete by: As directed    Diet - low sodium heart healthy   Complete by: As directed    Discharge instructions   Complete by: As directed    Please review instructions on the discharge summary.  You were cared for by a hospitalist during your hospital stay. If you have any questions about your discharge medications or  the care you received while you were in the hospital after you are discharged, you can call the unit and asked to speak with the hospitalist on call if the hospitalist that took care of you is not available. Once you are discharged, your primary care physician will handle any further medical issues. Please note that NO REFILLS for any discharge medications will be authorized once you are discharged, as it is imperative that you return to your primary care physician (or establish a relationship with a primary care physician if you do not have one) for your aftercare needs so that they can reassess your need for medications and monitor your lab values. If you do not have a primary care physician, you can call [phone] for a physician referral.   Increase activity slowly   Complete by: As directed          Allergies as of 09/25/2021       Reactions   Codeine Hives   Statins    Cramps (ALLERGY/intolerance)   Allopurinol Rash   Unknown   Doxycycline Rash   Unknown        Medication List     STOP taking these medications    finasteride 5 MG tablet Commonly known as: PROSCAR   oxyCODONE-acetaminophen 10-325 MG tablet Commonly known as: PERCOCET Replaced  by: oxyCODONE-acetaminophen 5-325 MG tablet   tamsulosin 0.4 MG Caps capsule Commonly known as: FLOMAX       TAKE these medications    ALIGN PO Take 1 capsule by mouth daily.   cyanocobalamin 1000 MCG tablet Take 1 tablet (1,000 mcg total) by mouth daily. Start taking on: September 28, 2021   DULoxetine 30 MG capsule Commonly known as: CYMBALTA Take 30 mg by mouth every evening.   enoxaparin 100 MG/ML injection Commonly known as: LOVENOX Inject 1 mL (100 mg total) into the skin every 12 (twelve) hours.   ergocalciferol 1.25 MG (50000 UT) capsule Commonly known as: VITAMIN D2 Take 50,000 Units by mouth every Monday.   fenofibrate 160 MG tablet Take 160 mg by mouth at bedtime.   fosinopril 10 MG tablet Commonly  known as: MONOPRIL Take 10 mg by mouth daily.   omeprazole 10 MG capsule Commonly known as: PRILOSEC Take 10 mg by mouth daily.   oxybutynin 5 MG tablet Commonly known as: DITROPAN Take 1 tablet (5 mg total) by mouth 2 (two) times daily.   oxyCODONE-acetaminophen 5-325 MG tablet Commonly known as: PERCOCET/ROXICET Take 1-2 tablets by mouth every 6 (six) hours as needed for severe pain. Replaces: oxyCODONE-acetaminophen 10-325 MG tablet   polyethylene glycol 17 g packet Commonly known as: MIRALAX / GLYCOLAX Take 17 g by mouth daily.   senna-docusate 8.6-50 MG tablet Commonly known as: Senokot-S Take 2 tablets by mouth 2 (two) times daily.   warfarin 5 MG tablet Commonly known as: COUMADIN Take 1 tablet (5 mg total) by mouth daily. What changed: when to take this          Contact information for follow-up providers     Judith Part, MD Follow up in 3 week(s).   Specialty: Neurosurgery Why: if patient has persistent back pain and weakness Contact information: [address] [place] [address] [phone]              Contact information for after-discharge care     Destination     [facility] .   Service: Skilled Chiropodist information: [place] [phone]                     TOTAL DISCHARGE TIME: 35 minutes  [facility] Pager on [email or web address]  09/25/2021, 10:27 AM

## 2021-09-26 LAB — METHYLMALONIC ACID, SERUM: Methylmalonic Acid, Quantitative: 282 nmol/L (ref 0–378)

## 2022-04-09 DIAGNOSIS — I878 Other specified disorders of veins: Secondary | ICD-10-CM | POA: Insufficient documentation

## 2022-08-06 DIAGNOSIS — K0889 Other specified disorders of teeth and supporting structures: Secondary | ICD-10-CM | POA: Insufficient documentation

## 2022-09-30 DIAGNOSIS — Z8719 Personal history of other diseases of the digestive system: Secondary | ICD-10-CM | POA: Insufficient documentation

## 2022-09-30 DIAGNOSIS — J309 Allergic rhinitis, unspecified: Secondary | ICD-10-CM | POA: Insufficient documentation

## 2022-11-05 IMAGING — CT CT L SPINE W/O CM
3 series · 9 of 33 positions shown, 11 images · non-contrast
Comparison: Multiple exams, including CT abdomen 02/01/2020 and CT
chest 05/12/2013

CLINICAL DATA: Mid and lower back pain

EXAM:
CT THORACIC AND LUMBAR SPINE WITHOUT CONTRAST
TECHNIQUE: Multidetector CT imaging of the thoracic and lumbar spine was
performed without contrast. Multiplanar CT image reconstructions
were also generated.
RADIATION DOSE REDUCTION: This exam was performed according to the
departmental dose-optimization program which includes automated
exposure control, adjustment of the mA and/or kV according to
patient size and/or use of iterative reconstruction technique.

[Series 5: l spine 2.0 (person_name) (person_name) · axial · 0.38mm/px · z∈[+1122,+1122]mm · 1 of 149 slices shown, 2 images]
[im 80/149  soft-tissue]
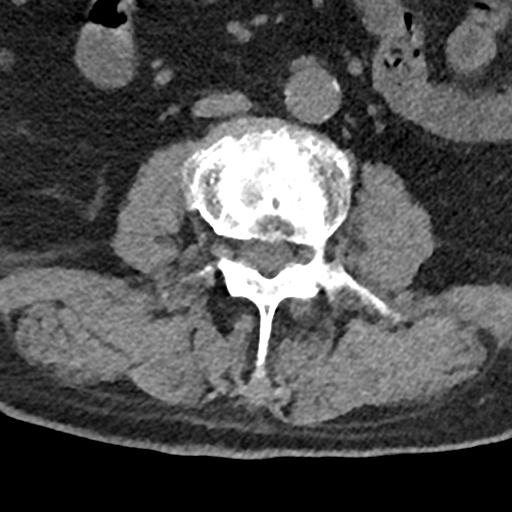
[im 80/149  bone]
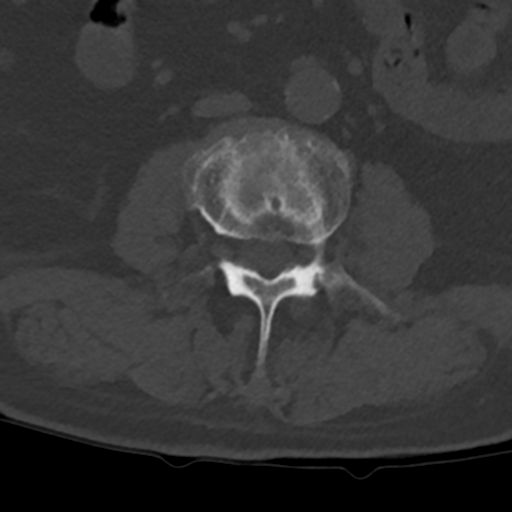

[Series 8: coronal st · coronal · 0.40mm/px · 3 of 82 slices shown]
[im 17/82  bone]
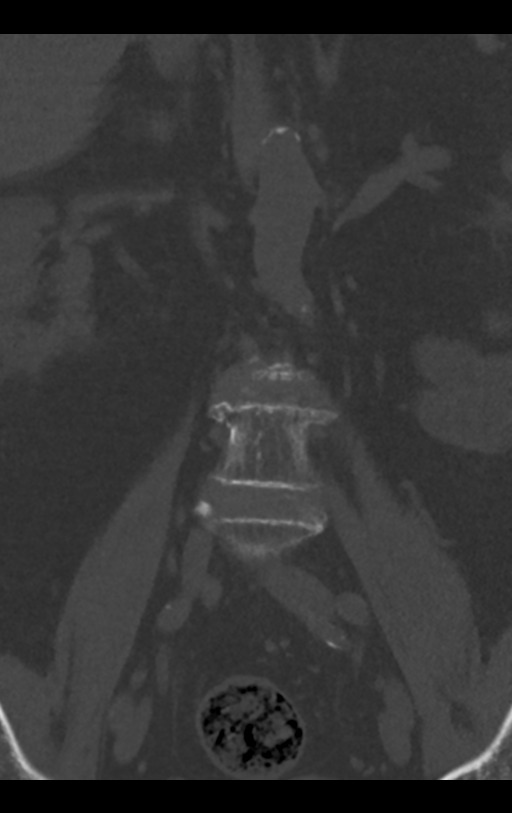
[im 33/82  bone]
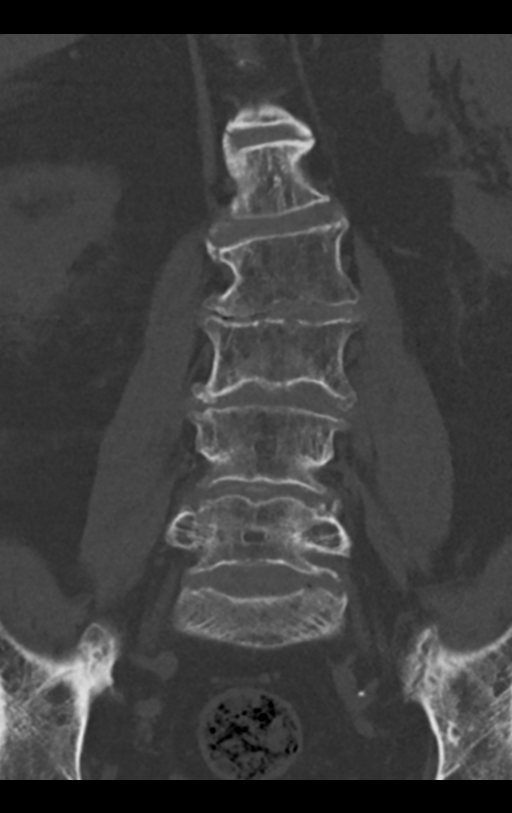
[im 49/82  bone]
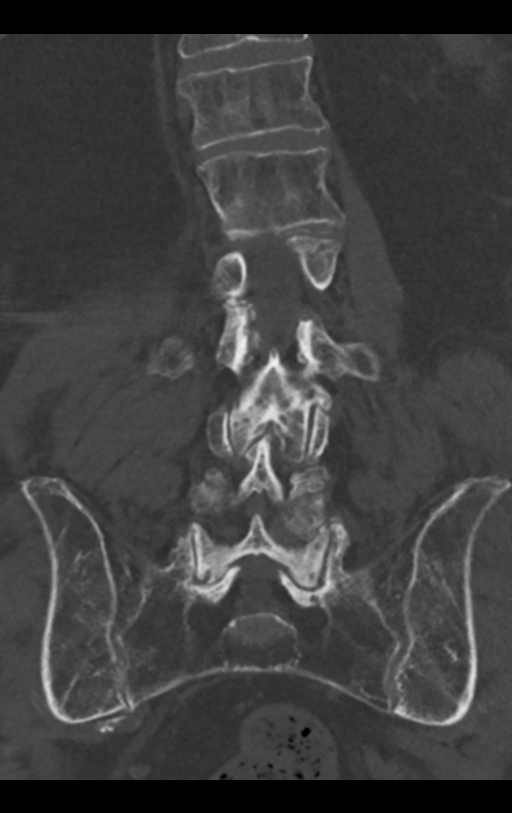

[Series 9: sagittal st · sagittal · 0.36mm/px · 5 of 85 slices shown, 6 images]
[im 29/85  bone]
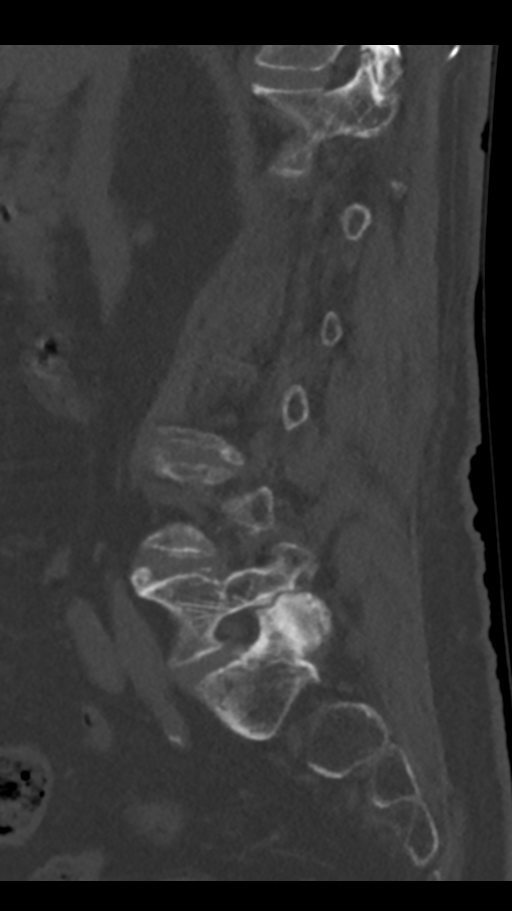
[im 36/85  bone]
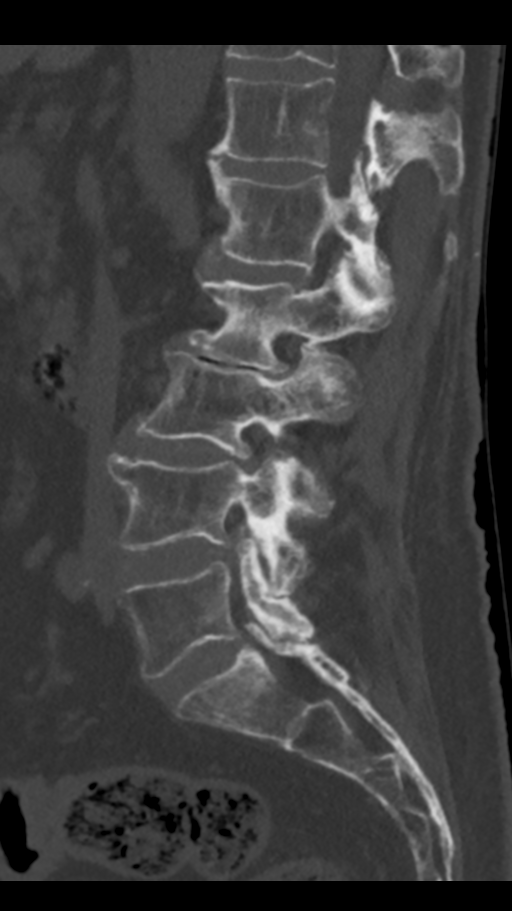
[im 43/85  soft-tissue]
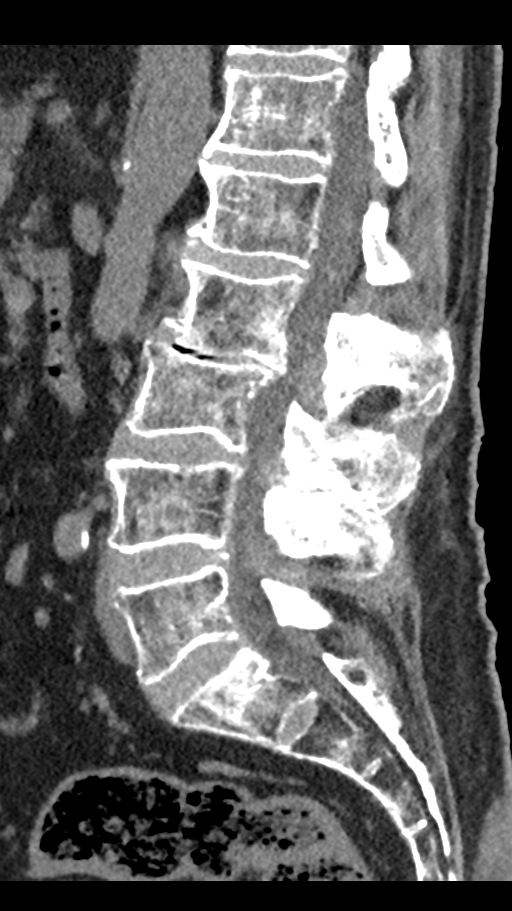
[im 43/85  bone]
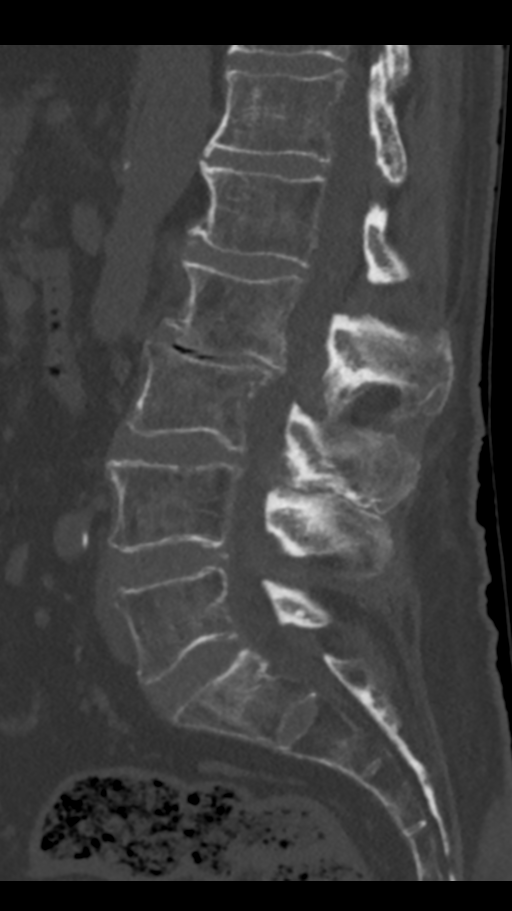
[im 50/85  bone]
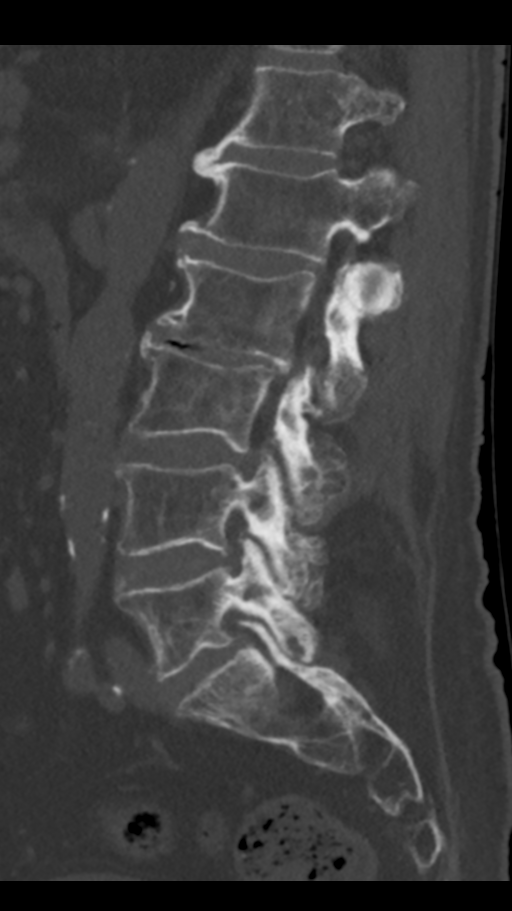
[im 57/85  bone]
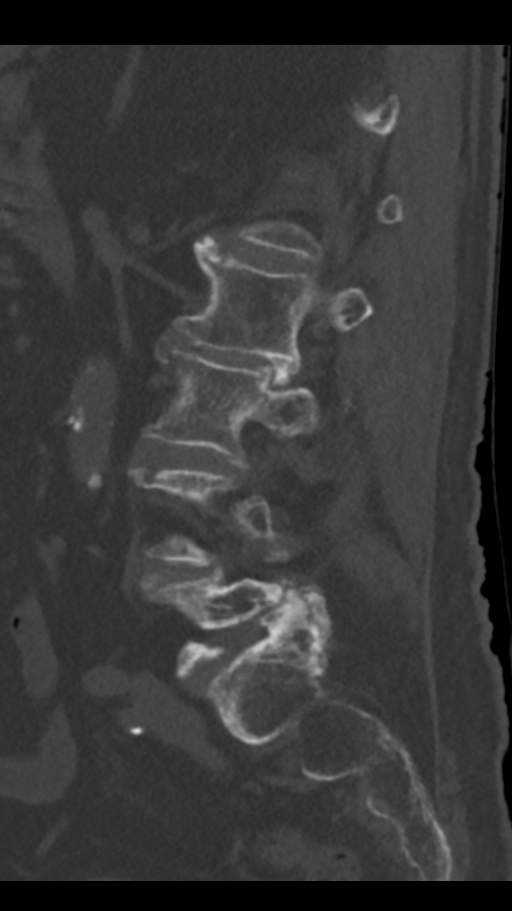

[9 of 33 positions shown; findings below may reference images not displayed]

FINDINGS: CT THORACIC SPINE FINDINGS

Alignment: Mild thoracic kyphosis. No subluxation in the thoracic
spine.

Vertebrae: Interbody fusion at C6-7. No thoracic spine fracture or
acute bony findings identified.

Paraspinal and other soft tissues: Coronary, aortic arch, and branch
vessel atherosclerotic vascular disease. Elevated left
hemidiaphragm. Old granulomatous disease involving the lungs and
spleen. 0.8 cm prevascular lymph node on image 56 series 5.
Atelectasis along the left hemidiaphragm. Pneumobilia.

Disc levels:

C5-6: Prominent bilateral foraminal impingement due to uncinate
spurring.

C6-7: Moderate bilateral foraminal stenosis due to spurring along
the intervertebral fusion site.

C7-T1: No impingement.  Degenerative facet arthropathy.

T1-2: No impingement.  Degenerative left facet arthropathy.

T2-3: Unremarkable.

T3-4: Unremarkable.

T4-5: Unremarkable.

T5-6: Unremarkable.

T6-7: Unremarkable.

T7-8: Unremarkable.

T8-9: Mild bilateral foraminal stenosis due to degenerative facet
arthropathy.

T9-10: Unremarkable. The lead to the dorsal column stimulator are at
the T9 and T10 level.

T10-11: Unremarkable.

T11-12: Unremarkable.

CT LUMBAR SPINE FINDINGS

Segmentation: The lowest lumbar type non-rib-bearing vertebra is
labeled as L5.

Alignment: 4 mm degenerative retrolisthesis at L2-3. Mild levoconvex
lumbar scoliosis with rotary component.

Vertebrae: No fracture or acute bony findings. Mild bridging
spurring of the sacroiliac joints. Loss of disc height at the L2-3
level.

Paraspinal and other soft tissues: Atherosclerosis is present,
including aortoiliac atherosclerotic disease. Nonobstructive left
nephrolithiasis.

Disc levels: T12-L1: Unremarkable.

L1-2: Unremarkable.

L2-3: Mild bilateral foraminal stenosis due to intervertebral
spurring and degenerative facet arthropathy.

L3-4: Suspected prominent central narrowing of the thecal sac and
mild bilateral foraminal stenosis due to disc bulge, facet
arthropathy, ligamentum flavum redundancy, and intervertebral
spurring.

L4-5: Suspected prominent central narrowing of the thecal sac and
mild bilateral foraminal stenosis again due to facet arthropathy,
ligamentum flavum redundancy, disc bulge, and spurring.

L5-S1: Moderate left and mild right foraminal stenosis due to facet
spurring.
IMPRESSION: CT THORACIC SPINE IMPRESSION

1. Mild thoracic kyphosis; spondylosis and degenerative disc disease
lead to substantial foraminal impingement at C5-6; moderate
bilateral foraminal impingement at C6-7; and mild bilateral
foraminal impingement at T8-9.
2. The dorsal column stimulator leads are at the T9 and T10 level.

CT LUMBAR SPINE IMPRESSION

1. Lumbar spondylosis and degenerative disc disease causing
prominent impingement at L3-4 and L4-5; moderate impingement at
L5-S1; and mild impingement at L2-3, as detailed above.
2. 4 mm degenerative retrolisthesis at L2-3.
3. Other imaging findings of potential clinical significance: Aortic
Atherosclerosis ([id number]-89P.P). Coronary atherosclerosis. Elevated
left hemidiaphragm with atelectasis in the left lower lobe along the
diaphragm. Pneumobilia. Nonobstructive left nephrolithiasis.

## 2022-11-05 IMAGING — CT CT T SPINE W/O CM
3 series · 10 of 33 positions shown, 12 images · non-contrast
Comparison: Multiple exams, including CT abdomen 02/01/2020 and CT
chest 05/12/2013

CLINICAL DATA: Mid and lower back pain

EXAM:
CT THORACIC AND LUMBAR SPINE WITHOUT CONTRAST
TECHNIQUE: Multidetector CT imaging of the thoracic and lumbar spine was
performed without contrast. Multiplanar CT image reconstructions
were also generated.
RADIATION DOSE REDUCTION: This exam was performed according to the
departmental dose-optimization program which includes automated
exposure control, adjustment of the mA and/or kV according to
patient size and/or use of iterative reconstruction technique.

[Series 5: t-spine 2.0 st · axial · 0.48mm/px · z∈[+1334,+1476]mm · 2 of 155 slices shown, 3 images]
[im 48/155  soft-tissue]
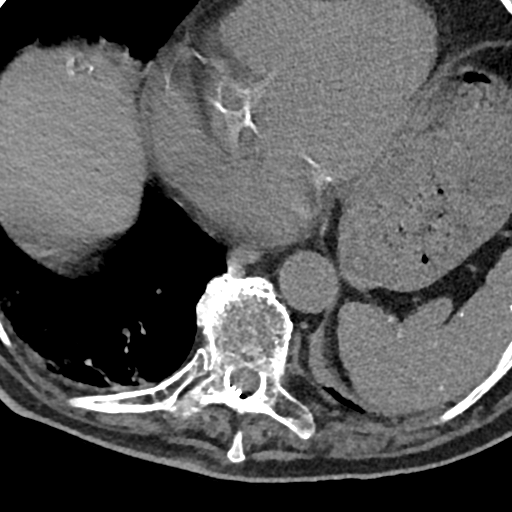
[im 48/155  bone]
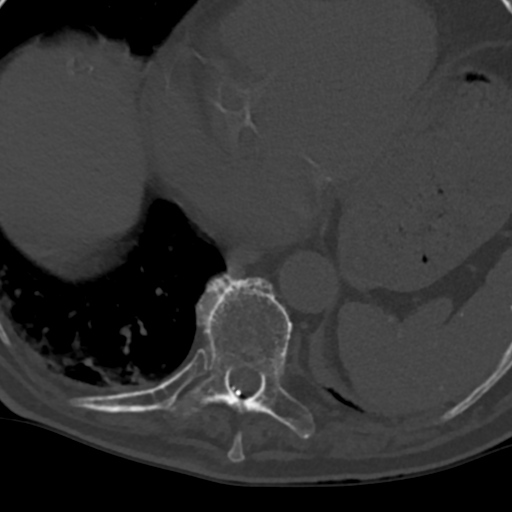
[im 119/155  bone]
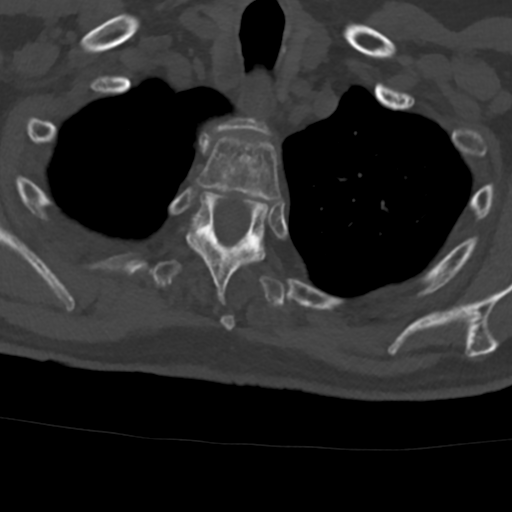

[Series 6: coronal bone · coronal · 0.34mm/px · 3 of 123 slices shown]
[im 25/123  bone]
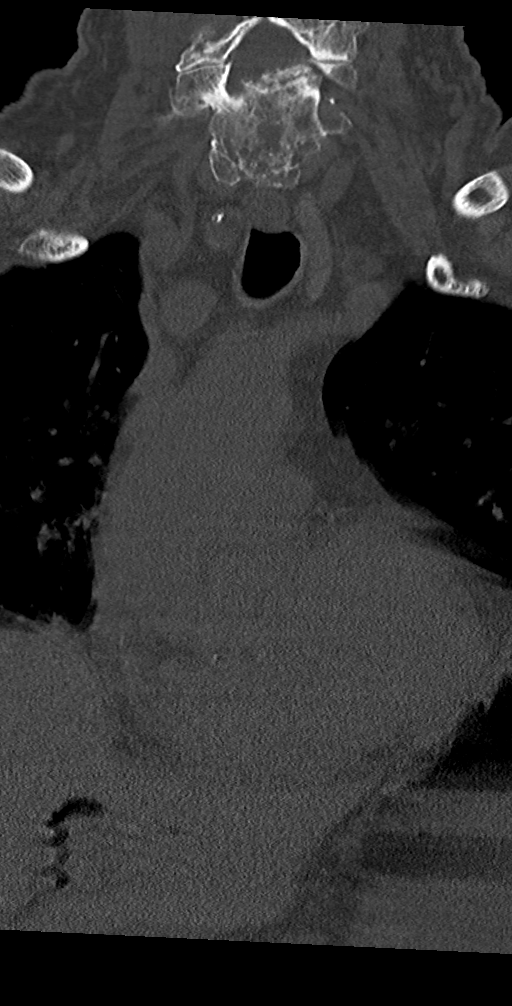
[im 49/123  bone]
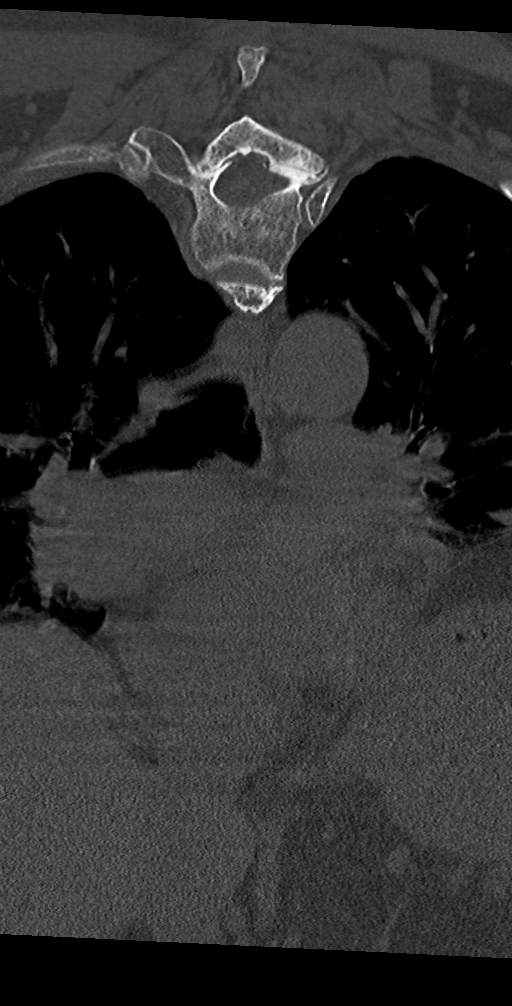
[im 74/123  bone]
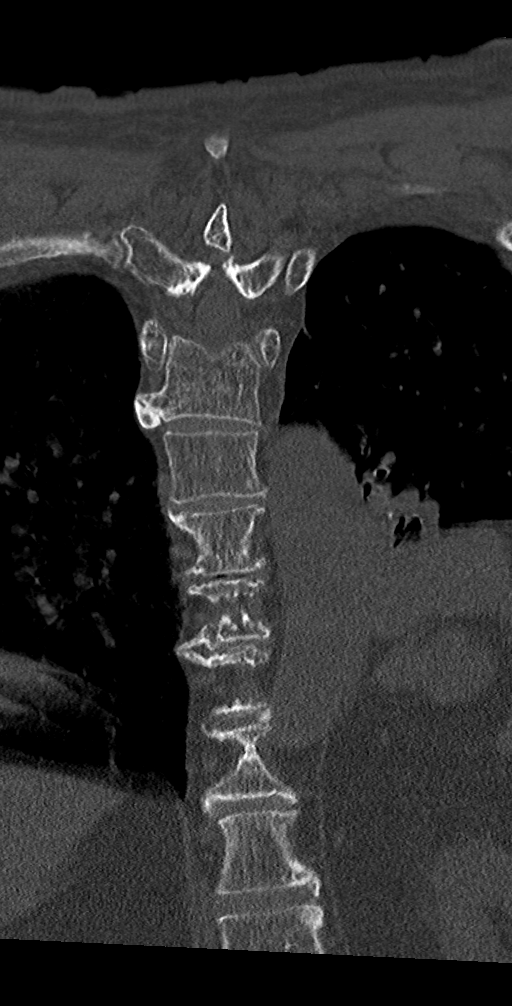

[Series 7: sagittal bone · sagittal · 0.49mm/px · 5 of 80 slices shown, 6 images]
[im 27/80  bone]
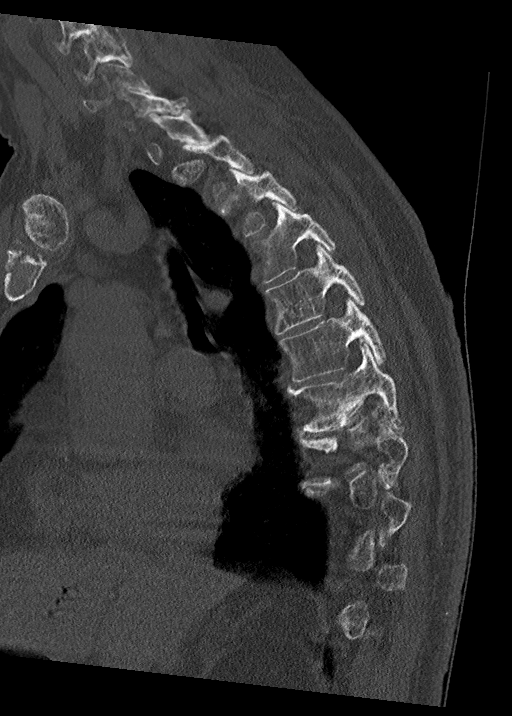
[im 33/80  bone]
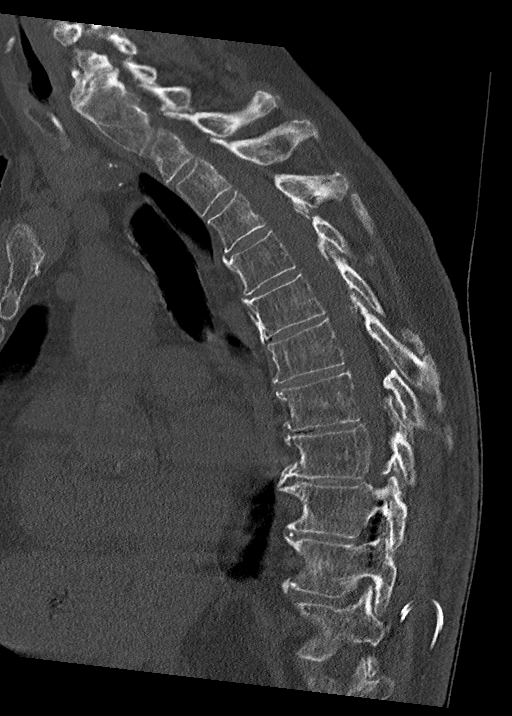
[im 40/80  soft-tissue]
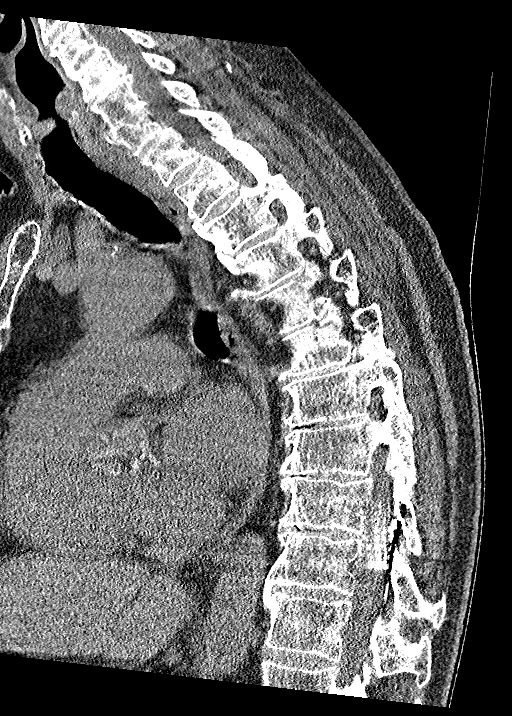
[im 40/80  bone]
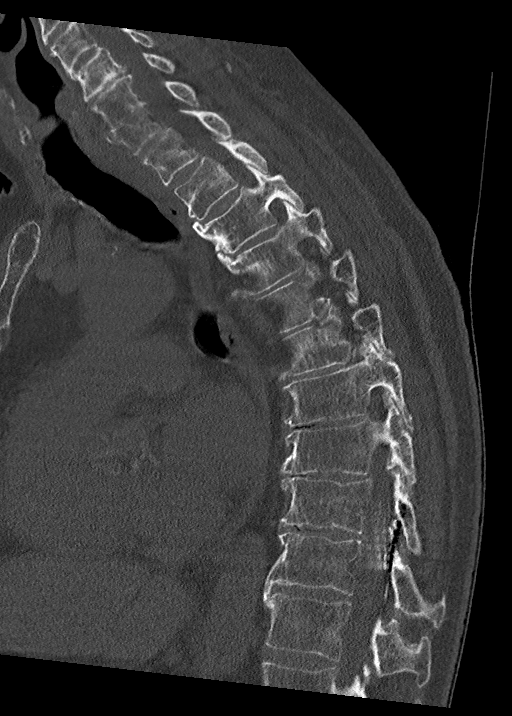
[im 47/80  bone]
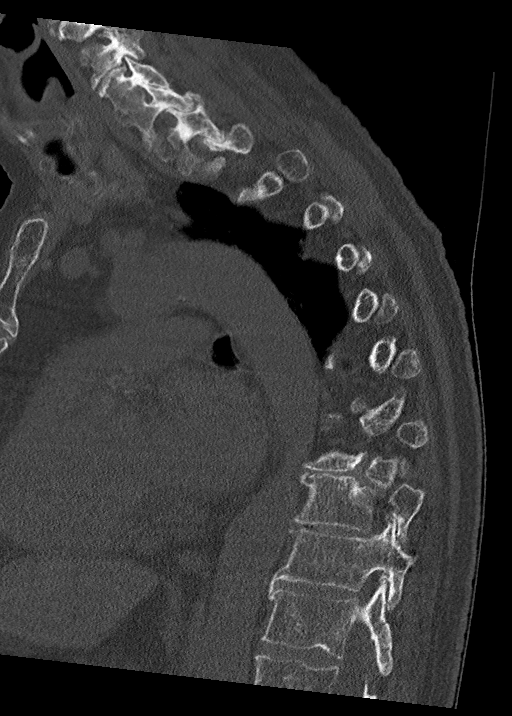
[im 53/80  bone]
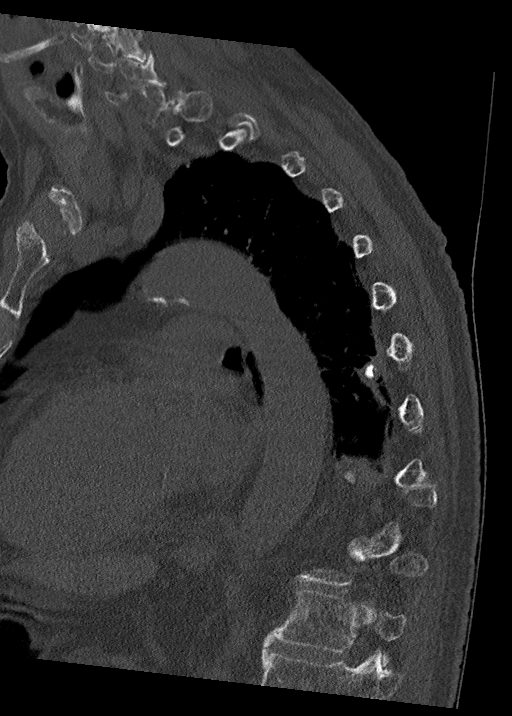

[10 of 33 positions shown; findings below may reference images not displayed]

FINDINGS: CT THORACIC SPINE FINDINGS

Alignment: Mild thoracic kyphosis. No subluxation in the thoracic
spine.

Vertebrae: Interbody fusion at C6-7. No thoracic spine fracture or
acute bony findings identified.

Paraspinal and other soft tissues: Coronary, aortic arch, and branch
vessel atherosclerotic vascular disease. Elevated left
hemidiaphragm. Old granulomatous disease involving the lungs and
spleen. 0.8 cm prevascular lymph node on image 56 series 5.
Atelectasis along the left hemidiaphragm. Pneumobilia.

Disc levels:

C5-6: Prominent bilateral foraminal impingement due to uncinate
spurring.

C6-7: Moderate bilateral foraminal stenosis due to spurring along
the intervertebral fusion site.

C7-T1: No impingement.  Degenerative facet arthropathy.

T1-2: No impingement.  Degenerative left facet arthropathy.

T2-3: Unremarkable.

T3-4: Unremarkable.

T4-5: Unremarkable.

T5-6: Unremarkable.

T6-7: Unremarkable.

T7-8: Unremarkable.

T8-9: Mild bilateral foraminal stenosis due to degenerative facet
arthropathy.

T9-10: Unremarkable. The lead to the dorsal column stimulator are at
the T9 and T10 level.

T10-11: Unremarkable.

T11-12: Unremarkable.

CT LUMBAR SPINE FINDINGS

Segmentation: The lowest lumbar type non-rib-bearing vertebra is
labeled as L5.

Alignment: 4 mm degenerative retrolisthesis at L2-3. Mild levoconvex
lumbar scoliosis with rotary component.

Vertebrae: No fracture or acute bony findings. Mild bridging
spurring of the sacroiliac joints. Loss of disc height at the L2-3
level.

Paraspinal and other soft tissues: Atherosclerosis is present,
including aortoiliac atherosclerotic disease. Nonobstructive left
nephrolithiasis.

Disc levels: T12-L1: Unremarkable.

L1-2: Unremarkable.

L2-3: Mild bilateral foraminal stenosis due to intervertebral
spurring and degenerative facet arthropathy.

L3-4: Suspected prominent central narrowing of the thecal sac and
mild bilateral foraminal stenosis due to disc bulge, facet
arthropathy, ligamentum flavum redundancy, and intervertebral
spurring.

L4-5: Suspected prominent central narrowing of the thecal sac and
mild bilateral foraminal stenosis again due to facet arthropathy,
ligamentum flavum redundancy, disc bulge, and spurring.

L5-S1: Moderate left and mild right foraminal stenosis due to facet
spurring.
IMPRESSION: CT THORACIC SPINE IMPRESSION

1. Mild thoracic kyphosis; spondylosis and degenerative disc disease
lead to substantial foraminal impingement at C5-6; moderate
bilateral foraminal impingement at C6-7; and mild bilateral
foraminal impingement at T8-9.
2. The dorsal column stimulator leads are at the T9 and T10 level.

CT LUMBAR SPINE IMPRESSION

1. Lumbar spondylosis and degenerative disc disease causing
prominent impingement at L3-4 and L4-5; moderate impingement at
L5-S1; and mild impingement at L2-3, as detailed above.
2. 4 mm degenerative retrolisthesis at L2-3.
3. Other imaging findings of potential clinical significance: Aortic
Atherosclerosis ([id number].P). Coronary atherosclerosis. Elevated
left hemidiaphragm with atelectasis in the left lower lobe along the
diaphragm. Pneumobilia. Nonobstructive left nephrolithiasis.

## 2022-11-05 IMAGING — CT CT HEAD W/O CM
3 series · 17 of 37 positions shown, 19 images · non-contrast
Comparison: CT head and CTA head and neck yesterday.

CLINICAL DATA: 80-year-old male status post code stroke
presentation yesterday.



[Series 3: head wo · axial · 0.44mm/px · z∈[-220,-95]mm · 7 of 35 slices shown, 9 images]
[im 5/35  brain]
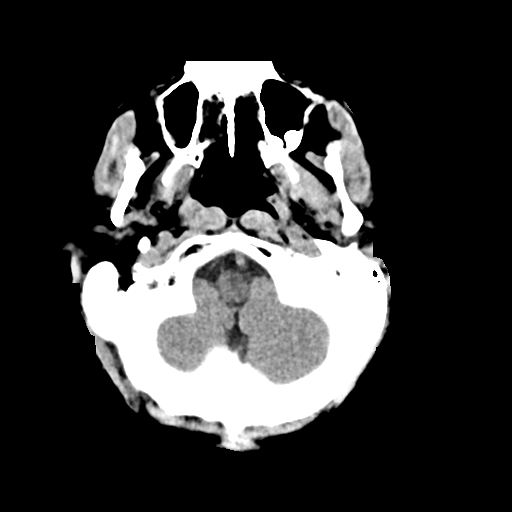
[im 5/35  bone]
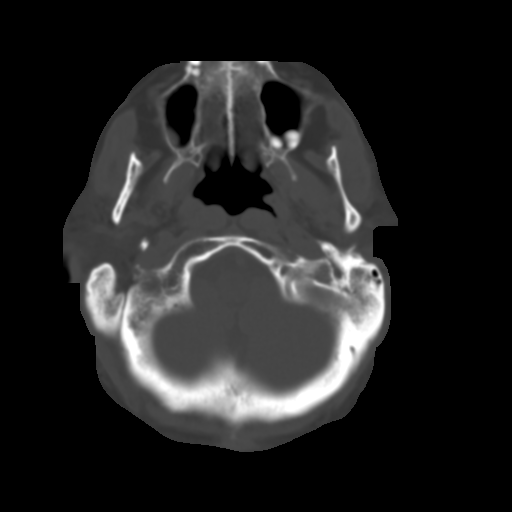
[im 9/35  brain]
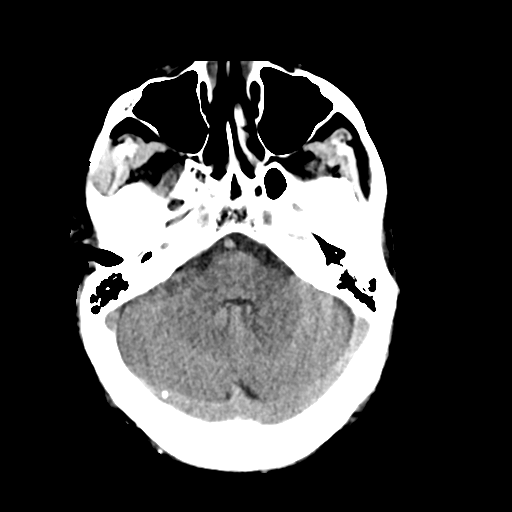
[im 13/35  brain]
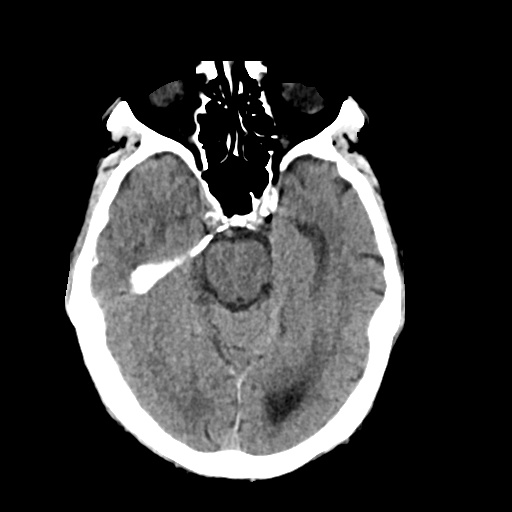
[im 18/35  brain]
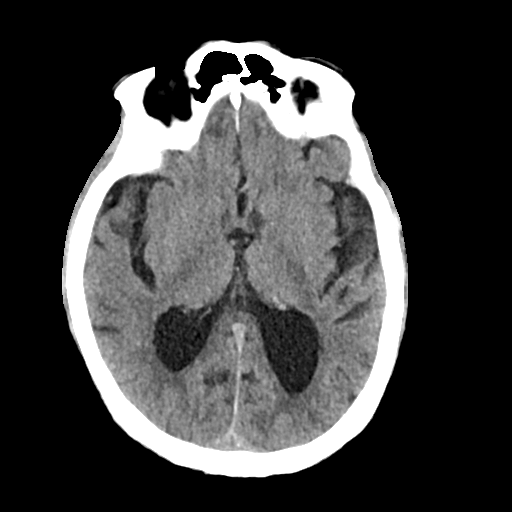
[im 22/35  brain]
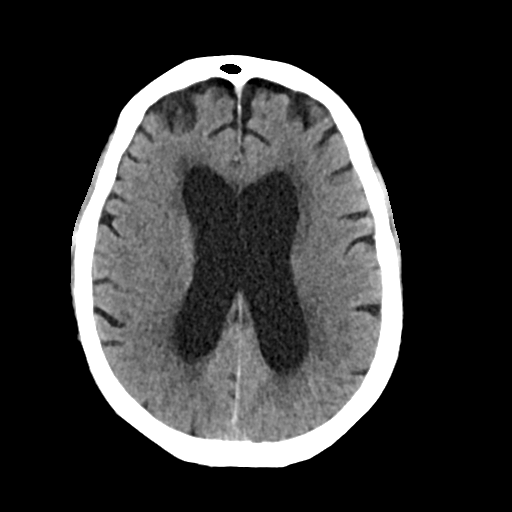
[im 22/35  bone]
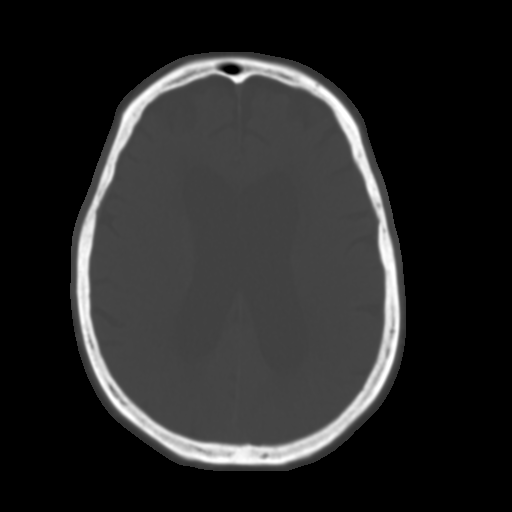
[im 26/35  brain]
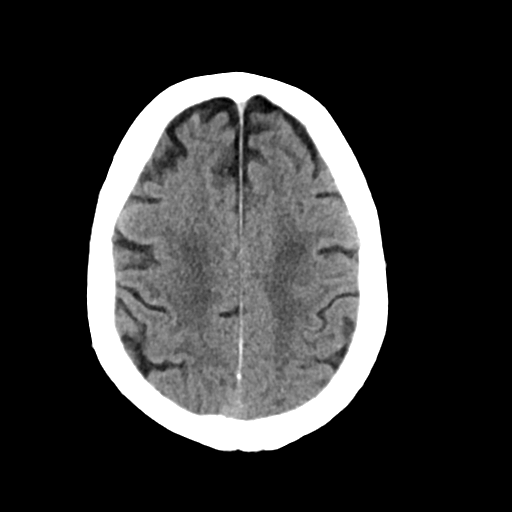
[im 30/35  brain]
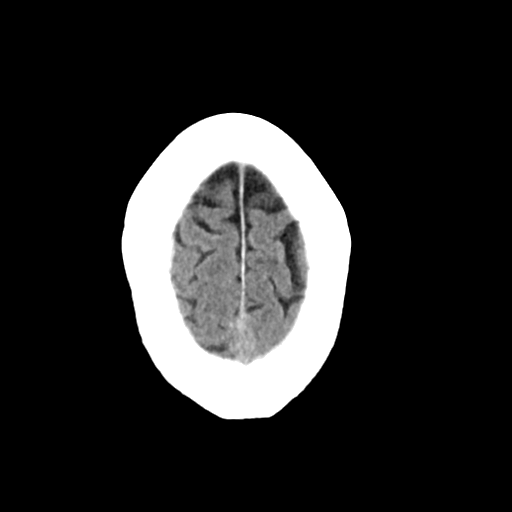

[Series 4: head bone · axial · 0.44mm/px · z∈[-224,-104]mm · 7 of 87 slices shown]
[im 9/87  bone]
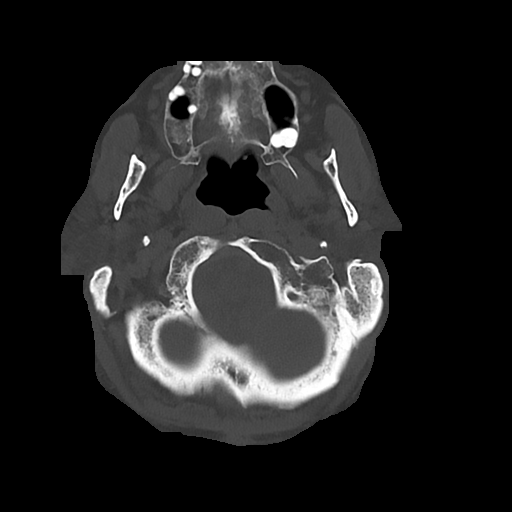
[im 18/87  bone]
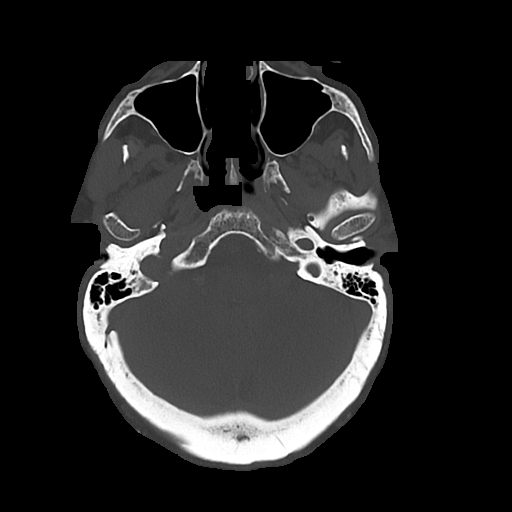
[im 26/87  bone]
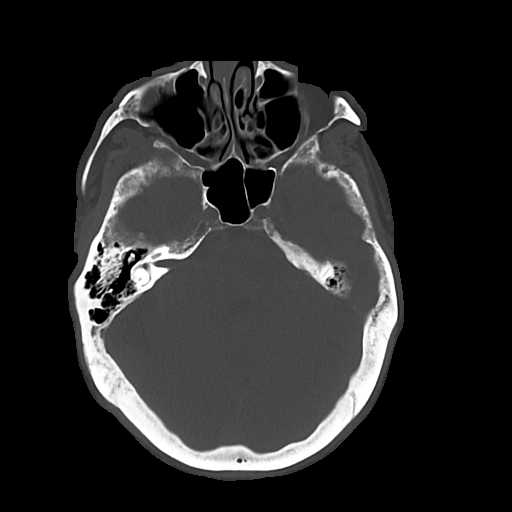
[im 39/87  bone]
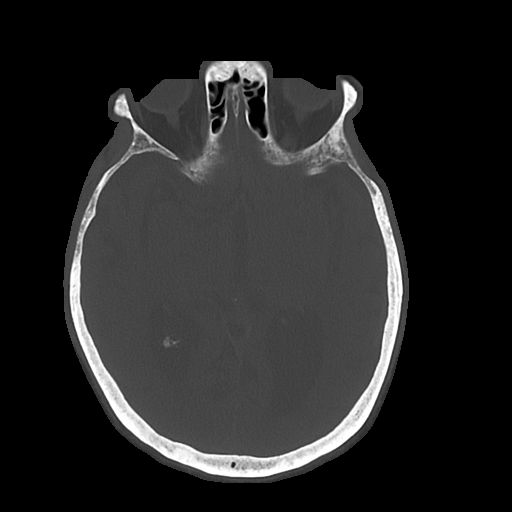
[im 48/87  bone]
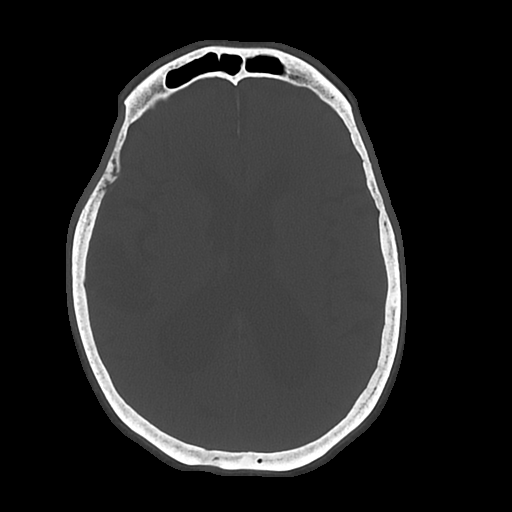
[im 61/87  bone]
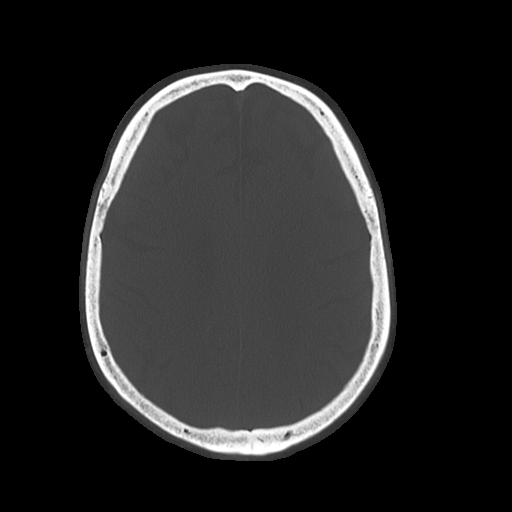
[im 69/87  bone]
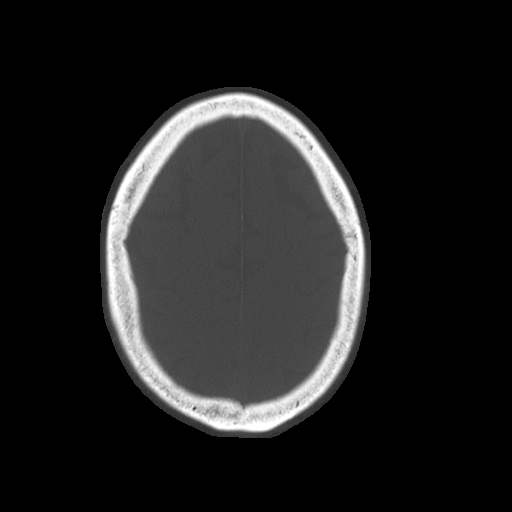

[Series 6: sag soft · sagittal · 0.35mm/px · 3 of 61 slices shown]
[im 21/61  brain]
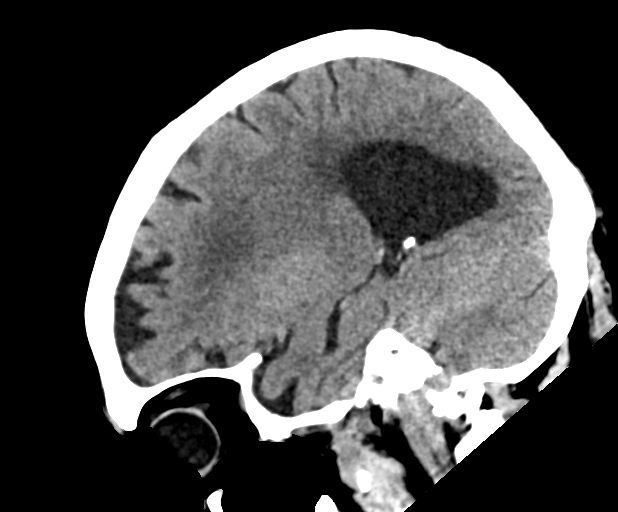
[im 31/61  brain]
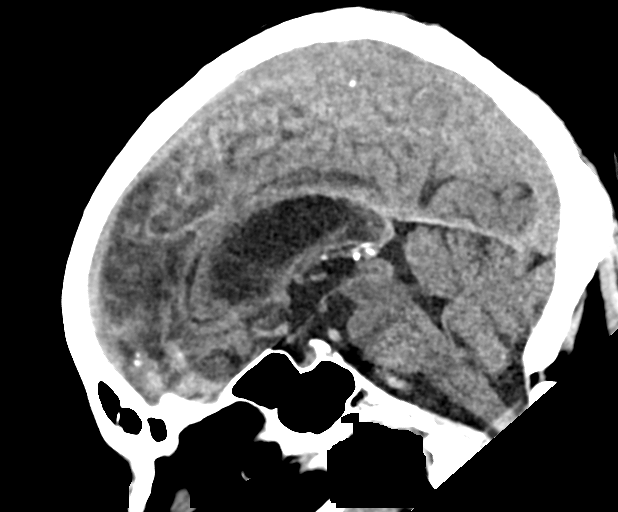
[im 41/61  brain]
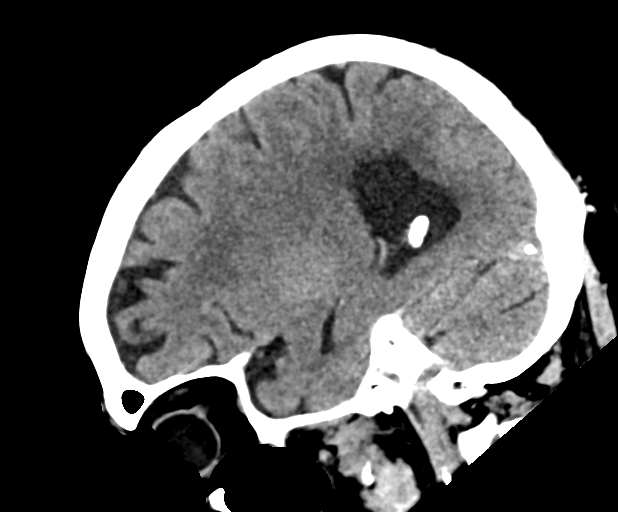

[17 of 37 positions shown; findings below may reference images not displayed]

FINDINGS: Brain: Stable non contrast CT appearance of the brain. Mild
nonspecific ventriculomegaly and patchy, confluent periventricular
white matter hypodensity. No midline shift, mass effect, or evidence
of intracranial mass lesion. No acute intracranial hemorrhage
identified. No cortically based acute infarct identified.

Vascular: Calcified atherosclerosis at the skull base.

Skull: No acute osseous abnormality identified.

Sinuses/Orbits: Visualized paranasal sinuses and mastoids are stable
and well aerated.

Other: No acute orbit or scalp soft tissue finding.
IMPRESSION: Stable non contrast CT appearance of the brain. No acute
intracranial hemorrhage or acute cortically based infarct
identified.

## 2023-02-14 DIAGNOSIS — B001 Herpesviral vesicular dermatitis: Secondary | ICD-10-CM | POA: Insufficient documentation

## 2023-02-14 DIAGNOSIS — W57XXXA Bitten or stung by nonvenomous insect and other nonvenomous arthropods, initial encounter: Secondary | ICD-10-CM | POA: Insufficient documentation

## 2023-02-18 DIAGNOSIS — L219 Seborrheic dermatitis, unspecified: Secondary | ICD-10-CM | POA: Insufficient documentation

## 2023-08-19 DIAGNOSIS — R600 Localized edema: Secondary | ICD-10-CM | POA: Insufficient documentation

## 2023-08-25 ENCOUNTER — Encounter (HOSPITAL_BASED_OUTPATIENT_CLINIC_OR_DEPARTMENT_OTHER): Payer: Self-pay | Admitting: Emergency Medicine

## 2023-08-25 ENCOUNTER — Ambulatory Visit (HOSPITAL_BASED_OUTPATIENT_CLINIC_OR_DEPARTMENT_OTHER)
Admission: EM | Admit: 2023-08-25 | Discharge: 2023-08-25 | Disposition: A | Discharge status: Home / Self Care | Payer: Medicare HMO | Attending: Internal Medicine | Admitting: Internal Medicine

## 2023-08-25 DIAGNOSIS — J029 Acute pharyngitis, unspecified: Secondary | ICD-10-CM | POA: Diagnosis not present

## 2023-08-25 MED ORDER — AMOXICILLIN 500 MG PO CAPS: 500.0000 mg | ORAL_CAPSULE | Freq: Three times a day (TID) | ORAL | 0 refills | Status: DC

## 2023-08-25 MED ORDER — AMOXICILLIN 500 MG PO CAPS: 500.0000 mg | ORAL_CAPSULE | Freq: Three times a day (TID) | ORAL | 0 refills | Status: AC

## 2023-08-25 NOTE — ED Provider Notes (Addendum)
[name]    CSN: [id number] Arrival date & time: 08/25/23  1515      History   Chief Complaint No chief complaint on file.   HPI Brandon [name] is a 82 y.o. male.   HPI Sore throat for 4 days having a lot of postnasal drip, and itching in his left ear.  Admits occasional cough secondary to the drainage. Seen in the urgent care center 2 days ago had a rapid strep which was negative. Denies fever, chills, sweats, chest pain, shortness of breath, nausea, vomiting, diarrhea, drainage from ear  Past Medical History:  Diagnosis Date   Benign prostatic hyperplasia (BPH) with urinary urgency    Bile duct stone    Cervical disc disease    CHF (congestive heart failure) (HCC)    Chronic kidney disease (CKD), stage III (moderate) (HCC)    pt unaware   Coronary artery disease    pt unaware   Diabetes mellitus without complication (HCC)    Diabetes, polyneuropathy (HCC)    Diastolic dysfunction    ED (erectile dysfunction)    Edema    Elevated hemidiaphragm    Elevated liver function tests    Gait abnormality    Gallstones    GERD (gastroesophageal reflux disease)    Gout    History of anticoagulant therapy    History of rib fracture    Hyperlipidemia    Hypertension    Lung mass    Major depression    Malaise and fatigue    Mouth pain    OA (osteoarthritis)    Pneumonia    history of   Prostate disease    Pulmonary emboli (HCC)    Sleep apnea    does not use CPAP, resolved   Stool incontinence    Testosterone deficiency    Vitamin B 12 deficiency    Weight loss     Patient Active Problem List   Diagnosis Date Noted   Lower extremity weakness 09/21/2021   Acute metabolic encephalopathy 09/21/2021   Hypertensive urgency 09/21/2021   Trigger middle finger of left hand 02/14/2020   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   Vitamin D deficiency 05/21/2019   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   Pulmonary embolism on  long-term anticoagulation therapy (HCC) 12/29/2018   DM (diabetes mellitus) (HCC) 12/29/2018   Chronic diastolic CHF (congestive heart failure) (HCC) 12/28/2018   Choledocholithiasis 12/28/2018   CKD (chronic kidney disease) stage 3, GFR 30-59 ml/min (HCC) 07/30/2018   Class 1 obesity due to excess calories with serious comorbidity and body mass index (BMI) of 30.0 to 30.9 in adult 01/07/2018   Benign prostatic hyperplasia with urinary frequency 12/02/2017   Balance disorder 08/22/2017   History of pulmonary embolism 02/19/2017   Hammertoe of right foot 05/16/2016   Callus of foot 05/16/2016   Diabetic polyneuropathy associated with type 2 diabetes mellitus (HCC) 04/16/2016   Onychomycosis due to dermatophyte 04/16/2016   Right foot pain 04/16/2016   Primary osteoarthritis involving multiple joints 04/15/2016   Elevated hemidiaphragm 01/24/2016   Gait abnormality 01/24/2016   Essential hypertension 11/22/2015   Anticoagulant long-term use 11/22/2015   Cervical disc disease 11/22/2015   Gastroesophageal reflux disease 11/22/2015   Gout 11/22/2015   High risk medication use 11/22/2015   Lung mass 11/22/2015   Major depressive disorder with current active episode 11/22/2015   Mixed hyperlipidemia 11/22/2015   Malaise and fatigue 11/22/2015   Testosterone deficiency 11/22/2015   Vitamin B12 deficiency 11/22/2015  Erectile dysfunction 11/22/2015   Edema 11/22/2015    Past Surgical History:  Procedure Laterality Date   BILIARY DILATION  01/22/2019   Procedure: BILIARY DILATION;  Surgeon: Vida Rigger, MD;  Location: [facility];  Service: Endoscopy;;   BILIARY STENT PLACEMENT N/A 01/01/2019   Procedure: BILIARY STENT PLACEMENT;  Surgeon: Willis Modena, MD;  Location: [facility];  Service: Endoscopy;  Laterality: N/A;   BILIARY STENT PLACEMENT N/A 01/22/2019   Procedure: BILIARY STENT PLACEMENT;  Surgeon: Vida Rigger, MD;  Location: [facility];  Service: Endoscopy;  Laterality:  N/A;   CARDIAC CATHETERIZATION  2014   ERCP N/A 01/01/2019   Procedure: ENDOSCOPIC RETROGRADE CHOLANGIOPANCREATOGRAPHY (ERCP);  Surgeon: Willis Modena, MD;  Location: [facility];  Service: Endoscopy;  Laterality: N/A;   ERCP N/A 01/22/2019   Procedure: ENDOSCOPIC RETROGRADE CHOLANGIOPANCREATOGRAPHY (ERCP);  Surgeon: Vida Rigger, MD;  Location: [facility];  Service: Endoscopy;  Laterality: N/A;  with spyglass and lithrotipsy   ERCP N/A 02/18/2019   Procedure: ENDOSCOPIC RETROGRADE CHOLANGIOPANCREATOGRAPHY (ERCP);  Surgeon: Vida Rigger, MD;  Location: [facility];  Service: Endoscopy;  Laterality: N/A;  WITH SPYGLASS    LAPAROSCOPIC CHOLECYSTECTOMY SINGLE SITE WITH INTRAOPERATIVE CHOLANGIOGRAM N/A 12/31/2018   Procedure: LAPAROSCOPIC CHOLECYSTECTOMY SINGLE SITE WITH INTRAOPERATIVE CHOLANGIOGRAM, REPAIR OF UMBILICAL HERNIA;  Surgeon: Karie Soda, MD;  Location: [facility];  Service: General;  Laterality: N/A;   REMOVAL OF STONES  01/01/2019   Procedure: REMOVAL OF STONES;  Surgeon: Willis Modena, MD;  Location: [facility];  Service: Endoscopy;;   REMOVAL OF STONES  01/22/2019   Procedure: REMOVAL OF STONES;  Surgeon: Vida Rigger, MD;  Location: [facility];  Service: Endoscopy;;   REMOVAL OF STONES  02/18/2019   Procedure: REMOVAL OF STONES;  Surgeon: Vida Rigger, MD;  Location: [facility];  Service: Endoscopy;;   right hip replacement     SPHINCTEROTOMY  01/01/2019   Procedure: Dennison Mascot;  Surgeon: Willis Modena, MD;  Location: [facility];  Service: Endoscopy;;   SPHINCTEROTOMY  01/22/2019   Procedure: Dennison Mascot;  Surgeon: Vida Rigger, MD;  Location: [facility];  Service: Endoscopy;;   SPHINCTEROTOMY  02/18/2019   Procedure: Dennison Mascot;  Surgeon: Vida Rigger, MD;  Location: [facility];  Service: Endoscopy;;   SPYGLASS CHOLANGIOSCOPY N/A 01/22/2019   Procedure: ZOXWRUEA CHOLANGIOSCOPY;  Surgeon: Vida Rigger, MD;  Location: [facility];  Service: Endoscopy;  Laterality: N/A;    SPYGLASS LITHOTRIPSY N/A 01/22/2019   Procedure: [id number] LITHOTRIPSY;  Surgeon: Vida Rigger, MD;  Location: [facility];  Service: Endoscopy;  Laterality: N/A;   STENT REMOVAL  01/22/2019   Procedure: STENT REMOVAL;  Surgeon: Vida Rigger, MD;  Location: [facility];  Service: Endoscopy;;   STENT REMOVAL  02/18/2019   Procedure: STENT REMOVAL;  Surgeon: Vida Rigger, MD;  Location: [facility];  Service: Endoscopy;;       Home Medications    Prior to Admission medications   Medication Sig Start Date End Date Taking? Authorizing Provider  ergocalciferol (VITAMIN D2) 1.25 MG (50000 UT) capsule Take 50,000 Units by mouth every Monday. 06/19/20  Yes [provider]  fenofibrate 160 MG tablet Take 160 mg by mouth at bedtime. 12/17/18  Yes [provider]  fosinopril (MONOPRIL) 10 MG tablet Take 10 mg by mouth daily. 09/28/18  Yes [provider]  oxyCODONE-acetaminophen (PERCOCET/ROXICET) 5-325 MG tablet Take 1-2 tablets by mouth every 6 (six) hours as needed for severe pain. 09/25/21  Yes Osvaldo Shipper, MD  vitamin B-12 1000 MCG tablet Take 1 tablet (1,000 mcg total) by  mouth daily. 09/28/21  Yes Osvaldo Shipper, MD  warfarin (COUMADIN) 5 MG tablet Take 1 tablet (5 mg total) by mouth daily. Patient taking differently: Take 5 mg by mouth every evening. 01/04/19  Yes Mikhail, Nita Sells, DO  DULoxetine (CYMBALTA) 30 MG capsule Take 30 mg by mouth every evening. 06/19/21   [provider]  enoxaparin (LOVENOX) 100 MG/ML injection Inject 1 mL (100 mg total) into the skin every 12 (twelve) hours. 09/25/21   Osvaldo Shipper, MD  omeprazole (PRILOSEC) 10 MG capsule Take 10 mg by mouth daily. 07/31/18   [provider]  oxybutynin (DITROPAN) 5 MG tablet Take 1 tablet (5 mg total) by mouth 2 (two) times daily. 09/25/21   Osvaldo Shipper, MD  polyethylene glycol (MIRALAX / GLYCOLAX) 17 g packet Take 17 g by mouth daily. 09/25/21   Osvaldo Shipper, MD  Probiotic  Product (ALIGN PO) Take 1 capsule by mouth daily.    [provider]  senna-docusate (SENOKOT-S) 8.6-50 MG tablet Take 2 tablets by mouth 2 (two) times daily. 09/25/21   Osvaldo Shipper, MD    Family History Family History  Problem Relation Age of Onset   Heart disease Father        Had heart surgery in his 22's    Social History Social History   Tobacco Use   Smoking status: Former    Current packs/day: 0.00    Average packs/day: 0.3 packs/day for 10.0 years (2.5 ttl pk-yrs)    Types: Cigarettes    Start date: 3    Quit date: 33    Years since quitting: 38.0   Smokeless tobacco: Never   Tobacco comments:    declined  Vaping Use   Vaping status: Never Used  Substance Use Topics   Alcohol use: Never   Drug use: Never     Allergies   Codeine, Statins, Allopurinol, and Doxycycline   Review of Systems Review of Systems  Constitutional:  Negative for chills, fatigue and fever.  HENT:  Positive for congestion, postnasal drip and sore throat. Negative for ear discharge, rhinorrhea, trouble swallowing and voice change.   Respiratory:  Positive for cough. Negative for shortness of breath.   Cardiovascular:  Negative for chest pain.  Skin:  Negative for rash.  Neurological:  Negative for headaches.     Physical Exam Triage Vital Signs ED Triage Vitals  Encounter Vitals Group     BP 08/25/23 1542 129/78     Systolic BP Percentile --      Diastolic BP Percentile --      Pulse Rate 08/25/23 1542 (!) 59     Resp 08/25/23 1542 20     Temp 08/25/23 1542 98.5 F (36.9 C)     Temp Source 08/25/23 1542 Oral     SpO2 08/25/23 1542 93 %     Weight --      Height --      Head Circumference --      Peak Flow --      Pain Score 08/25/23 1539 6     Pain Loc --      Pain Education --      Exclude from Growth Chart --    No data found.  Updated Vital Signs BP 129/78 (BP Location: Right Arm)   Pulse (!) 59   Temp 98.5 F (36.9 C) (Oral)   Resp 20   SpO2  93%   Visual Acuity Right Eye Distance:   Left Eye Distance:   Bilateral Distance:  Right Eye Near:   Left Eye Near:    Bilateral Near:     Physical Exam Vitals reviewed.  Constitutional:      Appearance: He is not ill-appearing.  HENT:     Head: Normocephalic and atraumatic.     Right Ear: Tympanic membrane and ear canal normal.     Left Ear: Tympanic membrane and ear canal normal.     Nose: No rhinorrhea.     Mouth/Throat:     Pharynx: Posterior oropharyngeal erythema present. No oropharyngeal exudate.  Cardiovascular:     Rate and Rhythm: Normal rate.     Heart sounds: Normal heart sounds.  Pulmonary:     Effort: Pulmonary effort is normal.     Breath sounds: Normal breath sounds.  Musculoskeletal:     Cervical back: Neck supple.  Lymphadenopathy:     Cervical: No cervical adenopathy.  Skin:    General: Skin is warm and dry.  Neurological:     Mental Status: He is alert and oriented to person, place, and time.      UC Treatments / Results  Labs (all labs ordered are listed, but only abnormal results are displayed) Labs Reviewed - No data to display  EKG   Radiology No results found.  Procedures Procedures (including critical care time)  Medications Ordered in UC Medications - No data to display  Initial Impression / Assessment and Plan / UC Course  I have reviewed the triage vital signs and the nursing notes.  Pertinent labs & imaging results that were available during my care of the patient were reviewed by me and considered in my medical decision making (see chart for details).    Sore throat for 4 days getting worse associated with postnasal drip and left ear pain Recent point-of-care strep is negative Final Clinical Impressions(s) / UC Diagnoses   Final diagnoses:  None   Discharge Instructions   None    ED Prescriptions   None    PDMP not reviewed this encounter.   Meliton Rattan, PA 08/25/23 1601    Meliton Rattan,  [place] 08/25/23 1606

## 2023-08-25 NOTE — ED Triage Notes (Signed)
Pt c/o sore throat x 3-4 days. Pt has used throat lozenge but no improvement. Pt was seen at [facility] on Friday and they tested him for strep and it was negative.

## 2023-11-20 ENCOUNTER — Ambulatory Visit: Admitting: Podiatry

## 2023-11-25 ENCOUNTER — Ambulatory Visit: Admitting: Podiatry

## 2023-11-25 ENCOUNTER — Encounter: Payer: Self-pay | Admitting: Podiatry

## 2023-11-25 DIAGNOSIS — M79674 Pain in right toe(s): Secondary | ICD-10-CM

## 2023-11-25 DIAGNOSIS — E1142 Type 2 diabetes mellitus with diabetic polyneuropathy: Secondary | ICD-10-CM

## 2023-11-25 DIAGNOSIS — B351 Tinea unguium: Secondary | ICD-10-CM

## 2023-11-25 DIAGNOSIS — M79675 Pain in left toe(s): Secondary | ICD-10-CM | POA: Diagnosis not present

## 2023-11-25 DIAGNOSIS — L97511 Non-pressure chronic ulcer of other part of right foot limited to breakdown of skin: Secondary | ICD-10-CM | POA: Diagnosis not present

## 2023-11-25 MED ORDER — MUPIROCIN 2 % EX OINT: 1.0000 | TOPICAL_OINTMENT | Freq: Two times a day (BID) | CUTANEOUS | 0 refills | Status: AC

## 2023-11-25 NOTE — Progress Notes (Unsigned)
  Subjective:  Patient ID: Brandon [name]., male    DOB: 1940-11-23,  MRN: [id number]  Chief Complaint  Patient presents with   [facility]    [facility] with out callous. He his here to set up [facility] going forward and to get DB exam. His feet and legs are swollen, but cold to the touch.  He is having trouble walking. Last A1c was in Nov, it was 6.5. He takes plavix.    83 y.o. male presents with the above complaint. History confirmed with patient. Patient presenting with pain related to dystrophic thickened elongated nails. Patient is unable to trim own nails related to nail dystrophy and/or mobility issues. Patient does have a history of T2DM. Patient does have callus present located at the right first toe plantar aspect causing pain.   Objective:  Physical Exam: warm, good capillary refill, pedal hair growth absent, decreased skin texture and skin turgor nail exam onychomycosis of the toenails, onycholysis, and dystrophic nails Difficult to palpate pedal pulses due to edema of the foot is warm and well-perfused, protective sensation absent, +3 pitting edema present Left Foot:  Pain with palpation of nails due to elongation and dystrophic growth.  Right Foot: Pain with palpation of nails due to elongation and dystrophic growth.  Hallux malleus noted.  Hemorrhagic callus present with underlying partial-thickness skin breakdown  Assessment:   1. Pain due to onychomycosis of toenails of both feet   2. Diabetic polyneuropathy associated with type 2 diabetes mellitus (HCC)   3. Ulcer of great toe, right, limited to breakdown of skin [facility])      Plan:  Patient was evaluated and treated and all questions answered.  # Hemorrhagic callus plantar tuft of right hallux with partial thickness skin breakdown All symptomatic hyperkeratoses x 1 separate lesions was sharply debrided with a sterile # 312 blade.  Partial-thickness skin breakdown noted.  Antibiotic ointment and Band-Aid applied.  Patient to change  dressing daily until healed.  Felt offloading padding applied.  Mupirocin ointment ordered for patient to be applied daily. -Baseline ABIs ordered  #Onychomycosis with pain  -Nails palliatively debrided as below. -Educated on self-care  Procedure: Nail Debridement Rationale: Pain Type of Debridement: manual, sharp debridement. Instrumentation: Nail nipper, rotary burr. Number of Nails: 10  # Diabetes with neuropathy Patient educated on diabetes and neuropathy. Discussed proper diabetic foot care and discussed risks and complications of disease. Educated patient in depth on reasons to return to the office immediately should he/she discover anything concerning or new on the feet. All questions answered. Discussed proper shoes as well.   Regarding the patient's leg edema, he is seeing a provider for this later this week.   Return in about 2 weeks (around 12/09/2023) for Ulcer Check.         Bronwen Betters, DPM [facility]

## 2023-12-09 ENCOUNTER — Ambulatory Visit (INDEPENDENT_AMBULATORY_CARE_PROVIDER_SITE_OTHER): Admitting: Podiatry

## 2023-12-09 DIAGNOSIS — Z91199 Patient's noncompliance with other medical treatment and regimen due to unspecified reason: Secondary | ICD-10-CM

## 2023-12-11 NOTE — Progress Notes (Signed)
 Patient absent from appointment

## 2023-12-16 ENCOUNTER — Ambulatory Visit (HOSPITAL_BASED_OUTPATIENT_CLINIC_OR_DEPARTMENT_OTHER)
Admission: EM | Admit: 2023-12-16 | Discharge: 2023-12-16 | Disposition: A | Discharge status: Home / Self Care | Attending: Emergency Medicine | Admitting: Emergency Medicine

## 2023-12-16 ENCOUNTER — Encounter (HOSPITAL_BASED_OUTPATIENT_CLINIC_OR_DEPARTMENT_OTHER): Payer: Self-pay

## 2023-12-16 DIAGNOSIS — R9431 Abnormal electrocardiogram [ECG] [EKG]: Secondary | ICD-10-CM

## 2023-12-16 DIAGNOSIS — R0789 Other chest pain: Secondary | ICD-10-CM

## 2023-12-16 NOTE — ED Provider Notes (Signed)
 [name]    CSN: [id number] Arrival date & time: 12/16/23  1632      History   Chief Complaint Chief Complaint  Patient presents with   Chest Pain    HPI Brandon [name]. is a 83 y.o. male.  Here with several day history of chest pain. Burning and pressure sensation centrally. Worsening last day or so. This morning developed left arm weakness but it slowly resolved over the afternoon Currently having some tightness in the chest Denies shortness of breath  History of CAD, CHF, DM, CKD Pulmonary embolism on coumadin   Past Medical History:  Diagnosis Date   Benign prostatic hyperplasia (BPH) with urinary urgency    Bile duct stone    Cervical disc disease    CHF (congestive heart failure) (HCC)    Chronic kidney disease (CKD), stage III (moderate) (HCC)    pt unaware   Coronary artery disease    pt unaware   Diabetes mellitus without complication (HCC)    Diabetes, polyneuropathy (HCC)    Diastolic dysfunction    ED (erectile dysfunction)    Edema    Elevated hemidiaphragm    Elevated liver function tests    Gait abnormality    Gallstones    GERD (gastroesophageal reflux disease)    Gout    History of anticoagulant therapy    History of rib fracture    Hyperlipidemia    Hypertension    Lung mass    Major depression    Malaise and fatigue    Mouth pain    OA (osteoarthritis)    Pneumonia    history of   Prostate disease    Pulmonary emboli (HCC)    Sleep apnea    does not use CPAP, resolved   Stool incontinence    Testosterone deficiency    Vitamin B 12 deficiency    Weight loss     Patient Active Problem List   Diagnosis Date Noted   Lower extremity weakness 09/21/2021   Acute metabolic encephalopathy 09/21/2021   Hypertensive urgency 09/21/2021   Trigger middle finger of left hand 02/14/2020   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   Vitamin D deficiency 05/21/2019   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    Pulmonary embolism on long-term anticoagulation therapy (HCC) 12/29/2018   DM (diabetes mellitus) (HCC) 12/29/2018   Chronic diastolic CHF (congestive heart failure) (HCC) 12/28/2018   Choledocholithiasis 12/28/2018   CKD (chronic kidney disease) stage 3, GFR 30-59 ml/min (HCC) 07/30/2018   Class 1 obesity due to excess calories with serious comorbidity and body mass index (BMI) of 30.0 to 30.9 in adult 01/07/2018   Benign prostatic hyperplasia with urinary frequency 12/02/2017   Balance disorder 08/22/2017   History of pulmonary embolism 02/19/2017   Hammertoe of right foot 05/16/2016   Callus of foot 05/16/2016   Diabetic polyneuropathy associated with type 2 diabetes mellitus (HCC) 04/16/2016   Onychomycosis due to dermatophyte 04/16/2016   Right foot pain 04/16/2016   Primary osteoarthritis involving multiple joints 04/15/2016   Elevated hemidiaphragm 01/24/2016   Gait abnormality 01/24/2016   Essential hypertension 11/22/2015   Anticoagulant long-term use 11/22/2015   Cervical disc disease 11/22/2015   Gastroesophageal reflux disease 11/22/2015   Gout 11/22/2015   High risk medication use 11/22/2015   Lung mass 11/22/2015   Major depressive disorder with current active episode 11/22/2015   Mixed hyperlipidemia 11/22/2015   Malaise and fatigue 11/22/2015   Testosterone deficiency 11/22/2015   Vitamin  B12 deficiency 11/22/2015   Erectile dysfunction 11/22/2015   Edema 11/22/2015    Past Surgical History:  Procedure Laterality Date   BILIARY DILATION  01/22/2019   Procedure: BILIARY DILATION;  Surgeon: Vida Rigger, MD;  Location: [facility];  Service: Endoscopy;;   BILIARY STENT PLACEMENT N/A 01/01/2019   Procedure: BILIARY STENT PLACEMENT;  Surgeon: Willis Modena, MD;  Location: [facility];  Service: Endoscopy;  Laterality: N/A;   BILIARY STENT PLACEMENT N/A 01/22/2019   Procedure: BILIARY STENT PLACEMENT;  Surgeon: Vida Rigger, MD;  Location: [facility];  Service:  Endoscopy;  Laterality: N/A;   CARDIAC CATHETERIZATION  2014   ERCP N/A 01/01/2019   Procedure: ENDOSCOPIC RETROGRADE CHOLANGIOPANCREATOGRAPHY (ERCP);  Surgeon: Willis Modena, MD;  Location: [facility];  Service: Endoscopy;  Laterality: N/A;   ERCP N/A 01/22/2019   Procedure: ENDOSCOPIC RETROGRADE CHOLANGIOPANCREATOGRAPHY (ERCP);  Surgeon: Vida Rigger, MD;  Location: [facility];  Service: Endoscopy;  Laterality: N/A;  with spyglass and lithrotipsy   ERCP N/A 02/18/2019   Procedure: ENDOSCOPIC RETROGRADE CHOLANGIOPANCREATOGRAPHY (ERCP);  Surgeon: Vida Rigger, MD;  Location: [facility];  Service: Endoscopy;  Laterality: N/A;  WITH SPYGLASS    LAPAROSCOPIC CHOLECYSTECTOMY SINGLE SITE WITH INTRAOPERATIVE CHOLANGIOGRAM N/A 12/31/2018   Procedure: LAPAROSCOPIC CHOLECYSTECTOMY SINGLE SITE WITH INTRAOPERATIVE CHOLANGIOGRAM, REPAIR OF UMBILICAL HERNIA;  Surgeon: Karie Soda, MD;  Location: [facility];  Service: General;  Laterality: N/A;   REMOVAL OF STONES  01/01/2019   Procedure: REMOVAL OF STONES;  Surgeon: Willis Modena, MD;  [facility]: [facility];  Service: Endoscopy;;   REMOVAL OF STONES  01/22/2019   Procedure: REMOVAL OF STONES;  Surgeon: Vida Rigger, MD;  [facility];  Service: Endoscopy;;   REMOVAL OF STONES  02/18/2019   Procedure: REMOVAL OF STONES;  Surgeon: Vida Rigger, MD;  [facility];  Service: Endoscopy;;   right hip replacement     SPHINCTEROTOMY  01/01/2019   Procedure: Dennison Mascot;  Surgeon: Willis Modena, MD;  [facility];  Service: Endoscopy;;   SPHINCTEROTOMY  01/22/2019   Procedure: Dennison Mascot;  Surgeon: Vida Rigger, MD;  [facility];  Service: Endoscopy;;   SPHINCTEROTOMY  02/18/2019   Procedure: Dennison Mascot;  Surgeon: Vida Rigger, MD;  [facility];  Service: Endoscopy;;   SPYGLASS CHOLANGIOSCOPY N/A 01/22/2019   Procedure: [id number] CHOLANGIOSCOPY;  Surgeon: Vida Rigger, MD;  [facility];  Service:  Endoscopy;  Laterality: N/A;   SPYGLASS LITHOTRIPSY N/A 01/22/2019   Procedure: YQMVHQIO LITHOTRIPSY;  Surgeon: Vida Rigger, MD;  [facility];  Service: Endoscopy;  Laterality: N/A;   STENT REMOVAL  01/22/2019   Procedure: STENT REMOVAL;  Surgeon: Vida Rigger, MD;  [facility];  Service: Endoscopy;;   STENT REMOVAL  02/18/2019   Procedure: STENT REMOVAL;  Surgeon: Vida Rigger, MD;  [facility]: [facility];  Service: Endoscopy;;       Home Medications    Prior to Admission medications   Medication Sig Start Date End Date Taking? Authorizing Provider  acetaminophen (TYLENOL) 325 MG tablet Give 3 tablets 10/31/21   [provider]  amoxicillin (AMOXIL) 500 MG capsule Take 1 capsule (500 mg total) by mouth 3 (three) times daily. Patient not taking: Reported on 11/25/2023 08/25/23   Meliton Rattan, PA  clotrimazole-betamethasone (LOTRISONE) cream Apply sparingly to rash twice daily as needed. 12/11/21   [provider]  DULoxetine (CYMBALTA) 30 MG capsule Take 30 mg by mouth every evening. 06/19/21   [provider]  ergocalciferol (VITAMIN D2) 1.25 MG (50000 UT) capsule Take  50,000 Units by mouth every Monday. 06/19/20   [provider]  fenofibrate 160 MG tablet Take 160 mg by mouth at bedtime. 12/17/18   [provider]  finasteride (PROSCAR) 5 MG tablet Take 1 tablet by mouth daily. 04/09/22   [provider]  fosinopril (MONOPRIL) 10 MG tablet Take 10 mg by mouth daily. 09/28/18   [provider]  furosemide (LASIX) 40 MG tablet Take by mouth. 09/09/23 09/08/24  [provider]  omeprazole (PRILOSEC) 10 MG capsule Take 10 mg by mouth daily. 07/31/18   [provider]  oxybutynin (DITROPAN) 5 MG tablet Take 1 tablet (5 mg total) by mouth 2 (two) times daily. 09/25/21   Krishnan, Gokul, MD  polyethylene glycol (MIRALAX / GLYCOLAX) 17 g packet Take 17 g by mouth daily. 09/25/21   Krishnan, Gokul, MD   Probiotic Product (ALIGN PO) Take 1 capsule by mouth daily.    [provider]  senna-docusate (SENOKOT-S) 8.6-50 MG tablet Take 2 tablets by mouth 2 (two) times daily. 09/25/21   Krishnan, Gokul, MD  vitamin B-12 1000 MCG tablet Take 1 tablet (1,000 mcg total) by mouth daily. 09/28/21   Krishnan, Gokul, MD  warfarin (COUMADIN) 5 MG tablet Take 1 tablet (5 mg total) by mouth daily. Patient taking differently: Take 5 mg by mouth every evening. 01/04/19   Abron Abt, DO    Family History Family History  Problem Relation Age of Onset   Heart disease Father        Had heart surgery in his 31's    Social History Social History   Tobacco Use   Smoking status: Former    Current packs/day: 0.00    Average packs/day: 0.3 packs/day for 10.0 years (2.5 ttl pk-yrs)    Types: Cigarettes    Start date: 57    Quit date: 58    Years since quitting: 38.3   Smokeless tobacco: Never   Tobacco comments:    declined  Vaping Use   Vaping status: Never Used  Substance Use Topics   Alcohol use: Never   Drug use: Never     Allergies   Codeine, Statins, Allopurinol, and Doxycycline   Review of Systems Review of Systems Per HPI  Physical Exam Triage Vital Signs ED Triage Vitals  Encounter Vitals Group     BP      Systolic BP Percentile      Diastolic BP Percentile      Pulse      Resp      Temp      Temp src      SpO2      Weight      Height      Head Circumference      Peak Flow      Pain Score      Pain Loc      Pain Education      Exclude from Growth Chart    No data found.  Updated Vital Signs BP (!) 145/78 (BP Location: Right Arm)   Pulse 78   Temp 97.9 F (36.6 C) (Oral)   Resp 18   SpO2 93%    Physical Exam Constitutional:      Appearance: He is not ill-appearing or diaphoretic.  HENT:     Mouth/Throat:     Mouth: Mucous membranes are moist.     Pharynx: Oropharynx is clear.  Eyes:     Conjunctiva/sclera: Conjunctivae normal.   Cardiovascular:     Rate  and Rhythm: Normal rate and regular rhythm.     Pulses: Normal pulses.     Heart sounds: Normal heart sounds.  Pulmonary:     Effort: Pulmonary effort is normal.     Breath sounds: Normal breath sounds.  Abdominal:     Comments: Large abdominal habitus  Musculoskeletal:     Cervical back: Normal range of motion.  Skin:    General: Skin is warm and dry.  Neurological:     Mental Status: He is oriented to person, place, and time. Mental status is at baseline.     Comments: Ambulates with cane      UC Treatments / Results  Labs (all labs ordered are listed, but only abnormal results are displayed) Labs Reviewed - No data to display  EKG  Radiology No results found.  Procedures Procedures   Medications Ordered in UC Medications - No data to display  Initial Impression / Assessment and Plan / UC Course  I have reviewed the triage vital signs and the nursing notes.  Pertinent labs & imaging results that were available during my care of the patient were reviewed by me and considered in my medical decision making (see chart for details).  Overall stable vitals EKG sinus arhythmia with ventricular rate 77 bpm There are nonspecific ST changes - elevation in leads V1 V3 and V6, depression lead II. These are new findings compared to prior reading in 09/2021. With patient EKG changes, age, past medical history, chest tightness, I have recommended evaluation in the emergency department. He wanted to wait until his "heart visit" later this week but I have advised getting evaluation and lab work [employer]. Declines ambulance transport. Patient wife is agreeable to transporting him to ED now  Final Clinical Impressions(s) / UC Diagnoses   Final diagnoses:  EKG, abnormal  Chest tightness   Discharge Instructions   None    ED Prescriptions   None    PDMP not reviewed this encounter.   Khori Underberg, Ivette Marks, [place] 12/16/23 1735

## 2023-12-16 NOTE — ED Triage Notes (Signed)
 Pt c/o center chest pain/epigastric pain/burning since last week. States took benafiber this am with no relief. States past few nights feels like his throat has needles in it. States lt arm weakness this am. States appt with heart dr. On Monday.

## 2023-12-16 NOTE — ED Notes (Signed)
 Patient is being discharged from the Urgent Care and sent to the Emergency Department via POV . Per Ivette Marks, [place], patient is in need of higher level of care due to Chest Pain. Patient is aware and verbalizes understanding of plan of care.  Vitals:   12/16/23 1656  BP: (!) 145/78  Pulse: 78  Resp: 18  Temp: 97.9 F (36.6 C)  SpO2: 93%

## 2023-12-22 ENCOUNTER — Ambulatory Visit: Attending: Internal Medicine

## 2023-12-29 ENCOUNTER — Ambulatory Visit

## 2024-01-08 ENCOUNTER — Ambulatory Visit: Admitting: Podiatry

## 2024-01-08 DIAGNOSIS — M2031 Hallux varus (acquired), right foot: Secondary | ICD-10-CM

## 2024-01-08 DIAGNOSIS — E11621 Type 2 diabetes mellitus with foot ulcer: Secondary | ICD-10-CM | POA: Diagnosis not present

## 2024-01-08 DIAGNOSIS — L97511 Non-pressure chronic ulcer of other part of right foot limited to breakdown of skin: Secondary | ICD-10-CM | POA: Diagnosis not present

## 2024-01-08 NOTE — Progress Notes (Signed)
 Chief Complaint  Patient presents with   Toe Pain    Here today for a check of the right first toe. The tip of the toe is calloused and now turning dark. Dr. Marvis Sluder has ordered vascular studies. He states these have been canceled several times. Last A1c was 6.5 and takes Warrfrin.    HPI: 83 y.o. male presenting today with concern of a "black toe".  Patient called for this appointment today after becoming concerned about the discoloration.  He notes some confusion about an upcoming vascular test.  He thinks he needs to come here to have it performed.  Upon review of his records he is scheduled for noninvasive vascular studies at the [facility] on [address] on 01/23/2023.  He is currently wearing regular shoes that do not have a removable insole for modification today.  Past Medical History:  Diagnosis Date   Benign prostatic hyperplasia (BPH) with urinary urgency    Bile duct stone    Cervical disc disease    CHF (congestive heart failure) (HCC)    Chronic kidney disease (CKD), stage III (moderate) (HCC)    pt unaware   Coronary artery disease    pt unaware   Diabetes mellitus without complication (HCC)    Diabetes, polyneuropathy (HCC)    Diastolic dysfunction    ED (erectile dysfunction)    Edema    Elevated hemidiaphragm    Elevated liver function tests    Gait abnormality    Gallstones    GERD (gastroesophageal reflux disease)    Gout    History of anticoagulant therapy    History of rib fracture    Hyperlipidemia    Hypertension    Lung mass    Major depression    Malaise and fatigue    Mouth pain    OA (osteoarthritis)    Pneumonia    history of   Prostate disease    Pulmonary emboli (HCC)    Sleep apnea    does not use CPAP, resolved   Stool incontinence    Testosterone deficiency    Vitamin B 12 deficiency    Weight loss    Past Surgical History:  Procedure Laterality Date   BILIARY DILATION  01/22/2019   Procedure: BILIARY DILATION;   Surgeon: Ozell Blunt, MD;  Location: [facility];  Service: Endoscopy;;   BILIARY STENT PLACEMENT N/A 01/01/2019   Procedure: BILIARY STENT PLACEMENT;  Surgeon: Evangeline Hilts, MD;  Location: [facility];  Service: Endoscopy;  Laterality: N/A;   BILIARY STENT PLACEMENT N/A 01/22/2019   Procedure: BILIARY STENT PLACEMENT;  Surgeon: Ozell Blunt, MD;  Location: [facility];  Service: Endoscopy;  Laterality: N/A;   CARDIAC CATHETERIZATION  2014   ERCP N/A 01/01/2019   Procedure: ENDOSCOPIC RETROGRADE CHOLANGIOPANCREATOGRAPHY (ERCP);  Surgeon: Evangeline Hilts, MD;  Location: [facility];  Service: Endoscopy;  Laterality: N/A;   ERCP N/A 01/22/2019   Procedure: ENDOSCOPIC RETROGRADE CHOLANGIOPANCREATOGRAPHY (ERCP);  Surgeon: Ozell Blunt, MD;  Location: [facility];  Service: Endoscopy;  Laterality: N/A;  with spyglass and lithrotipsy   ERCP N/A 02/18/2019   Procedure: ENDOSCOPIC RETROGRADE CHOLANGIOPANCREATOGRAPHY (ERCP);  Surgeon: Ozell Blunt, MD;  Location: [facility];  Service: Endoscopy;  Laterality: N/A;  WITH SPYGLASS    LAPAROSCOPIC CHOLECYSTECTOMY SINGLE SITE WITH INTRAOPERATIVE CHOLANGIOGRAM N/A 12/31/2018   Procedure: LAPAROSCOPIC CHOLECYSTECTOMY SINGLE SITE WITH INTRAOPERATIVE CHOLANGIOGRAM, REPAIR OF UMBILICAL HERNIA;  Surgeon: Candyce Champagne, MD;  Location: [facility];  Service: General;  Laterality: N/A;   REMOVAL  OF STONES  01/01/2019   Procedure: REMOVAL OF STONES;  Surgeon: Evangeline Hilts, MD;  [facility]: [facility];  Service: Endoscopy;;   REMOVAL OF STONES  01/22/2019   Procedure: REMOVAL OF STONES;  Surgeon: Ozell Blunt, MD;  [facility];  Service: Endoscopy;;   REMOVAL OF STONES  02/18/2019   Procedure: REMOVAL OF STONES;  Surgeon: Ozell Blunt, MD;  [facility];  Service: Endoscopy;;   right hip replacement     SPHINCTEROTOMY  01/01/2019   Procedure: [address];  Surgeon: Evangeline Hilts, MD;  [facility];  Service: Endoscopy;;   SPHINCTEROTOMY   01/22/2019   Procedure: [address];  Surgeon: Ozell Blunt, MD;  [facility];  Service: Endoscopy;;   SPHINCTEROTOMY  02/18/2019   Procedure: [address];  Surgeon: Ozell Blunt, MD;  [facility];  Service: Endoscopy;;   SPYGLASS CHOLANGIOSCOPY N/A 01/22/2019   Procedure: [id number] CHOLANGIOSCOPY;  Surgeon: Ozell Blunt, MD;  [facility];  Service: Endoscopy;  Laterality: N/A;   SPYGLASS LITHOTRIPSY N/A 01/22/2019   Procedure: [id number] LITHOTRIPSY;  Surgeon: Ozell Blunt, MD;  [facility];  Service: Endoscopy;  Laterality: N/A;   STENT REMOVAL  01/22/2019   Procedure: STENT REMOVAL;  Surgeon: Ozell Blunt, MD;  [facility];  Service: Endoscopy;;   STENT REMOVAL  02/18/2019   Procedure: STENT REMOVAL;  Surgeon: Ozell Blunt, MD;  [facility];  Service: Endoscopy;;   Allergies  Allergen Reactions   Codeine Hives   Statins     Cramps (ALLERGY/intolerance)   Allopurinol Rash    Unknown   Doxycycline Rash    Unknown   Smoker?: former smoker   PHYSICAL EXAM: General: The patient is alert and oriented x3 in no acute distress.  Dermatology: Interspaces are clear of maceration and debris.     Wound 1:  Location: Plantar-distal right hallux        Depth: Partial-thickness to dermis        Wound Border: Hemorrhagic hyperkeratotic        Wound Base: Macerated and granular        Drainage: No active drainage noted       Odor?:  None        Surrounding Tissue: No erythema or edema to the toe        Infected?:  No        Necrosis?:  None        Pain?:  Minimal to none        Tunneling: No       Dimensions (cm): 0.6 x 0.4 x 0.1 cm  Vascular: Pedal pulses are nonpalpable.  The Doppler was utilized to listen for the pulses right foot.  They were monophasic utilizing the Doppler.  Neurological: Light touch sensation diminished right foot  Musculoskeletal Exam: There is a semirigid hallux malleus deformity of the right great  toe.  ASSESSMENT / PLAN OF CARE: 1. 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 Hallux malleus of right foot   3. 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  Hemostasis obtained.  Iodosorb ointment and DSD applied.  He will keep this on for 2 days and then go back to the daily wound instructions that Dr. Marvis Sluder had provided him at his last appointment.    Encouraged the patient to  bring in a pair of shoes that have a removable insole.  This way we can offload the ulcerated area better.  He does not want a wear surgical shoe.  He does not want new diabetic shoes at this time.  He would benefit from diabetic shoes with custom diabetic insoles to offload this lesion but he is not interested today.  Discussed risks / concerns regarding ulcer with patient and possible sequelae if left untreated.  Stressed importance of infection prevention at home. Short-term goals are: prevent infection, off-load ulcer, heal ulcer Long-term goals are:  prevent recurrence, prevent amputation.   Return for 1-2 weeks w/ Dr. Marvis Sluder for ulcer recheck R hallux.   Joe Murders, DPM, FACFAS [facility]     [address] [place], [place] [address]                Office [phone]  Fax [phone]

## 2024-01-13 ENCOUNTER — Ambulatory Visit: Admitting: Podiatry

## 2024-01-23 ENCOUNTER — Ambulatory Visit (HOSPITAL_COMMUNITY)
Admission: RE | Admit: 2024-01-23 | Discharge: 2024-01-23 | Disposition: A | Discharge status: Home / Self Care | Source: Ambulatory Visit | Attending: Podiatry | Admitting: Podiatry

## 2024-01-23 DIAGNOSIS — L97511 Non-pressure chronic ulcer of other part of right foot limited to breakdown of skin: Secondary | ICD-10-CM | POA: Diagnosis present

## 2024-01-27 ENCOUNTER — Ambulatory Visit: Admitting: Podiatry

## 2024-01-27 ENCOUNTER — Encounter: Payer: Self-pay | Admitting: Podiatry

## 2024-01-27 DIAGNOSIS — M2031 Hallux varus (acquired), right foot: Secondary | ICD-10-CM

## 2024-01-27 DIAGNOSIS — R600 Localized edema: Secondary | ICD-10-CM | POA: Diagnosis not present

## 2024-01-27 DIAGNOSIS — L97512 Non-pressure chronic ulcer of other part of right foot with fat layer exposed: Secondary | ICD-10-CM | POA: Diagnosis not present

## 2024-01-27 DIAGNOSIS — E1142 Type 2 diabetes mellitus with diabetic polyneuropathy: Secondary | ICD-10-CM | POA: Diagnosis not present

## 2024-01-27 MED ORDER — MUPIROCIN 2 % EX OINT: 1.0000 | TOPICAL_OINTMENT | Freq: Two times a day (BID) | CUTANEOUS | 2 refills | Status: DC

## 2024-01-27 NOTE — Patient Instructions (Addendum)
 More and silicone pads can be purchased from:  [email or web address]  Look for non-medicated callus pads and horseshoe pads.  These can be purchased on [employer] or [place].  I recommend that you contact Dr. Eluterio Hamburg about increasing your diuretic medication.  I have ordered a referral to cardiology. If you do not hear for them about scheduling within the next 1 week, or you have any questions please give us  a call at [phone].

## 2024-01-27 NOTE — Progress Notes (Unsigned)
 Chief Complaint  Patient presents with   Diabetic Ulcer    Wound check for the right first. It is calloused with an open center. He is having bad neuropathy pain, and a lot of swelling. He does have blister about 4-5 inches above the ankle. Swelling goes from toes to above his knees. Last A1c was two weeks ago it was 6.9. Takes Warrfirin    HPI: 83 y.o. Brandon [name] presenting today for follow-up evaluation of right hallux distal ulceration.  He denies pain today.  He is presenting with severe edema to both legs today.  He reports that he does not routinely see a cardiologist, he states that he has tried compression stockings in the past but he is unable to wear them.  He did recently have ABI testing done reported figures were suggested to be inaccurate due to extensive swelling and intolerance to the blood pressure cuffs.  Obtained values suggest triphasic waveforms of PT artery bilaterally and biphasic signals to the DP.  Past Medical History:  Diagnosis Date   Benign prostatic hyperplasia (BPH) with urinary urgency    Bile duct stone    Cervical disc disease    CHF (congestive heart failure) (HCC)    Chronic kidney disease (CKD), stage III (moderate) (HCC)    pt unaware   Coronary artery disease    pt unaware   Diabetes mellitus without complication (HCC)    Diabetes, polyneuropathy (HCC)    Diastolic dysfunction    ED (erectile dysfunction)    Edema    Elevated hemidiaphragm    Elevated liver function tests    Gait abnormality    Gallstones    GERD (gastroesophageal reflux disease)    Gout    History of anticoagulant therapy    History of rib fracture    Hyperlipidemia    Hypertension    Lung mass    Major depression    Malaise and fatigue    Mouth pain    OA (osteoarthritis)    Pneumonia    history of   Prostate disease    Pulmonary emboli (HCC)    Sleep apnea    does not use CPAP, resolved   Stool incontinence    Testosterone deficiency    Vitamin B 12 deficiency     Weight loss    Past Surgical History:  Procedure Laterality Date   BILIARY DILATION  01/22/2019   Procedure: BILIARY DILATION;  Surgeon: Ozell Blunt, MD;  Location: [facility];  Service: Endoscopy;;   BILIARY STENT PLACEMENT N/A 01/01/2019   Procedure: BILIARY STENT PLACEMENT;  Surgeon: Evangeline Hilts, MD;  [facility]: [facility];  Service: Endoscopy;  Laterality: N/A;   BILIARY STENT PLACEMENT N/A 01/22/2019   Procedure: BILIARY STENT PLACEMENT;  Surgeon: Ozell Blunt, MD;  [facility]: [facility];  Service: Endoscopy;  Laterality: N/A;   CARDIAC CATHETERIZATION  2014   ERCP N/A 01/01/2019   Procedure: ENDOSCOPIC RETROGRADE CHOLANGIOPANCREATOGRAPHY (ERCP);  Surgeon: Evangeline Hilts, MD;  Location: [facility];  Service: Endoscopy;  Laterality: N/A;   ERCP N/A 01/22/2019   Procedure: ENDOSCOPIC RETROGRADE CHOLANGIOPANCREATOGRAPHY (ERCP);  Surgeon: Ozell Blunt, MD;  Location: [facility];  Service: Endoscopy;  Laterality: N/A;  with spyglass and lithrotipsy   ERCP N/A 02/18/2019   Procedure: ENDOSCOPIC RETROGRADE CHOLANGIOPANCREATOGRAPHY (ERCP);  Surgeon: Ozell Blunt, MD;  Location: [facility];  Service: Endoscopy;  Laterality: N/A;  WITH SPYGLASS    LAPAROSCOPIC CHOLECYSTECTOMY SINGLE SITE WITH INTRAOPERATIVE CHOLANGIOGRAM N/A 12/31/2018   Procedure: LAPAROSCOPIC CHOLECYSTECTOMY SINGLE SITE WITH INTRAOPERATIVE CHOLANGIOGRAM,  REPAIR OF UMBILICAL HERNIA;  Surgeon: Candyce Champagne, MD;  Location: [facility];  Service: General;  Laterality: N/A;   REMOVAL OF STONES  01/01/2019   Procedure: REMOVAL OF STONES;  Surgeon: Evangeline Hilts, MD;  [facility];  Service: Endoscopy;;   REMOVAL OF STONES  01/22/2019   Procedure: REMOVAL OF STONES;  Surgeon: Ozell Blunt, MD;  [facility];  Service: Endoscopy;;   REMOVAL OF STONES  02/18/2019   Procedure: REMOVAL OF STONES;  Surgeon: Ozell Blunt, MD;  [facility];  Service: Endoscopy;;   right hip replacement     SPHINCTEROTOMY  01/01/2019    Procedure: [address];  Surgeon: Evangeline Hilts, MD;  [facility];  Service: Endoscopy;;   SPHINCTEROTOMY  01/22/2019   Procedure: [address];  Surgeon: Ozell Blunt, MD;  [facility];  Service: Endoscopy;;   SPHINCTEROTOMY  02/18/2019   Procedure: [address];  Surgeon: Ozell Blunt, MD;  [facility];  Service: Endoscopy;;   SPYGLASS CHOLANGIOSCOPY N/A 01/22/2019   Procedure: EAVWUJWJ CHOLANGIOSCOPY;  Surgeon: Ozell Blunt, MD;  [facility];  Service: Endoscopy;  Laterality: N/A;   SPYGLASS LITHOTRIPSY N/A 01/22/2019   Procedure: [id number] LITHOTRIPSY;  Surgeon: Ozell Blunt, MD;  [facility];  Service: Endoscopy;  Laterality: N/A;   STENT REMOVAL  01/22/2019   Procedure: STENT REMOVAL;  Surgeon: Ozell Blunt, MD;  [facility];  Service: Endoscopy;;   STENT REMOVAL  02/18/2019   Procedure: STENT REMOVAL;  Surgeon: Ozell Blunt, MD;  [facility];  Service: Endoscopy;;   Allergies  Allergen Reactions   Codeine Hives   Statins     Cramps (ALLERGY/intolerance)   Allopurinol Rash    Unknown   Doxycycline Rash    Unknown   Smoker?: former smoker   PHYSICAL EXAM: There were no vitals filed for this visit.  General: The patient is alert and oriented x3 in no acute distress.  Dermatology: Skin changes to bilateral lower extremities consistent with chronic lymphedema.  Bulla formation diffusely to lower legs, some weeping lesions. There is ulceration to right hallux distal aspect.    Wound 1:  Location: Right hallux plantar distal aspect        Depth: Subcutaneous tissue        Wound Border: Hyperkeratotic        Wound Base: Macerated with slough, increased granulation tissue with bleeding base following debridement        Drainage: No active drainage       Odor?:  No        Surrounding Tissue: No surrounding erythema or edema        Infected?:  No        Necrosis?:  No        Pain?:  No        Tunneling:  No       Dimensions (cm): Predebridement measurement 0.6 x 0.4 x 0.3 cm, postdebridement measurement 0.8 x 0.6 x 0.3 cm   Vascular: Pedal pulses are nonpalpable due to edema.  +2+3 pitting edema present with bulla formation to the legs, scant weeping serous drainage.  Pedal hair growth decreased.  No erythema  Neurological: Light touch sensation diminished bilaterally  Musculoskeletal Exam: Right hallux semireducible hallux malleus.      No data to display          ASSESSMENT / PLAN OF CARE: 1. Hallux malleus of right foot   2. 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 Diabetic polyneuropathy associated with type 2 diabetes mellitus (HCC)   4. Bilateral leg edema      Meds ordered this encounter  Medications   mupirocin  ointment (BACTROBAN ) 2 %    Sig: Apply 1 Application topically 2 (two) times daily.    Dispense:  30 g    Refill:  2   None  Medically necessary excisional debridement of right hallux ulceration performed today.  The ulceration was sharply debrided of hyperkeratotic and devitalized soft tissue with sterile #312 blade to the level of subcutaneous tissue.  Hemostasis obtained with compression.  Antibiotic ointment and DSD applied.  Reviewed off-loading with patient.  Pre and postdebridement measurements as described above.  Patient refusing surgical shoe.  Did attempt to apply offloading felt padding around the ulceration today.  Ulceration complicated by patient's semireducible hallux malleus  Low threshold to order arterial ultrasound given inadequate ABI studies, possibility that examination could be limited due to extensive swelling as well.  Reviewed daily dressing changes with patient.  Prescription for mupirocin  sent into patient's pharmacy  Discussed risks / concerns regarding ulcer with patient and possible sequelae if left untreated.  Stressed importance of infection prevention at home. Short-term goals are: prevent  infection, off-load ulcer, heal ulcer Long-term goals are:  prevent recurrence, prevent amputation.   # Bilateral leg edema in the setting of CHF - Expressed concern with patient over extent of his swelling, appears to be at risk of forming ulcerations with bulla formation presently - I recommended use of compression stockings, patient states that he cannot apply these - I advised that he speak with his PCP regarding adjusting his diuretic medication.  He is requesting referral to cardiology.  Do think that this is reasonable given the extent of his uncontrolled edema and potential inability to tolerate any compression.  Referral placed  Return in about 2 weeks (around 02/10/2024) for Wound Care.   Sahalie Beth L. Lunda Salines, AACFAS [facility]     [address] [place], [place] [address]                Office [phone]  Fax [phone]

## 2024-02-10 ENCOUNTER — Ambulatory Visit: Admitting: Podiatry

## 2024-02-10 DIAGNOSIS — M2031 Hallux varus (acquired), right foot: Secondary | ICD-10-CM

## 2024-02-10 DIAGNOSIS — L97512 Non-pressure chronic ulcer of other part of right foot with fat layer exposed: Secondary | ICD-10-CM | POA: Diagnosis not present

## 2024-02-10 DIAGNOSIS — S91209A Unspecified open wound of unspecified toe(s) with damage to nail, initial encounter: Secondary | ICD-10-CM | POA: Diagnosis not present

## 2024-02-10 MED ORDER — SILVER SULFADIAZINE 1 % EX CREA: 1.0000 | TOPICAL_CREAM | Freq: Every day | CUTANEOUS | 0 refills | Status: AC

## 2024-02-10 MED ORDER — AMOXICILLIN-POT CLAVULANATE 875-125 MG PO TABS: 1.0000 | ORAL_TABLET | Freq: Two times a day (BID) | ORAL | 0 refills | Status: DC

## 2024-02-10 NOTE — Progress Notes (Signed)
 Chief Complaint  Patient presents with   Wound Check    Right first to callous and is bleeding around the nail. He is not doing well today. He does have an apt with his PCP today. Back pain, and and SOB. A1c 6.9 in May, takes [place]    HPI: 83 y.o. male presenting today for follow-up evaluation of right hallux distal ulceration.  He reports seeing his PCP later this week and having upcoming vascular referral.  He also reports bleeding to the right hallux toenail.  He is unsure what this is from he believes he may have hit the nail.  Past Medical History:  Diagnosis Date   Benign prostatic hyperplasia (BPH) with urinary urgency    Bile duct stone    Cervical disc disease    CHF (congestive heart failure) (HCC)    Chronic kidney disease (CKD), stage III (moderate) (HCC)    pt unaware   Coronary artery disease    pt unaware   Diabetes mellitus without complication (HCC)    Diabetes, polyneuropathy (HCC)    Diastolic dysfunction    ED (erectile dysfunction)    Edema    Elevated hemidiaphragm    Elevated liver function tests    Gait abnormality    Gallstones    GERD (gastroesophageal reflux disease)    Gout    History of anticoagulant therapy    History of rib fracture    Hyperlipidemia    Hypertension    Lung mass    Major depression    Malaise and fatigue    Mouth pain    OA (osteoarthritis)    Pneumonia    history of   Prostate disease    Pulmonary emboli (HCC)    Sleep apnea    does not use CPAP, resolved   Stool incontinence    Testosterone deficiency    Vitamin B 12 deficiency    Weight loss    Past Surgical History:  Procedure Laterality Date   BILIARY DILATION  01/22/2019   Procedure: BILIARY DILATION;  Surgeon: Ozell Blunt, MD;  [facility];  Service: Endoscopy;;   BILIARY STENT PLACEMENT N/A 01/01/2019   Procedure: BILIARY STENT PLACEMENT;  Surgeon: Evangeline Hilts, MD;  [facility];  Service: Endoscopy;  Laterality: N/A;   BILIARY  STENT PLACEMENT N/A 01/22/2019   Procedure: BILIARY STENT PLACEMENT;  Surgeon: Ozell Blunt, MD;  [facility];  Service: Endoscopy;  Laterality: N/A;   CARDIAC CATHETERIZATION  2014   ERCP N/A 01/01/2019   Procedure: ENDOSCOPIC RETROGRADE CHOLANGIOPANCREATOGRAPHY (ERCP);  Surgeon: Evangeline Hilts, MD;  Location: [facility];  Service: Endoscopy;  Laterality: N/A;   ERCP N/A 01/22/2019   Procedure: ENDOSCOPIC RETROGRADE CHOLANGIOPANCREATOGRAPHY (ERCP);  Surgeon: Ozell Blunt, MD;  Location: [facility];  Service: Endoscopy;  Laterality: N/A;  with spyglass and lithrotipsy   ERCP N/A 02/18/2019   Procedure: ENDOSCOPIC RETROGRADE CHOLANGIOPANCREATOGRAPHY (ERCP);  Surgeon: Ozell Blunt, MD;  Location: [facility];  Service: Endoscopy;  Laterality: N/A;  WITH SPYGLASS    LAPAROSCOPIC CHOLECYSTECTOMY SINGLE SITE WITH INTRAOPERATIVE CHOLANGIOGRAM N/A 12/31/2018   Procedure: LAPAROSCOPIC CHOLECYSTECTOMY SINGLE SITE WITH INTRAOPERATIVE CHOLANGIOGRAM, REPAIR OF UMBILICAL HERNIA;  Surgeon: Candyce Champagne, MD;  Location: [facility];  Service: General;  Laterality: N/A;   REMOVAL OF STONES  01/01/2019   Procedure: REMOVAL OF STONES;  Surgeon: Evangeline Hilts, MD;  [facility]: [facility];  Service: Endoscopy;;   REMOVAL OF STONES  01/22/2019   Procedure: REMOVAL OF STONES;  Surgeon: Ozell Blunt, MD;  Location:  [facility];  Service: Endoscopy;;   REMOVAL OF STONES  02/18/2019   Procedure: REMOVAL OF STONES;  Surgeon: Ozell Blunt, MD;  [facility]: [facility];  Service: Endoscopy;;   right hip replacement     SPHINCTEROTOMY  01/01/2019   Procedure: SPHINCTEROTOMY;  Surgeon: Evangeline Hilts, MD;  [facility];  Service: Endoscopy;;   SPHINCTEROTOMY  01/22/2019   Procedure: [address];  Surgeon: Ozell Blunt, MD;  [facility];  Service: Endoscopy;;   SPHINCTEROTOMY  02/18/2019   Procedure: [address];  Surgeon: Ozell Blunt, MD;  [facility];  Service: Endoscopy;;   SPYGLASS  CHOLANGIOSCOPY N/A 01/22/2019   Procedure: ZOXWRUEA CHOLANGIOSCOPY;  Surgeon: Ozell Blunt, MD;  [facility]: [facility];  Service: Endoscopy;  Laterality: N/A;   SPYGLASS LITHOTRIPSY N/A 01/22/2019   Procedure: VWUJWJXB LITHOTRIPSY;  Surgeon: Ozell Blunt, MD;  [facility]: [facility];  Service: Endoscopy;  Laterality: N/A;   STENT REMOVAL  01/22/2019   Procedure: STENT REMOVAL;  Surgeon: Ozell Blunt, MD;  [facility];  Service: Endoscopy;;   STENT REMOVAL  02/18/2019   Procedure: STENT REMOVAL;  Surgeon: Ozell Blunt, MD;  [facility];  Service: Endoscopy;;   Allergies  Allergen Reactions   Codeine Hives   Statins     Cramps (ALLERGY/intolerance)   Allopurinol Rash    Unknown   Doxycycline Rash    Unknown   Smoker?: former smoker   PHYSICAL EXAM: There were no vitals filed for this visit.  General: The patient is alert and oriented x3 in no acute distress.  Dermatology: Skin changes to bilateral lower extremities consistent with chronic lymphedema.There is ulceration to right hallux distal aspect.    Wound 1:  Location: Right hallux plantar distal aspect        Depth: Subcutaneous tissue        Wound Border: Hyperkeratotic        Wound Base: Macerated with slough, increased granulation tissue with bleeding base following debridement        Drainage: No active drainage       Odor?:  No        Surrounding Tissue: No surrounding erythema or edema        Infected?:  No        Necrosis?:  No        Pain?:  No        Tunneling: No       Dimensions (cm): Predebridement measurement 0.3 x 0.3 x 0.3 cm, postdebridement measurement 0.5 x 0.6 x 0.3 cm  Right hallux nail plate is loose with bleeding noted underneath the nail plate.  Nail was removed with some bleeding to the nailbed.  No acute infection here.  Vascular: Pedal pulses are nonpalpable due to edema.  +2+3 pitting edema present with bulla formation to the legs, scant weeping serous drainage.  Pedal hair growth  decreased.  No erythema  Neurological: Light touch sensation diminished bilaterally  Musculoskeletal Exam: Right hallux semireducible hallux malleus.      No data to display          ASSESSMENT / PLAN OF CARE: 1. Traumatic avulsion of nail plate of toe, initial encounter   2. 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 Hallux malleus of right foot      Meds ordered this encounter  Medications   silver  sulfADIAZINE  (SILVADENE ) 1 % cream    Sig: Apply 1 Application topically daily.    Dispense:  50 g  Refill:  0   amoxicillin -clavulanate (AUGMENTIN ) 875-125 MG tablet    Sig: Take 1 tablet by mouth 2 (two) times daily.    Dispense:  20 tablet    Refill:  0   None  # Right hallux ulceration Medically necessary excisional debridement of right hallux ulceration performed today.  The ulceration was sharply debrided of hyperkeratotic and devitalized soft tissue with sterile #312 blade to the level of subcutaneous tissue.  Hemostasis obtained with compression.  Antibiotic ointment and DSD applied.  Reviewed off-loading with patient.  Pre and postdebridement measurements as described above.  Reviewed offloading with the patient due to concurrent hallux malleus.  Reviewed daily dressing changes with patient  # Traumatic nail avulsion -Majority of the nail plate was loose adhered to the nailbed. -Nail was still attached to the cuticle, decision was made to finish the nail avulsion due to it being loose adhered to the nailbed -Toe was anesthetized using a one-to-one ratio of 2% lidocaine  plain 0.5% Marcaine  plain, 3 cc total injected after alcohol skin prep - Was able to complete the nail avulsion with the use of nail nippers - Flush nailbed with isopropyl alcohol, no signs of purulence or underlying infection - Dressed with Silvadene  and bandage.   Silvadene  sent to patient's pharmacy to be applied to the ulceration and to the nailbed.  Return in about 1  week (around 02/17/2024) for Ulcer Check.   Gaynor Genco L. Lunda Salines, AACFAS [facility]     [address] [place], [place] [address]                Office [phone]  Fax [phone]

## 2024-02-11 ENCOUNTER — Telehealth: Payer: Self-pay | Admitting: Podiatry

## 2024-02-11 NOTE — Telephone Encounter (Signed)
 Patient called wanting to know aftercare instructions for his nail avulsion. I didn't want to tell him the wrong thing in case directions were different due to the ulcer and him being on blood thinners. Patient is concerned if he removes the bandage or walks around that it will cause bleeding. Please advise,Thanks!

## 2024-02-16 ENCOUNTER — Ambulatory Visit: Admitting: Cardiology

## 2024-02-16 NOTE — Telephone Encounter (Signed)
 Tried to call patient. Phone states can not be placed at this time. CT

## 2024-02-17 ENCOUNTER — Ambulatory Visit: Admitting: Podiatry

## 2024-02-17 ENCOUNTER — Encounter: Payer: Self-pay | Admitting: Podiatry

## 2024-02-17 DIAGNOSIS — L97512 Non-pressure chronic ulcer of other part of right foot with fat layer exposed: Secondary | ICD-10-CM

## 2024-02-17 DIAGNOSIS — S91209D Unspecified open wound of unspecified toe(s) with damage to nail, subsequent encounter: Secondary | ICD-10-CM | POA: Diagnosis not present

## 2024-02-17 NOTE — Progress Notes (Signed)
 Chief Complaint  Patient presents with   Wound Check    Right first nail removal, looks great. I have the callous soaking. (Same toe) , That toe and foot is swollen and red. Takes W[place]frin.     HPI: 83 y.o. male presenting today for follow-up evaluation of right hallux distal ulceration and traumatic ulceration site.  He reports that the sites are doing well.  Denies pain.  He has continued bilateral lower extremity edema.  Past Medical History:  Diagnosis Date   Benign prostatic hyperplasia (BPH) with urinary urgency    Bile duct stone    Cervical disc disease    CHF (congestive heart failure) (HCC)    Chronic kidney disease (CKD), stage III (moderate) (HCC)    pt unaware   Coronary artery disease    pt unaware   Diabetes mellitus without complication (HCC)    Diabetes, polyneuropathy (HCC)    Diastolic dysfunction    ED (erectile dysfunction)    Edema    Elevated hemidiaphragm    Elevated liver function tests    Gait abnormality    Gallstones    GERD (gastroesophageal reflux disease)    Gout    History of anticoagulant therapy    History of rib fracture    Hyperlipidemia    Hypertension    Lung mass    Major depression    Malaise and fatigue    Mouth pain    OA (osteoarthritis)    Pneumonia    history of   Prostate disease    Pulmonary emboli (HCC)    Sleep apnea    does not use CPAP, resolved   Stool incontinence    Testosterone deficiency    Vitamin B 12 deficiency    Weight loss    Past Surgical History:  Procedure Laterality Date   BILIARY DILATION  01/22/2019   Procedure: BILIARY DILATION;  Surgeon: Ozell Blunt, MD;  [facility]: [facility];  Service: Endoscopy;;   BILIARY STENT PLACEMENT N/A 01/01/2019   Procedure: BILIARY STENT PLACEMENT;  Surgeon: Evangeline Hilts, MD;  [facility]: [facility];  Service: Endoscopy;  Laterality: N/A;   BILIARY STENT PLACEMENT N/A 01/22/2019   Procedure: BILIARY STENT PLACEMENT;  Surgeon: Ozell Blunt, MD;  [facility];  Service: Endoscopy;  Laterality: N/A;   CARDIAC CATHETERIZATION  2014   ERCP N/A 01/01/2019   Procedure: ENDOSCOPIC RETROGRADE CHOLANGIOPANCREATOGRAPHY (ERCP);  Surgeon: Evangeline Hilts, MD;  Location: [facility];  Service: Endoscopy;  Laterality: N/A;   ERCP N/A 01/22/2019   Procedure: ENDOSCOPIC RETROGRADE CHOLANGIOPANCREATOGRAPHY (ERCP);  Surgeon: Ozell Blunt, MD;  Location: [facility];  Service: Endoscopy;  Laterality: N/A;  with spyglass and lithrotipsy   ERCP N/A 02/18/2019   Procedure: ENDOSCOPIC RETROGRADE CHOLANGIOPANCREATOGRAPHY (ERCP);  Surgeon: Ozell Blunt, MD;  Location: [facility];  Service: Endoscopy;  Laterality: N/A;  WITH SPYGLASS    LAPAROSCOPIC CHOLECYSTECTOMY SINGLE SITE WITH INTRAOPERATIVE CHOLANGIOGRAM N/A 12/31/2018   Procedure: LAPAROSCOPIC CHOLECYSTECTOMY SINGLE SITE WITH INTRAOPERATIVE CHOLANGIOGRAM, REPAIR OF UMBILICAL HERNIA;  Surgeon: Candyce Champagne, MD;  Location: [facility];  Service: General;  Laterality: N/A;   REMOVAL OF STONES  01/01/2019   Procedure: REMOVAL OF STONES;  Surgeon: Evangeline Hilts, MD;  [facility];  Service: Endoscopy;;   REMOVAL OF STONES  01/22/2019   Procedure: REMOVAL OF STONES;  Surgeon: Ozell Blunt, MD;  [facility];  Service: Endoscopy;;   REMOVAL OF STONES  02/18/2019   Procedure: REMOVAL OF STONES;  Surgeon: Ozell Blunt, MD;  [facility];  Service: Endoscopy;;   right hip replacement     SPHINCTEROTOMY  01/01/2019   Procedure: SPHINCTEROTOMY;  Surgeon: Evangeline Hilts, MD;  [facility];  Service: Endoscopy;;   SPHINCTEROTOMY  01/22/2019   Procedure: [address];  Surgeon: Ozell Blunt, MD;  [facility];  Service: Endoscopy;;   SPHINCTEROTOMY  02/18/2019   Procedure: [address];  Surgeon: Ozell Blunt, MD;  [facility];  Service: Endoscopy;;   SPYGLASS CHOLANGIOSCOPY N/A 01/22/2019   Procedure: [id number] CHOLANGIOSCOPY;  Surgeon: Ozell Blunt, MD;  [facility];  Service: Endoscopy;  Laterality: N/A;   SPYGLASS LITHOTRIPSY N/A 01/22/2019   Procedure: NWGNFAOZ LITHOTRIPSY;  Surgeon: Ozell Blunt, MD;  [facility]: [facility];  Service: Endoscopy;  Laterality: N/A;   STENT REMOVAL  01/22/2019   Procedure: STENT REMOVAL;  Surgeon: Ozell Blunt, MD;  [facility];  Service: Endoscopy;;   STENT REMOVAL  02/18/2019   Procedure: STENT REMOVAL;  Surgeon: Ozell Blunt, MD;  [facility];  Service: Endoscopy;;   Allergies  Allergen Reactions   Codeine Hives   Statins Nausea Only    Cramps (ALLERGY/intolerance)   Allopurinol Rash    Unknown   Doxycycline Rash    Unknown   Smoker?: former smoker   PHYSICAL EXAM: There were no vitals filed for this visit.  General: The patient is alert and oriented x3 in no acute distress.  Dermatology: Skin changes to bilateral lower extremities consistent with chronic lymphedema.There is ulceration to right hallux distal aspect is callused over today.  Following debridement there is partial-thickness skin breakdown with good wound healing progression overall measuring 0.3 cm in diameter.  Right hallux nail avulsion site is stable and appears to be healing well.  Vascular: Pedal pulses are nonpalpable due to edema.  +2+3 pitting edema present with bulla formation to the legs, scant weeping serous drainage.  Pedal hair growth decreased.  Dependent erythema bilaterally.  Neurological: Light touch sensation diminished bilaterally  Musculoskeletal Exam: Right hallux semireducible hallux malleus.      No data to display          ASSESSMENT / PLAN OF CARE: 1. Traumatic avulsion of nail plate of toe, subsequent encounter   2. 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  None  # Right hallux ulceration Medically necessary excisional debridement of right hallux ulceration performed today.  The  ulceration was sharply debrided of hyperkeratotic and devitalized soft tissue with sterile #312 blade to the level of dermal tissue.  Hemostasis obtained with compression.  Antibiotic ointment and DSD applied.  Reviewed off-loading with patient.  Pre and postdebridement measurements as described above.  Reviewed offloading with the patient due to concurrent hallux malleus.  Reviewed daily dressing changes with patient  # Traumatic nail avulsion -Well healed at this point   Return in about 2 weeks (around 03/02/2024) for Diabetic Foot Care.   Jamyria Ozanich L. Lunda Salines, AACFAS [facility]     [address] [place], [place] [address]                Office [phone]  Fax [phone]

## 2024-02-18 ENCOUNTER — Encounter: Payer: Self-pay | Admitting: Cardiology

## 2024-02-18 ENCOUNTER — Ambulatory Visit: Attending: Cardiology | Admitting: Cardiology

## 2024-02-18 VITALS — BP 120/74 | HR 89 | Ht 75.0 in | Wt 221.4 lb

## 2024-02-18 DIAGNOSIS — I5032 Chronic diastolic (congestive) heart failure: Secondary | ICD-10-CM | POA: Diagnosis not present

## 2024-02-18 DIAGNOSIS — I2699 Other pulmonary embolism without acute cor pulmonale: Secondary | ICD-10-CM | POA: Diagnosis not present

## 2024-02-18 DIAGNOSIS — E782 Mixed hyperlipidemia: Secondary | ICD-10-CM | POA: Diagnosis not present

## 2024-02-18 DIAGNOSIS — Z7901 Long term (current) use of anticoagulants: Secondary | ICD-10-CM

## 2024-02-18 DIAGNOSIS — I10 Essential (primary) hypertension: Secondary | ICD-10-CM

## 2024-02-18 DIAGNOSIS — R0609 Other forms of dyspnea: Secondary | ICD-10-CM

## 2024-02-18 DIAGNOSIS — J4489 Other specified chronic obstructive pulmonary disease: Secondary | ICD-10-CM

## 2024-02-18 NOTE — Progress Notes (Signed)
 Cardiology Consultation:    Date:  02/18/2024   ID:  Wing Hauser., DOB 1941/03/01, MRN [id number]  PCP:  Abbe Hoard., MD  Cardiologist:  Ralene Burger, MD   Referring MD: Reina Cara, [place]   Chief Complaint  Patient presents with   Leg Swelling    History of Present Illness:    Lyden Redner. is a 83 y.o. male who is being seen today for the evaluation of swollen legs at the request of Reina Cara, DPM.  He started on difficulty good with started from him.  Should look like he be referred to me because of swelling of lower extremities.  He said it has been going on for at least a year or so and lately getting better.  Past medical history significant for essential hypertension, chronic kidney disease, diastolic congestive heart failure, history of PE after that on Coumadin , dyslipidemia.  He said his legs are swollen all the time and are not better in the morning.  It is associated with quite significant pain he has over there.  He is diabetic however when asked him about it he said that he is not sure if he is.  Apparently does have some neuropathy which is contributing to his symptomatology in the lower extremities.  Recently he did have some toenail removal with he complained about having pain there as well he does have some dressing that difficulty removing.  Does not smoke anymore, he is able to do some work but very limited.  No paroxysmal nocturnal dyspnea.  Denies have any chest pain tightness squeezing pressure burning chest General He said that he is not feeling well.  He tells me that he never have any heart trouble.  He does have family history of coronary disease but no premature  Past Medical History:  Diagnosis Date   Benign prostatic hyperplasia (BPH) with urinary urgency    Bile duct stone    Cervical disc disease    CHF (congestive heart failure) (HCC)    Chronic kidney disease (CKD), stage III (moderate) (HCC)    pt unaware   Coronary artery  disease    pt unaware   Diabetes mellitus without complication (HCC)    Diabetes, polyneuropathy (HCC)    Diastolic dysfunction    ED (erectile dysfunction)    Edema    Elevated hemidiaphragm    Elevated liver function tests    Gait abnormality    Gallstones    GERD (gastroesophageal reflux disease)    Gout    History of anticoagulant therapy    History of rib fracture    Hyperlipidemia    Hypertension    Lung mass    Major depression    Malaise and fatigue    Mouth pain    OA (osteoarthritis)    Pneumonia    history of   Prostate disease    Pulmonary emboli (HCC)    Sleep apnea    does not use CPAP, resolved   Stool incontinence    Testosterone deficiency    Vitamin B 12 deficiency    Weight loss     Past Surgical History:  Procedure Laterality Date   BILIARY DILATION  01/22/2019   Procedure: BILIARY DILATION;  Surgeon: Ozell Blunt, MD;  Location: [facility];  Service: Endoscopy;;   BILIARY STENT PLACEMENT N/A 01/01/2019   Procedure: BILIARY STENT PLACEMENT;  Surgeon: Evangeline Hilts, MD;  Location: [facility];  Service: Endoscopy;  Laterality: N/A;  BILIARY STENT PLACEMENT N/A 01/22/2019   Procedure: BILIARY STENT PLACEMENT;  Surgeon: Ozell Blunt, MD;  Location: [facility];  Service: Endoscopy;  Laterality: N/A;   CARDIAC CATHETERIZATION  2014   ERCP N/A 01/01/2019   Procedure: ENDOSCOPIC RETROGRADE CHOLANGIOPANCREATOGRAPHY (ERCP);  Surgeon: Evangeline Hilts, MD;  Location: [facility];  Service: Endoscopy;  Laterality: N/A;   ERCP N/A 01/22/2019   Procedure: ENDOSCOPIC RETROGRADE CHOLANGIOPANCREATOGRAPHY (ERCP);  Surgeon: Ozell Blunt, MD;  Location: [facility];  Service: Endoscopy;  Laterality: N/A;  with spyglass and lithrotipsy   ERCP N/A 02/18/2019   Procedure: ENDOSCOPIC RETROGRADE CHOLANGIOPANCREATOGRAPHY (ERCP);  Surgeon: Ozell Blunt, MD;  Location: [facility];  Service: Endoscopy;  Laterality: N/A;  WITH SPYGLASS    LAPAROSCOPIC CHOLECYSTECTOMY SINGLE  SITE WITH INTRAOPERATIVE CHOLANGIOGRAM N/A 12/31/2018   Procedure: LAPAROSCOPIC CHOLECYSTECTOMY SINGLE SITE WITH INTRAOPERATIVE CHOLANGIOGRAM, REPAIR OF UMBILICAL HERNIA;  Surgeon: Candyce Champagne, MD;  Location: [facility];  Service: General;  Laterality: N/A;   REMOVAL OF STONES  01/01/2019   Procedure: REMOVAL OF STONES;  Surgeon: Evangeline Hilts, MD;  Location: [facility];  Service: Endoscopy;;   REMOVAL OF STONES  01/22/2019   Procedure: REMOVAL OF STONES;  Surgeon: Ozell Blunt, MD;  Location: [facility];  Service: Endoscopy;;   REMOVAL OF STONES  02/18/2019   Procedure: REMOVAL OF STONES;  Surgeon: Ozell Blunt, MD;  [facility]: [facility];  Service: Endoscopy;;   right hip replacement     SPHINCTEROTOMY  01/01/2019   Procedure: [address];  Surgeon: Evangeline Hilts, MD;  [facility]: [facility];  Service: Endoscopy;;   SPHINCTEROTOMY  01/22/2019   Procedure: [address];  Surgeon: Ozell Blunt, MD;  [facility];  Service: Endoscopy;;   SPHINCTEROTOMY  02/18/2019   Procedure: [address];  Surgeon: Ozell Blunt, MD;  [facility];  Service: Endoscopy;;   SPYGLASS CHOLANGIOSCOPY N/A 01/22/2019   Procedure: ZOXWRUEA CHOLANGIOSCOPY;  Surgeon: Ozell Blunt, MD;  Location: [facility];  Service: Endoscopy;  Laterality: N/A;   SPYGLASS LITHOTRIPSY N/A 01/22/2019   Procedure: [id number] LITHOTRIPSY;  Surgeon: Ozell Blunt, MD;  [facility];  Service: Endoscopy;  Laterality: N/A;   STENT REMOVAL  01/22/2019   Procedure: STENT REMOVAL;  Surgeon: Ozell Blunt, MD;  [facility]: [facility];  Service: Endoscopy;;   STENT REMOVAL  02/18/2019   Procedure: STENT REMOVAL;  Surgeon: Ozell Blunt, MD;  Location: [facility];  Service: Endoscopy;;    Current Medications: Current Meds  Medication Sig   acetaminophen  (TYLENOL ) 325 MG tablet Give 3 tablets   amoxicillin  (AMOXIL ) 500 MG capsule Take 1 capsule (500 mg total) by mouth 3 (three) times daily.   amoxicillin -clavulanate  (AUGMENTIN ) 875-125 MG tablet Take 1 tablet by mouth 2 (two) times daily.   clotrimazole-betamethasone (LOTRISONE) cream Apply sparingly to rash twice daily as needed.   DULoxetine  (CYMBALTA ) 30 MG capsule Take 30 mg by mouth every evening.   ergocalciferol (VITAMIN D2) 1.25 MG (50000 UT) capsule Take 50,000 Units by mouth every Monday.   fenofibrate  160 MG tablet Take 160 mg by mouth at bedtime.   finasteride  (PROSCAR ) 5 MG tablet Take 1 tablet by mouth daily.   fosinopril (MONOPRIL) 10 MG tablet Take 10 mg by mouth daily.   furosemide  (LASIX ) 40 MG tablet Take by mouth.   lidocaine  (LIDODERM ) 5 % Place 1 patch onto the skin every 12 (twelve) hours as needed (pain). Remove & Discard patch within 12 hours or as directed by MD   mupirocin  ointment (BACTROBAN ) 2 % Apply 1 Application topically 2 (two) times  daily.   omeprazole (PRILOSEC) 10 MG capsule Take 10 mg by mouth daily.   oxybutynin  (DITROPAN ) 5 MG tablet Take 1 tablet (5 mg total) by mouth 2 (two) times daily.   pantoprazole  (PROTONIX ) 20 MG tablet Take 20 mg by mouth daily.   polyethylene glycol (MIRALAX  / GLYCOLAX ) 17 g packet Take 17 g by mouth daily.   Probiotic Product (ALIGN PO) Take 1 capsule by mouth daily.   senna-docusate (SENOKOT-S) 8.6-50 MG tablet Take 2 tablets by mouth 2 (two) times daily.   silver  sulfADIAZINE  (SILVADENE ) 1 % cream Apply 1 Application topically daily.   tamsulosin  (FLOMAX ) 0.4 MG CAPS capsule Take 0.4 mg by mouth daily.   vitamin B-12 1000 MCG tablet Take 1 tablet (1,000 mcg total) by mouth daily.   warfarin (COUMADIN ) 5 MG tablet Take 1 tablet (5 mg total) by mouth daily. (Patient taking differently: Take 5 mg by mouth every evening.)     Allergies:   Codeine, Statins, Allopurinol, and Doxycycline   Social History   Socioeconomic History   Marital status: Married    Spouse name: Wallene Gum   Number of children: 1   Years of education: 12   Highest education level: 12th grade  Occupational History    Not on file  Tobacco Use   Smoking status: Former    Current packs/day: 0.00    Average packs/day: 0.3 packs/day for 10.0 years (2.5 ttl pk-yrs)    Types: Cigarettes    Start date: 50    Quit date: 73    Years since quitting: 38.4   Smokeless tobacco: Never   Tobacco comments:    declined  Vaping Use   Vaping status: Never Used  Substance and Sexual Activity   Alcohol use: Never   Drug use: Never   Sexual activity: Not Currently  Other Topics Concern   Not on file  Social History Narrative   HOH, embarrassed to use a cane.  Does not go out much.    Social Drivers of Corporate investment banker Strain: Low Risk  (12/28/2018)   Overall Financial Resource Strain (CARDIA)    Difficulty of Paying Living Expenses: Not very hard  Food Insecurity: Low Risk  (12/17/2023)   Received from [facility]   Hunger Vital Sign    Within the past 12 months, you worried that your food would run out before you got money to buy more: Never true    Within the past 12 months, the food you bought just didn't last and you didn't have money to get more. : Never true  Transportation Needs: No Transportation Needs (12/17/2023)   Received from [facility]    In the past 12 months, has lack of reliable transportation kept you from medical appointments, meetings, work or from getting things needed for daily living? : No  Physical Activity: Inactive (12/28/2018)   Exercise Vital Sign    Days of Exercise per Week: 0 days    Minutes of Exercise per Session: 0 min  Stress: No Stress Concern Present (12/28/2018)   [employer] of Occupational Health - Occupational Stress Questionnaire    Feeling of Stress : Only a little  Social Connections: Unknown (01/07/2022)   Received from [facility]   Social Network    Social Network: Not on file     Family History: The patient's family history includes Heart disease in his father. ROS:   Please see the history of present illness.     All  14 point review of systems negative except as described per history of present illness.  EKGs/Labs/Other Studies Reviewed:    The following studies were reviewed today:   EKG:  EKG Interpretation Date/Time:  Wednesday February 18 2024 12:00:46 EDT Ventricular Rate:  89 PR Interval:  186 QRS Duration:  134 QT Interval:  390 QTC Calculation: 474 R Axis:   4  Text Interpretation: Sinus rhythm with Premature atrial complexes Right bundle branch block Abnormal ECG When compared with ECG of 21-Sep-2021 09:25, PREVIOUS ECG IS PRESENT Confirmed by Ralene Burger [phone]) on 02/18/2024 12:04:15 PM    Recent Labs: No results found for requested labs within last 365 days.  Recent Lipid Panel No results found for: CHOL, TRIG, HDL, CHOLHDL, VLDL, LDLCALC, LDLDIRECT  Physical Exam:    VS:  BP 120/74 (BP Location: Right Arm, Patient Position: Sitting)   Pulse 89   Ht 6' 3 (1.905 m)   Wt 221 lb 6.4 oz (100.4 kg)   SpO2 91%   BMI 27.67 kg/m     Wt Readings from Last 3 Encounters:  02/18/24 221 lb 6.4 oz (100.4 kg)  02/18/19 223 lb (101.2 kg)  01/22/19 220 lb (99.8 kg)     GEN:  Well nourished, well developed in no acute distress HEENT: Normal NECK: No JVD; No carotid bruits LYMPHATICS: No lymphadenopathy CARDIAC: RRR, no murmurs, no rubs, no gallops RESPIRATORY:  Clear to auscultation without rales, wheezing or rhonchi  ABDOMEN: Soft, non-tender, non-distended MUSCULOSKELETAL:  No edema; No deformity  SKIN: Warm and dry NEUROLOGIC:  Alert and oriented x 3 PSYCHIATRIC:  Normal affect   ASSESSMENT:    1. Essential hypertension   2. Chronic diastolic CHF (congestive heart failure) (HCC)   3. Mixed hyperlipidemia   4. Pulmonary embolism on long-term anticoagulation therapy (HCC)   5. COPD with chronic bronchitis (HCC)    PLAN:    In order of problems listed above:  Swelling of lower extremities.  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  Will schedule him to have echocardiogram to assess left ventricle ejection fraction.  Will also schedule him to have proBNP as well as Chem-7 when he comes for echocardiogram. Mixed dyslipidemia I did review K PN which show me his LDL 71 HDL of 52 that is a decent control, he is on fenofibrate  however I do not see statin in the future we will have a discussion about potentially initiating statin. Diabetes mellitus.  High asking repeatedly if you have that problem he told me now about the summary of illnesses 6.9 so clearly he is diabetic that being followed by internal medicine team.  Ideally he need to be on aspirin because of high risk but because of Coumadin  we will not start that medication. 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  He is getting some diuretic with limited success will check proBNP Chem-7 to decide how aggressive we want to be   Medication Adjustments/Labs and Tests Ordered: Current medicines are reviewed at length with the patient today.  Concerns regarding medicines are outlined above.  Orders Placed This Encounter  Procedures   EKG 12-Lead   No orders of the defined types were placed in this encounter.   Signed, Manfred Seed, MD, [facility]. 02/18/2024 12:20 PM    [facility]

## 2024-02-18 NOTE — Patient Instructions (Signed)
 Medication Instructions:  Your physician recommends that you continue on your current medications as directed. Please refer to the Current Medication list given to you today.  *If you need a refill on your cardiac medications before your next appointment, please call your pharmacy*   Lab Work: Your physician recommends that you return for lab work in: when comes for Echo You need to have labs done when you are fasting.  You can come Monday through Friday 8:30 am to 12:00 pm and 1:15 to 4:30. You do not need to make an appointment as the order has already been placed. The labs you are going to have done are BMET, ProBNP.    Testing/Procedures: Your physician has requested that you have an echocardiogram. Echocardiography is a painless test that uses sound waves to create images of your heart. It provides your doctor with information about the size and shape of your heart and how well your heart's chambers and valves are working. This procedure takes approximately one hour. There are no restrictions for this procedure. Please do NOT wear cologne, perfume, aftershave, or lotions (deodorant is allowed). Please arrive 15 minutes prior to your appointment time.  Please note: We ask at that you not bring children with you during ultrasound (echo/ vascular) testing. Due to room size and safety concerns, children are not allowed in the ultrasound rooms during exams. Our front office staff cannot provide observation of children in our lobby area while testing is being conducted. An adult accompanying a patient to their appointment will only be allowed in the ultrasound room at the discretion of the ultrasound technician under special circumstances. We apologize for any inconvenience.    Follow-Up: At [facility], you and your health needs are our priority.  As part of our continuing mission to provide you with exceptional heart care, we have created designated Provider Care Teams.  These Care Teams  include your primary Cardiologist (physician) and Advanced Practice Providers (APPs -  Physician Assistants and Nurse Practitioners) who all work together to provide you with the care you need, when you need it.  We recommend signing up for the patient portal called MyChart.  Sign up information is provided on this After Visit Summary.  MyChart is used to connect with patients for Virtual Visits (Telemedicine).  Patients are able to view lab/test results, encounter notes, upcoming appointments, etc.  Non-urgent messages can be sent to your provider as well.   To learn more about what you can do with MyChart, go to [email or web address].    Your next appointment:   1 month(s)  The format for your next appointment:   In Person  Provider:   Ralene Burger, MD    Other Instructions NA

## 2024-03-02 ENCOUNTER — Encounter: Payer: Self-pay | Admitting: Podiatry

## 2024-03-02 ENCOUNTER — Ambulatory Visit: Admitting: Podiatry

## 2024-03-02 DIAGNOSIS — M79674 Pain in right toe(s): Secondary | ICD-10-CM

## 2024-03-02 DIAGNOSIS — E1142 Type 2 diabetes mellitus with diabetic polyneuropathy: Secondary | ICD-10-CM

## 2024-03-02 DIAGNOSIS — M79675 Pain in left toe(s): Secondary | ICD-10-CM | POA: Diagnosis not present

## 2024-03-02 DIAGNOSIS — B351 Tinea unguium: Secondary | ICD-10-CM | POA: Diagnosis not present

## 2024-03-02 DIAGNOSIS — L84 Corns and callosities: Secondary | ICD-10-CM

## 2024-03-02 NOTE — Progress Notes (Signed)
 Subjective:  Patient ID: Brandon [name]., male    DOB: [birth year],  MRN: [id number]  Chief Complaint  Patient presents with   [name]    [facility] Brandon [name] with out callous. Feet are swollen today, right is worse. He states his feet feel like he is walking on rocks. A1c 6.2 in May, warrfrin.     83 y.o. male presents with the above complaint. History confirmed with patient. Patient presenting with pain related to dystrophic thickened elongated nails. Patient is unable to trim own nails related to nail dystrophy and mobility issues. Patient does  have a history of T2DM.  Last A1c 6.2.  He is on chronic anticoagulation, takes warfarin.  Patient does have a callus at the distal tip of the right first toe at site of prior ulceration.  He has ongoing lower extremity swelling and has been into see cardiologist who is investigating driving factor of the swelling further.  Does have history of CHF and DVT which could certainly be the underlying cause.  Does endorse pain to bilateral feet today, does endorse low back pain.  Objective:  Physical Exam: warm, good capillary refill, pedal skin atrophic, diminished pedal hair growth nail exam onychomycosis of the toenails, onycholysis, dystrophic nails, and site of right first toe prior nail avulsion appears stable and well-healed. protective sensation absent, feet appear warm well-perfused.  Difficult to palpate DP and PT pulses due to extent of swelling. +2 pitting edema present bilateral with chronic venous stasis changes to both lower extremities. Left Foot:  Pain with palpation of nails due to elongation and dystrophic growth.  Diffuse pain plantar foot and to plantar heel Right Foot: Pain with palpation of nails due to elongation and dystrophic growth.  Diffuse pain plantar foot and to plantar heel  Assessment:   1. Diabetic polyneuropathy associated with type 2 diabetes mellitus (HCC)   2. Pain due to onychomycosis of toenails of both feet   3. Callus of foot       Plan:  Patient was evaluated and treated and all questions answered.  #Hyperkeratotic lesions/pre ulcerative calluses present right first toe distal aspect All symptomatic hyperkeratoses x 1 separate lesions were safely debrided as a courtesy with a sterile #312 blade to patient's level of comfort without incident. We discussed preventative and palliative care of these lesions including supportive and accommodative shoegear, padding, prefabricated and custom molded accommodative orthoses, use of a pumice stone and lotions/creams daily.  #Onychomycosis with pain  -Nails palliatively debrided as below. -Educated on self-care  Procedure: Nail Debridement Rationale: Pain Type of Debridement: manual, sharp debridement. Instrumentation: Nail nipper, rotary burr. Number of Nails: 10  # Diabetes with neuropathy # Generalized foot pain Patient educated on diabetes. Discussed proper diabetic foot care and discussed risks and complications of disease. Educated patient in depth on reasons to return to the office immediately should he/she discover anything concerning or new on the feet. All questions answered. Discussed proper shoes as well.  - Do suspect swelling, neuropathy, low back pain as possible causes of the foot pain as well. - Discussed use of a good supportive shoe and over-the-counter arch supports.  He will consider this going forward.  He does report sciatica-like symptoms as well. - May consider diabetic shoes at follow-up as well - If foot pain is persistent, consider bilateral radiographs  Return in about 3 months (around 06/02/2024) for Diabetic Foot Care.  Radiographs of foot pain is persistent, may return as needed for this.  Ethan Saddler, DPM [facility]

## 2024-03-08 ENCOUNTER — Ambulatory Visit: Attending: Cardiology

## 2024-03-08 DIAGNOSIS — R0609 Other forms of dyspnea: Secondary | ICD-10-CM | POA: Diagnosis not present

## 2024-03-08 LAB — ECHOCARDIOGRAM COMPLETE
AR max vel: 1.7 cm2
AV Area VTI: 1.99 cm2
AV Area mean vel: 1.59 cm2
AV Mean grad: 12.8 mmHg
AV Peak grad: 24.7 mmHg
Ao pk vel: 2.48 m/s
Area-P 1/2: 2.16 cm2
MV VTI: 2.06 cm2
S' Lateral: 3.1 cm

## 2024-03-10 ENCOUNTER — Telehealth: Payer: Self-pay | Admitting: Urology

## 2024-03-10 NOTE — Telephone Encounter (Signed)
 Pt called saying he is having a time with his feet and really needs to speak with Dr. Lamount.   Call back # [phone]

## 2024-03-12 ENCOUNTER — Telehealth: Payer: Self-pay | Admitting: Podiatry

## 2024-03-15 ENCOUNTER — Ambulatory Visit: Admitting: Podiatry

## 2024-03-15 ENCOUNTER — Encounter: Payer: Self-pay | Admitting: Podiatry

## 2024-03-15 ENCOUNTER — Ambulatory Visit (INDEPENDENT_AMBULATORY_CARE_PROVIDER_SITE_OTHER)

## 2024-03-15 DIAGNOSIS — M7752 Other enthesopathy of left foot: Secondary | ICD-10-CM

## 2024-03-15 DIAGNOSIS — M722 Plantar fascial fibromatosis: Secondary | ICD-10-CM | POA: Diagnosis not present

## 2024-03-15 DIAGNOSIS — M7751 Other enthesopathy of right foot: Secondary | ICD-10-CM | POA: Diagnosis not present

## 2024-03-15 DIAGNOSIS — M76821 Posterior tibial tendinitis, right leg: Secondary | ICD-10-CM

## 2024-03-15 DIAGNOSIS — R600 Localized edema: Secondary | ICD-10-CM

## 2024-03-15 DIAGNOSIS — M76822 Posterior tibial tendinitis, left leg: Secondary | ICD-10-CM

## 2024-03-15 MED ORDER — TRIAMCINOLONE ACETONIDE 10 MG/ML IJ SUSP: 10.0000 mg | Freq: Once | INTRAMUSCULAR | Status: AC

## 2024-03-15 NOTE — Patient Instructions (Signed)

## 2024-03-15 NOTE — Progress Notes (Unsigned)
 Chief Complaint  Patient presents with   Foot Pain    Bilateral arch pain. Pain is primarily in the bottom arch area. He does have an actively bleeding wound, very tiny on the right 1st.     HPI: 83 y.o. male presenting today with c/o pain in the bilateral medial arches and bottom of the bilateral heel.  He reports that this has been very painful and has limits his ability to weight-bear.  He does also have a spot that is bleeding on top of the right first toe that he was unaware of.  He is wearing tight fitting shoes today.  Past Medical History:  Diagnosis Date   Benign prostatic hyperplasia (BPH) with urinary urgency    Bile duct stone    Cervical disc disease    CHF (congestive heart failure) (HCC)    Chronic kidney disease (CKD), stage III (moderate) (HCC)    pt unaware   Coronary artery disease    pt unaware   Diabetes mellitus without complication (HCC)    Diabetes, polyneuropathy (HCC)    Diastolic dysfunction    ED (erectile dysfunction)    Edema    Elevated hemidiaphragm    Elevated liver function tests    Gait abnormality    Gallstones    GERD (gastroesophageal reflux disease)    Gout    History of anticoagulant therapy    History of rib fracture    Hyperlipidemia    Hypertension    Lung mass    Major depression    Malaise and fatigue    Mouth pain    OA (osteoarthritis)    Pneumonia    history of   Prostate disease    Pulmonary emboli (HCC)    Sleep apnea    does not use CPAP, resolved   Stool incontinence    Testosterone deficiency    Vitamin B 12 deficiency    Weight loss    Past Surgical History:  Procedure Laterality Date   BILIARY DILATION  01/22/2019   Procedure: BILIARY DILATION;  Surgeon: Rosalie Kitchens, MD;  Location: [facility];  Service: Endoscopy;;   BILIARY STENT PLACEMENT N/A 01/01/2019   Procedure: BILIARY STENT PLACEMENT;  Surgeon: Burnette Fallow, MD;  [facility];  Service: Endoscopy;  Laterality: N/A;   BILIARY STENT  PLACEMENT N/A 01/22/2019   Procedure: BILIARY STENT PLACEMENT;  Surgeon: Rosalie Kitchens, MD;  [facility];  Service: Endoscopy;  Laterality: N/A;   CARDIAC CATHETERIZATION  2014   ERCP N/A 01/01/2019   Procedure: ENDOSCOPIC RETROGRADE CHOLANGIOPANCREATOGRAPHY (ERCP);  Surgeon: Burnette Fallow, MD;  Location: [facility];  Service: Endoscopy;  Laterality: N/A;   ERCP N/A 01/22/2019   Procedure: ENDOSCOPIC RETROGRADE CHOLANGIOPANCREATOGRAPHY (ERCP);  Surgeon: Rosalie Kitchens, MD;  Location: [facility];  Service: Endoscopy;  Laterality: N/A;  with spyglass and lithrotipsy   ERCP N/A 02/18/2019   Procedure: ENDOSCOPIC RETROGRADE CHOLANGIOPANCREATOGRAPHY (ERCP);  Surgeon: Rosalie Kitchens, MD;  Location: [facility];  Service: Endoscopy;  Laterality: N/A;  WITH SPYGLASS    LAPAROSCOPIC CHOLECYSTECTOMY SINGLE SITE WITH INTRAOPERATIVE CHOLANGIOGRAM N/A 12/31/2018   Procedure: LAPAROSCOPIC CHOLECYSTECTOMY SINGLE SITE WITH INTRAOPERATIVE CHOLANGIOGRAM, REPAIR OF UMBILICAL HERNIA;  Surgeon: Sheldon Standing, MD;  [facility];  Service: General;  Laterality: N/A;   REMOVAL OF STONES  01/01/2019   Procedure: REMOVAL OF STONES;  Surgeon: Burnette Fallow, MD;  [facility];  Service: Endoscopy;;   REMOVAL OF STONES  01/22/2019   Procedure: REMOVAL OF STONES;  Surgeon: Rosalie Kitchens, MD;  [facility];  Service: Endoscopy;;   REMOVAL OF STONES  02/18/2019   Procedure: REMOVAL OF STONES;  Surgeon: Rosalie Kitchens, MD;  [facility];  Service: Endoscopy;;   right hip replacement     SPHINCTEROTOMY  01/01/2019   Procedure: SPHINCTEROTOMY;  Surgeon: Burnette Fallow, MD;  [facility];  Service: Endoscopy;;   SPHINCTEROTOMY  01/22/2019   Procedure: ANNETT;  Surgeon: Rosalie Kitchens, MD;  [facility];  Service: Endoscopy;;   SPHINCTEROTOMY  02/18/2019   Procedure: ANNETT;  Surgeon: Rosalie Kitchens, MD;  [facility];  Service: Endoscopy;;   SPYGLASS  CHOLANGIOSCOPY N/A 01/22/2019   Procedure: DEBHOJDD CHOLANGIOSCOPY;  Surgeon: Rosalie Kitchens, MD;  [facility];  Service: Endoscopy;  Laterality: N/A;   SPYGLASS LITHOTRIPSY N/A 01/22/2019   Procedure: DEBHOJDD LITHOTRIPSY;  Surgeon: Rosalie Kitchens, MD;  [facility];  Service: Endoscopy;  Laterality: N/A;   STENT REMOVAL  01/22/2019   Procedure: STENT REMOVAL;  Surgeon: Rosalie Kitchens, MD;  [facility];  Service: Endoscopy;;   STENT REMOVAL  02/18/2019   Procedure: STENT REMOVAL;  Surgeon: Rosalie Kitchens, MD;  [facility];  Service: Endoscopy;;   Allergies  Allergen Reactions   Codeine Hives   Statins Nausea Only    Cramps (ALLERGY/intolerance)   Allopurinol Rash    Unknown   Doxycycline Rash    Unknown     Physical Exam: General: The patient is alert and oriented x3 in no acute distress.  Dermatology: Area of bleeding is superficial abrasion noted right first toe dorsal aspect  Vascular: +2 pitting edema present bilaterally, difficult to palpate DP and PT pulses due to this however feet appear warm and well-perfused.  Diminished pedal hair growth.  Neurological: Epicritic sensation is intact  Musculoskeletal Exam:  There is pain on palpation of the bilateral PT tendon and plantar medial arch.  There is also pain on palpation of of the plantarmedial & plantarcentral aspect of bilateral heel.  No gaps or nodules within the plantar fascia.  Positive Windlass mechanism bilateral.  Antalgic gait noted with first few steps upon standing.  No pain on palpation of achilles tendon bilateral.  Ankle df less than 10 degrees with knee extended b/l.  Pes planus foot type noted bilaterally.  Radiographic Exam: Left and right foot radiographs 3 views weightbearing 03/15/2024 Normal osseous mineralization. Joint spaces preserved.  No fractures noted.  Left foot pes planus foot type noted.  Generalized osteopenia noted.  Some talar head uncoverage present.  Right foot there  is similar findings retains overall foot morphology.  There is also significant digital deformities of the first toe and lesser toes with first toe mallet toe deformity and lesser toe hammertoe deformities present.  Assessment/Plan of Care: 1. Plantar fasciitis, bilateral   2. Posterior tibial tendon dysfunction (PTTD) of both lower extremities   3. Bilateral leg edema     Meds ordered this encounter  Medications   triamcinolone  acetonide (KENALOG ) 10 MG/ML injection 10 mg   None  -Reviewed etiology of PTTD with pes planus and plantar fasciitis with patient.  Radiographs are reviewed with patient.  Discussed treatment options with patient today, including cortisone injection, NSAID course of treatment, stretching exercises, physical therapy, use of night splint, rest, icing the heel, arch supports/orthotics, and supportive shoe gear.  We will avoid over-the-counter NSAIDs due to patient's history of CKD.  With the patient's verbal consent, a corticosteroid injection was administered to the bilateral PT tendon sheath at level of the navicular  insertion, consisting of a mixture of 1% lidocaine  plain, 0.5% Sensorcaine  plain, and Kenalog -10 for a total of 1.5cc administered to the left and the right foot each.   Stretching exercises were discussed at length with the patient with written instructions dispensed  Powerstep inserts were recommended, he will pick these up at a later time due to wearing poorly fitting shoes today.  Right first toe abrasion was dressed with Bactroban  and bandage today.  Return in about 3 weeks (around 04/05/2024) for PTTD.   Lakara Weiland L. Lamount MAUL, AACFAS [facility]     [address] [place], [place] [address]                Office [phone]  Fax [phone]

## 2024-03-16 ENCOUNTER — Ambulatory Visit: Payer: Self-pay | Admitting: Cardiology

## 2024-03-22 ENCOUNTER — Ambulatory Visit: Attending: Cardiology | Admitting: Cardiology

## 2024-03-22 ENCOUNTER — Encounter: Payer: Self-pay | Admitting: Cardiology

## 2024-03-22 VITALS — BP 114/74 | HR 71 | Ht 76.0 in | Wt 225.6 lb

## 2024-03-22 DIAGNOSIS — R0609 Other forms of dyspnea: Secondary | ICD-10-CM | POA: Diagnosis not present

## 2024-03-22 DIAGNOSIS — I10 Essential (primary) hypertension: Secondary | ICD-10-CM | POA: Diagnosis not present

## 2024-03-22 DIAGNOSIS — I5032 Chronic diastolic (congestive) heart failure: Secondary | ICD-10-CM | POA: Diagnosis not present

## 2024-03-22 DIAGNOSIS — E782 Mixed hyperlipidemia: Secondary | ICD-10-CM | POA: Diagnosis not present

## 2024-03-22 NOTE — Patient Instructions (Signed)

## 2024-03-22 NOTE — Progress Notes (Signed)
 Cardiology Office Note:    Date:  03/22/2024   ID:  Brandon JINNY Phyliss Mickey., DOB June 08, 1941, MRN [id number]  PCP:  Thurmond Cathlyn LABOR., MD  Cardiologist:  Lamar Fitch, MD    Referring MD: Thurmond Cathlyn LABOR., MD   Chief Complaint  Patient presents with   Follow-up    History of Present Illness:    Brandon Missey. is a 83 y.o. male past medical history significant for essential hypertension, PE/DVT, dyslipidemia, chronic kidney failure, diastolic congestive heart failure was referred to us  because of swollen legs.  Echocardiogram does show preserved ejection fraction and normal diastolic function, he supposed to have proBNP Chem-7 done but for some reason it has not been done comes to the discussed this issue.  Swelling still bothering likely no leaking lesions,  Past Medical History:  Diagnosis Date   Benign prostatic hyperplasia (BPH) with urinary urgency    Bile duct stone    Cervical disc disease    CHF (congestive heart failure) (HCC)    Chronic kidney disease (CKD), stage III (moderate) (HCC)    pt unaware   Coronary artery disease    pt unaware   Diabetes mellitus without complication (HCC)    Diabetes, polyneuropathy (HCC)    Diastolic dysfunction    ED (erectile dysfunction)    Edema    Elevated hemidiaphragm    Elevated liver function tests    Gait abnormality    Gallstones    GERD (gastroesophageal reflux disease)    Gout    History of anticoagulant therapy    History of rib fracture    Hyperlipidemia    Hypertension    Lung mass    Major depression    Malaise and fatigue    Mouth pain    OA (osteoarthritis)    Pneumonia    history of   Prostate disease    Pulmonary emboli (HCC)    Sleep apnea    does not use CPAP, resolved   Stool incontinence    Testosterone deficiency    Vitamin B 12 deficiency    Weight loss     Past Surgical History:  Procedure Laterality Date   BILIARY DILATION  01/22/2019   Procedure: BILIARY DILATION;  Surgeon: Rosalie Kitchens, MD;  [facility];  Service: Endoscopy;;   BILIARY STENT PLACEMENT N/A 01/01/2019   Procedure: BILIARY STENT PLACEMENT;  Surgeon: Burnette Fallow, MD;  [facility];  Service: Endoscopy;  Laterality: N/A;   BILIARY STENT PLACEMENT N/A 01/22/2019   Procedure: BILIARY STENT PLACEMENT;  Surgeon: Rosalie Kitchens, MD;  [facility];  Service: Endoscopy;  Laterality: N/A;   CARDIAC CATHETERIZATION  2014   ERCP N/A 01/01/2019   Procedure: ENDOSCOPIC RETROGRADE CHOLANGIOPANCREATOGRAPHY (ERCP);  Surgeon: Burnette Fallow, MD;  Location: [facility];  Service: Endoscopy;  Laterality: N/A;   ERCP N/A 01/22/2019   Procedure: ENDOSCOPIC RETROGRADE CHOLANGIOPANCREATOGRAPHY (ERCP);  Surgeon: Rosalie Kitchens, MD;  Location: [facility];  Service: Endoscopy;  Laterality: N/A;  with spyglass and lithrotipsy   ERCP N/A 02/18/2019   Procedure: ENDOSCOPIC RETROGRADE CHOLANGIOPANCREATOGRAPHY (ERCP);  Surgeon: Rosalie Kitchens, MD;  Location: [facility];  Service: Endoscopy;  Laterality: N/A;  WITH SPYGLASS    LAPAROSCOPIC CHOLECYSTECTOMY SINGLE SITE WITH INTRAOPERATIVE CHOLANGIOGRAM N/A 12/31/2018   Procedure: LAPAROSCOPIC CHOLECYSTECTOMY SINGLE SITE WITH INTRAOPERATIVE CHOLANGIOGRAM, REPAIR OF UMBILICAL HERNIA;  Surgeon: Sheldon Standing, MD;  [facility];  Service: General;  Laterality: N/A;   REMOVAL OF STONES  01/01/2019   Procedure: REMOVAL OF  STONES;  Surgeon: Burnette Fallow, MD;  Location: [facility];  Service: Endoscopy;;   REMOVAL OF STONES  01/22/2019   Procedure: REMOVAL OF STONES;  Surgeon: Rosalie Kitchens, MD;  [facility];  Service: Endoscopy;;   REMOVAL OF STONES  02/18/2019   Procedure: REMOVAL OF STONES;  Surgeon: Rosalie Kitchens, MD;  [facility];  Service: Endoscopy;;   right hip replacement     SPHINCTEROTOMY  01/01/2019   Procedure: ANNETT;  Surgeon: Burnette Fallow, MD;  [facility];  Service: Endoscopy;;   SPHINCTEROTOMY  01/22/2019    Procedure: ANNETT;  Surgeon: Rosalie Kitchens, MD;  [facility];  Service: Endoscopy;;   SPHINCTEROTOMY  02/18/2019   Procedure: ANNETT;  Surgeon: Rosalie Kitchens, MD;  [facility];  Service: Endoscopy;;   SPYGLASS CHOLANGIOSCOPY N/A 01/22/2019   Procedure: DEBHOJDD CHOLANGIOSCOPY;  Surgeon: Rosalie Kitchens, MD;  [facility];  Service: Endoscopy;  Laterality: N/A;   SPYGLASS LITHOTRIPSY N/A 01/22/2019   Procedure: DEBHOJDD LITHOTRIPSY;  Surgeon: Rosalie Kitchens, MD;  [facility];  Service: Endoscopy;  Laterality: N/A;   STENT REMOVAL  01/22/2019   Procedure: STENT REMOVAL;  Surgeon: Rosalie Kitchens, MD;  [facility];  Service: Endoscopy;;   STENT REMOVAL  02/18/2019   Procedure: STENT REMOVAL;  Surgeon: Rosalie Kitchens, MD;  [facility];  Service: Endoscopy;;    Current Medications: Current Meds  Medication Sig   acetaminophen  (TYLENOL ) 325 MG tablet Give 3 tablets   amoxicillin  (AMOXIL ) 500 MG capsule Take 1 capsule (500 mg total) by mouth 3 (three) times daily.   clotrimazole-betamethasone (LOTRISONE) cream Apply sparingly to rash twice daily as needed.   DULoxetine  (CYMBALTA ) 30 MG capsule Take 30 mg by mouth every evening.   ergocalciferol (VITAMIN D2) 1.25 MG (50000 UT) capsule Take 50,000 Units by mouth every Monday.   fenofibrate  160 MG tablet Take 160 mg by mouth at bedtime.   finasteride  (PROSCAR ) 5 MG tablet Take 1 tablet by mouth daily.   fosinopril (MONOPRIL) 10 MG tablet Take 10 mg by mouth daily.   furosemide  (LASIX ) 40 MG tablet Take by mouth.   lidocaine  (LIDODERM ) 5 % Place 1 patch onto the skin every 12 (twelve) hours as needed (pain). Remove & Discard patch within 12 hours or as directed by MD   mupirocin  ointment (BACTROBAN ) 2 % Apply 1 Application topically 2 (two) times daily.   omeprazole (PRILOSEC) 10 MG capsule Take 10 mg by mouth daily.   oxybutynin  (DITROPAN ) 5 MG tablet Take 1 tablet (5 mg total) by mouth 2  (two) times daily.   pantoprazole  (PROTONIX ) 20 MG tablet Take 20 mg by mouth daily.   polyethylene glycol (MIRALAX  / GLYCOLAX ) 17 g packet Take 17 g by mouth daily.   Probiotic Product (ALIGN PO) Take 1 capsule by mouth daily.   senna-docusate (SENOKOT-S) 8.6-50 MG tablet Take 2 tablets by mouth 2 (two) times daily.   silver  sulfADIAZINE  (SILVADENE ) 1 % cream Apply 1 Application topically daily.   tamsulosin  (FLOMAX ) 0.4 MG CAPS capsule Take 0.4 mg by mouth daily.   vitamin B-12 1000 MCG tablet Take 1 tablet (1,000 mcg total) by mouth daily.   warfarin (COUMADIN ) 5 MG tablet Take 1 tablet (5 mg total) by mouth daily. (Patient taking differently: Take 5 mg by mouth every evening.)   Current Facility-Administered Medications for the 03/22/24 encounter (Office Visit) with Brandon Gruenhagen J, MD  Medication   triamcinolone  acetonide (KENALOG ) 10 MG/ML injection 10 mg  Allergies:   Codeine, Statins, Allopurinol, and Doxycycline   Social History   Socioeconomic History   Marital status: Married    Spouse name: Apolinar   Number of children: 1   Years of education: 12   Highest education level: 12th grade  Occupational History   Not on file  Tobacco Use   Smoking status: Former    Current packs/day: 0.00    Average packs/day: 0.3 packs/day for 10.0 years (2.5 ttl pk-yrs)    Types: Cigarettes    Start date: 69    Quit date: 89    Years since quitting: 38.5   Smokeless tobacco: Never   Tobacco comments:    declined  Vaping Use   Vaping status: Never Used  Substance and Sexual Activity   Alcohol use: Never   Drug use: Never   Sexual activity: Not Currently  Other Topics Concern   Not on file  Social History Narrative   HOH, embarrassed to use a cane.  Does not go out much.    Social Drivers of Corporate investment banker Strain: Low Risk  (12/28/2018)   Overall Financial Resource Strain (CARDIA)    Difficulty of Paying Living Expenses: Not very hard  Food Insecurity:  Low Risk  (12/17/2023)   Received from [facility]   Hunger Vital Sign    Within the past 12 months, you worried that your food would run out before you got money to buy more: Never true    Within the past 12 months, the food you bought just didn't last and you didn't have money to get more. : Never true  Transportation Needs: No Transportation Needs (12/17/2023)   Received from [facility]    In the past 12 months, has lack of reliable transportation kept you from medical appointments, meetings, work or from getting things needed for daily living? : No  Physical Activity: Inactive (12/28/2018)   Exercise Vital Sign    Days of Exercise per Week: 0 days    Minutes of Exercise per Session: 0 min  Stress: No Stress Concern Present (12/28/2018)   [employer] of Occupational Health - Occupational Stress Questionnaire    Feeling of Stress : Only a little  Social Connections: Unknown (01/07/2022)   Received from [facility]   Social Network    Social Network: Not on file     Family History: The patient's family history includes Heart disease in his father. ROS:   Please see the history of present illness.    All 14 point review of systems negative except as described per history of present illness  EKGs/Labs/Other Studies Reviewed:         Recent Labs: No results found for requested labs within last 365 days.  Recent Lipid Panel No results found for: CHOL, TRIG, HDL, CHOLHDL, VLDL, LDLCALC, LDLDIRECT  Physical Exam:    VS:  BP 114/74 (BP Location: Right Arm, Patient Position: Sitting)   Pulse 71   Ht 6' 4 (1.93 m)   Wt 225 lb 9.6 oz (102.3 kg)   SpO2 95%   BMI 27.46 kg/m     Wt Readings from Last 3 Encounters:  03/22/24 225 lb 9.6 oz (102.3 kg)  02/18/24 221 lb 6.4 oz (100.4 kg)  02/18/19 223 lb (101.2 kg)     GEN:  Well nourished, well developed in no acute distress HEENT: Normal NECK: No JVD; No carotid bruits LYMPHATICS: No  lymphadenopathy CARDIAC: RRR, no murmurs, no rubs, no  gallops RESPIRATORY:  Clear to auscultation without rales, wheezing or rhonchi  ABDOMEN: Soft, non-tender, non-distended MUSCULOSKELETAL:  No edema; No deformity  SKIN: Warm and dry LOWER EXTREMITIES: Plus swelling NEUROLOGIC:  Alert and oriented x 3 PSYCHIATRIC:  Normal affect   ASSESSMENT:    1. Dyspnea on exertion   2. Essential hypertension   3. Chronic diastolic CHF (congestive heart failure) (HCC)   4. 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  He ended up having PE bilaterally and I suspect of course from the show he DVT.  Echocardiogram revealed normal systolic diastolic function will get proBNP as well as Chem-7 based on that we decide if we can try to do some diuretic for cosmetic reasons.  In the future he can benefit from elastic stockings. Essential hypertension blood pressure seems to well-controlled continue present management. Chronic diastolic congestive Again we will get proBNP for better assessment. Mixed dyslipidemia I do not have a recent fasting lipid profile   Medication Adjustments/Labs and Tests Ordered: Current medicines are reviewed at length with the patient today.  Concerns regarding medicines are outlined above.  Orders Placed This Encounter  Procedures   Basic metabolic panel with GFR   Pro b natriuretic peptide (BNP)   Medication changes: No orders of the defined types were placed in this encounter.   Signed, Lamar DOROTHA Fitch, MD, [facility] 03/22/2024 4:54 PM    [facility]

## 2024-03-23 LAB — BASIC METABOLIC PANEL WITH GFR
BUN/Creatinine Ratio: 19 (ref 10–24)
BUN: 21 mg/dL (ref 8–27)
CO2: 22 mmol/L (ref 20–29)
Calcium: 9.4 mg/dL (ref 8.6–10.2)
Chloride: 105 mmol/L (ref 96–106)
Creatinine, Ser: 1.09 mg/dL (ref 0.76–1.27)
Glucose: 154 mg/dL — ABNORMAL HIGH (ref 70–99)
Potassium: 5 mmol/L (ref 3.5–5.2)
Sodium: 141 mmol/L (ref 134–144)
eGFR: 68 mL/min/1.73 (ref >59)

## 2024-03-23 LAB — PRO B NATRIURETIC PEPTIDE: NT-Pro BNP: 227 pg/mL (ref 0–486)

## 2024-03-24 ENCOUNTER — Telehealth: Payer: Self-pay | Admitting: Cardiology

## 2024-03-24 NOTE — Telephone Encounter (Signed)
  Per pt he will be out to do some errands and requested to call him back in the afternoon

## 2024-03-24 NOTE — Telephone Encounter (Signed)
  Pt calling to follow up lab result

## 2024-03-24 NOTE — Telephone Encounter (Signed)
 Called the patient and he was asking about the results of his lab work that was done on Monday (03/22/24). I explained that the lab results had not been interpreted at this time. Patient verbalized understanding and had no further questions at this time.

## 2024-03-25 ENCOUNTER — Ambulatory Visit: Payer: Self-pay | Admitting: Cardiology

## 2024-03-30 ENCOUNTER — Telehealth: Payer: Self-pay

## 2024-03-30 NOTE — Telephone Encounter (Signed)
 Lab Results reviewed with pt as per Dr. Karry note. Pt stated that he does not like to take Lasix  because it makes him pee. Encouraged pt to increase Lasix  to 60mg  daily and have BMP in 1 week. Pt verbalized understanding and had no additional questions. Routed to PCP

## 2024-03-30 NOTE — Telephone Encounter (Signed)
 Lab Results reviewed with Brandon [name] as per Dr. Karry note. Brandon [name] stated that he does not like to take Lasix  because it makes him pee. Encouraged Brandon [name] to increase Lasix  to 60mg  daily and have BMP in 1 week. Brandon [name] verbalized understanding and had no additional questions. Routed to PCP

## 2024-04-06 ENCOUNTER — Ambulatory Visit: Admitting: Podiatry

## 2024-04-06 ENCOUNTER — Encounter: Payer: Self-pay | Admitting: Podiatry

## 2024-04-06 DIAGNOSIS — E1142 Type 2 diabetes mellitus with diabetic polyneuropathy: Secondary | ICD-10-CM

## 2024-04-06 DIAGNOSIS — M76822 Posterior tibial tendinitis, left leg: Secondary | ICD-10-CM | POA: Diagnosis not present

## 2024-04-06 DIAGNOSIS — I872 Venous insufficiency (chronic) (peripheral): Secondary | ICD-10-CM | POA: Diagnosis not present

## 2024-04-06 DIAGNOSIS — M76821 Posterior tibial tendinitis, right leg: Secondary | ICD-10-CM | POA: Diagnosis not present

## 2024-04-06 MED ORDER — DICLOFENAC SODIUM 1 % EX GEL: 2.0000 g | Freq: Four times a day (QID) | CUTANEOUS | 0 refills | Status: AC

## 2024-04-06 NOTE — Patient Instructions (Addendum)
 Performed posterior tibial tendon rehab exercise 2-3 times a day.  Is important that you use good supportive shoe gear.  I recommend athletic shoes with good arch support such as Burnetta, Hoka's, ASICS, new balance, Altra.  Sketchers Proarch 2 is a good-quality shoe as well may be easier to put on.  Will also benefit from over-the-counter arch supports in the meantime until diabetic shoes are made.  I recommend power steps Pro-Tech full-length for moderate to severe pronation.  You can be found online.  I am sending referral to [facility] clinic for diabetic shoes and for custom compression stockings.  Be on the look out for them to contact you by phone or by mail.  Unfortunately, I cannot give you oral NSAIDs due to history of kidney disease.  He may benefit from over-the-counter Voltaren  gel massage into the affected area 3-4 times a day.  You can also try massaging the areas with frozen water  bottles of the bottoms of your feet in addition to your physical therapy.

## 2024-04-06 NOTE — Progress Notes (Signed)
 Chief Complaint  Patient presents with   PTTD Follow up.     PTTD follow up. Callous present, A1c was 6.9 in June. Takes warrfirin. He had blood work today, and he is taking a bunch of fluid pills, to help with swelling. He is having pain when he walks on the bottoms of his feet.     HPI: 83 y.o. male presenting today with continued pain in the bilateral medial arches and bottom of the bilateral heel.  He reports that this has been very painful and has limits his ability to weight-bear.  Last visit he had corticosteroid injections to the PT tendons which were helpful for a period of time.  He still comes in wearing shoes with minimal arch support and has not obtained over-the-counter arch supports.  He does report ongoing pain due to this.  He also complains of continued swelling.  He is recently seen cardiologist.  He has started oral Lasix .  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  He is diabetic with neuropathy. Past Medical History:  Diagnosis Date   Benign prostatic hyperplasia (BPH) with urinary urgency    Bile duct stone    Cervical disc disease    CHF (congestive heart failure) (HCC)    Chronic kidney disease (CKD), stage III (moderate) (HCC)    pt unaware   Coronary artery disease    pt unaware   Diabetes mellitus without complication (HCC)    Diabetes, polyneuropathy (HCC)    Diastolic dysfunction    ED (erectile dysfunction)    Edema    Elevated hemidiaphragm    Elevated liver function tests    Gait abnormality    Gallstones    GERD (gastroesophageal reflux disease)    Gout    History of anticoagulant therapy    History of rib fracture    Hyperlipidemia    Hypertension    Lung mass    Major depression    Malaise and fatigue    Mouth pain    OA (osteoarthritis)    Pneumonia    history of   Prostate disease    Pulmonary emboli (HCC)    Sleep apnea    does not use CPAP, resolved   Stool incontinence    Testosterone deficiency    Vitamin B 12  deficiency    Weight loss    Past Surgical History:  Procedure Laterality Date   BILIARY DILATION  01/22/2019   Procedure: BILIARY DILATION;  Surgeon: Rosalie Kitchens, MD;  Location: [facility];  Service: Endoscopy;;   BILIARY STENT PLACEMENT N/A 01/01/2019   Procedure: BILIARY STENT PLACEMENT;  Surgeon: Burnette Fallow, MD;  [facility]: [facility];  Service: Endoscopy;  Laterality: N/A;   BILIARY STENT PLACEMENT N/A 01/22/2019   Procedure: BILIARY STENT PLACEMENT;  Surgeon: Rosalie Kitchens, MD;  Location: [facility];  Service: Endoscopy;  Laterality: N/A;   CARDIAC CATHETERIZATION  2014   ERCP N/A 01/01/2019   Procedure: ENDOSCOPIC RETROGRADE CHOLANGIOPANCREATOGRAPHY (ERCP);  Surgeon: Burnette Fallow, MD;  Location: [facility];  Service: Endoscopy;  Laterality: N/A;   ERCP N/A 01/22/2019   Procedure: ENDOSCOPIC RETROGRADE CHOLANGIOPANCREATOGRAPHY (ERCP);  Surgeon: Rosalie Kitchens, MD;  Location: [facility];  Service: Endoscopy;  Laterality: N/A;  with spyglass and lithrotipsy   ERCP N/A 02/18/2019   Procedure: ENDOSCOPIC RETROGRADE CHOLANGIOPANCREATOGRAPHY (ERCP);  Surgeon: Rosalie Kitchens, MD;  Location: [facility];  Service: Endoscopy;  Laterality: N/A;  WITH SPYGLASS    LAPAROSCOPIC CHOLECYSTECTOMY SINGLE SITE WITH INTRAOPERATIVE CHOLANGIOGRAM N/A 12/31/2018   Procedure:  LAPAROSCOPIC CHOLECYSTECTOMY SINGLE SITE WITH INTRAOPERATIVE CHOLANGIOGRAM, REPAIR OF UMBILICAL HERNIA;  Surgeon: Sheldon Standing, MD;  Location: [facility];  Service: General;  Laterality: N/A;   REMOVAL OF STONES  01/01/2019   Procedure: REMOVAL OF STONES;  Surgeon: Burnette Fallow, MD;  [facility]: [facility];  Service: Endoscopy;;   REMOVAL OF STONES  01/22/2019   Procedure: REMOVAL OF STONES;  Surgeon: Rosalie Kitchens, MD;  [facility];  Service: Endoscopy;;   REMOVAL OF STONES  02/18/2019   Procedure: REMOVAL OF STONES;  Surgeon: Rosalie Kitchens, MD;  [facility];  Service: Endoscopy;;   right hip replacement      SPHINCTEROTOMY  01/01/2019   Procedure: ANNETT;  Surgeon: Burnette Fallow, MD;  [facility];  Service: Endoscopy;;   SPHINCTEROTOMY  01/22/2019   Procedure: ANNETT;  Surgeon: Rosalie Kitchens, MD;  [facility];  Service: Endoscopy;;   SPHINCTEROTOMY  02/18/2019   Procedure: ANNETT;  Surgeon: Rosalie Kitchens, MD;  [facility];  Service: Endoscopy;;   SPYGLASS CHOLANGIOSCOPY N/A 01/22/2019   Procedure: DEBHOJDD CHOLANGIOSCOPY;  Surgeon: Rosalie Kitchens, MD;  [facility];  Service: Endoscopy;  Laterality: N/A;   SPYGLASS LITHOTRIPSY N/A 01/22/2019   Procedure: DEBHOJDD LITHOTRIPSY;  Surgeon: Rosalie Kitchens, MD;  [facility];  Service: Endoscopy;  Laterality: N/A;   STENT REMOVAL  01/22/2019   Procedure: STENT REMOVAL;  Surgeon: Rosalie Kitchens, MD;  [facility];  Service: Endoscopy;;   STENT REMOVAL  02/18/2019   Procedure: STENT REMOVAL;  Surgeon: Rosalie Kitchens, MD;  [facility];  Service: Endoscopy;;   Allergies  Allergen Reactions   Codeine Hives   Statins Nausea Only    Cramps (ALLERGY/intolerance)   Allopurinol Rash    Unknown   Doxycycline Rash    Unknown     Physical Exam: General: The patient is alert and oriented x3 in no acute distress.  Dermatology: Area of bleeding is superficial abrasion noted right first toe dorsal aspect  Vascular: +2 pitting edema present bilaterally, difficult to palpate DP and PT pulses due to this however feet appear warm and well-perfused.  Diminished pedal hair growth.  Skin changes to bilateral lower extremities consistent with chronic venous insufficiency.  There are some lower extremity blisters forming, no active drainage or weeping at this point in time.  Left calf circumference 38 cm, right calf circumference 40 cm.  Neurological: Epicritic sensation is intact.  Protective sensation decreased.  Vibratory sensation decreased.  Musculoskeletal Exam:  There is pain on  palpation of the bilateral PT tendon and plantar medial arch.  There is also pain on palpation of of the plantarmedial & plantarcentral aspect of bilateral heel.  No gaps or nodules within the plantar fascia.  Positive Windlass mechanism bilateral.  Antalgic gait noted with first few steps upon standing.  No pain on palpation of achilles tendon bilateral.  Ankle df less than 10 degrees with knee extended b/l.  Pes planus foot type noted bilaterally.  Radiographic Exam: Left and right foot radiographs 3 views weightbearing 03/15/2024 Normal osseous mineralization. Joint spaces preserved.  No fractures noted.  Left foot pes planus foot type noted.  Generalized osteopenia noted.  Some talar head uncoverage present.  Right foot there is similar findings retains overall foot morphology.  There is also significant digital deformities of the first toe and lesser toes with first toe mallet toe deformity and lesser toe hammertoe deformities present.  Assessment/Plan of Care: 1. Posterior tibial tendon dysfunction (PTTD) of both lower extremities  2. Diabetic polyneuropathy associated with type 2 diabetes mellitus (HCC)   3. Chronic venous insufficiency of lower extremity     No orders of the defined types were placed in this encounter.  FOR HOME USE ONLY DME DIABETIC SHOE FOR HOME USE ONLY DME OTHER SEE COMMENT  # Bilateral PTTD with pes planus # Diabetes with neuropathy -Reviewed etiology of PTTD with pes planus and plantar fasciitis with patient.  Radiographs are reviewed with patient.  Discussed treatment options with patient today, including cortisone injection, NSAID course of treatment, stretching exercises, physical therapy, use of night splint, rest, icing the heel, arch supports/orthotics, and supportive shoe gear.  We will avoid p.o. NSAIDs due to patient's history of CKD. - He may get some benefit from topical Voltaren  gel massage into the affected areas 3-4 times a day.  Deferring a second  corticosteroid injection to the PT tendons today, can get one in the future if symptoms do not improve.  He did have some relief of this.  Stressed the importance of continued stretching regimen, use of good supportive shoes.  Strongly recommended the use of quality of over-the-counter orthotics  Powerstep inserts were recommended, he does wear size 14, unfortunately do not carry his size.  Information given to patient so that he can find some outside clinic.  Given the bilateral pes planus and diabetes with neuropathy, significant swelling to both lower extremities, reviewed benefit from custom shoes with multidensity diabetic inserts.  While these are accommodative, I do think these will also provide some improve arch support versus what he routinely wears and may help alleviate the arch pain and medial ankle. - Prescription sent sent to [facility] clinic for this  # Chronic venous insufficiency - Is currently seeing Dr. Krasowski for this, he has been taking Lasix . - Did explain to patient that he needs mechanical compression to help prevent fluid buildup in the legs as well. - Prescription sent to [facility] clinic for compression stockings for bilateral lower extremities.  Left calf circumference 38 cm, right calf circumference 40 cm. - Prescription for compression stockings for 20 to 30 mmHg compression gradient - Patient expresses concern with ability to apply compression stockings.  If we do not see any improvement when patient follows up, may consider trying to obtain lymphedema pumps for patient    Return in about 4-6 weeks (around 05/04/2024) for PTTD/DFC   Taneisha Fuson L. Lamount MAUL, AACFAS [facility]     [address] [place], [place] [address]                Office [phone]  Fax [phone]

## 2024-04-07 LAB — BASIC METABOLIC PANEL WITH GFR
BUN/Creatinine Ratio: 21 (ref 10–24)
BUN: 23 mg/dL (ref 8–27)
CO2: 25 mmol/L (ref 20–29)
Calcium: 9.6 mg/dL (ref 8.6–10.2)
Chloride: 97 mmol/L (ref 96–106)
Creatinine, Ser: 1.11 mg/dL (ref 0.76–1.27)
Glucose: 112 mg/dL — ABNORMAL HIGH (ref 70–99)
Potassium: 4.2 mmol/L (ref 3.5–5.2)
Sodium: 137 mmol/L (ref 134–144)
eGFR: 67 mL/min/1.73 (ref >59)

## 2024-04-07 LAB — PRO B NATRIURETIC PEPTIDE: NT-Pro BNP: 120 pg/mL (ref 0–486)

## 2024-04-12 ENCOUNTER — Ambulatory Visit: Admitting: Podiatry

## 2024-04-12 VITALS — BP 142/72 | HR 88 | Resp 16

## 2024-04-12 DIAGNOSIS — I872 Venous insufficiency (chronic) (peripheral): Secondary | ICD-10-CM

## 2024-04-12 DIAGNOSIS — L03119 Cellulitis of unspecified part of limb: Secondary | ICD-10-CM | POA: Diagnosis not present

## 2024-04-12 DIAGNOSIS — L02619 Cutaneous abscess of unspecified foot: Secondary | ICD-10-CM

## 2024-04-12 NOTE — Progress Notes (Signed)
 Chief Complaint  Patient presents with   Skin Discoloration    Made urgent apt for today for discoloration on the tops of feet bilaterally. He is complaining of tightness and pain in the lateral side of the right leg. Both legs are really swollen and the right leg is bright red and hot to touch. Vitals have been taken. He is worried about a blood clot.  There is red like spots on the left foot at the toes.     HPI: 83 y.o. male presents today with concern for worsening swelling and redness and his legs, right greater than left.  Also concerned about new bruising to the top of his left foot.  Does have more blistering present to the lower legs.  Has been dealing with bilateral foot pain which she was recently seen for and this has made it difficult for him to weight-bear.  Patient has concerned about potential blood clot right lower extremity, he does have history of prior PE.  He does report feeling short of breath and fatigue, denies fever. Hasn't been able to start compression stockings at this point. Reports feeling unsteady on  his feet and stumbling twice on his way into the office today.  Past Medical History:  Diagnosis Date   Benign prostatic hyperplasia (BPH) with urinary urgency    Bile duct stone    Cervical disc disease    CHF (congestive heart failure) (HCC)    Chronic kidney disease (CKD), stage III (moderate) (HCC)    pt unaware   Coronary artery disease    pt unaware   Diabetes mellitus without complication (HCC)    Diabetes, polyneuropathy (HCC)    Diastolic dysfunction    ED (erectile dysfunction)    Edema    Elevated hemidiaphragm    Elevated liver function tests    Gait abnormality    Gallstones    GERD (gastroesophageal reflux disease)    Gout    History of anticoagulant therapy    History of rib fracture    Hyperlipidemia    Hypertension    Lung mass    Major depression    Malaise and fatigue    Mouth pain    OA (osteoarthritis)    Pneumonia     history of   Prostate disease    Pulmonary emboli (HCC)    Sleep apnea    does not use CPAP, resolved   Stool incontinence    Testosterone deficiency    Vitamin B 12 deficiency    Weight loss     Past Surgical History:  Procedure Laterality Date   BILIARY DILATION  01/22/2019   Procedure: BILIARY DILATION;  Surgeon: Rosalie Kitchens, MD;  [facility];  Service: Endoscopy;;   BILIARY STENT PLACEMENT N/A 01/01/2019   Procedure: BILIARY STENT PLACEMENT;  Surgeon: Burnette Fallow, MD;  [facility];  Service: Endoscopy;  Laterality: N/A;   BILIARY STENT PLACEMENT N/A 01/22/2019   Procedure: BILIARY STENT PLACEMENT;  Surgeon: Rosalie Kitchens, MD;  [facility]: [facility];  Service: Endoscopy;  Laterality: N/A;   CARDIAC CATHETERIZATION  2014   ERCP N/A 01/01/2019   Procedure: ENDOSCOPIC RETROGRADE CHOLANGIOPANCREATOGRAPHY (ERCP);  Surgeon: Burnette Fallow, MD;  Location: [facility];  Service: Endoscopy;  Laterality: N/A;   ERCP N/A 01/22/2019   Procedure: ENDOSCOPIC RETROGRADE CHOLANGIOPANCREATOGRAPHY (ERCP);  Surgeon: Rosalie Kitchens, MD;  Location: [facility];  Service: Endoscopy;  Laterality: N/A;  with spyglass and lithrotipsy   ERCP N/A 02/18/2019  Procedure: ENDOSCOPIC RETROGRADE CHOLANGIOPANCREATOGRAPHY (ERCP);  Surgeon: Rosalie Kitchens, MD;  Location: [facility];  Service: Endoscopy;  Laterality: N/A;  WITH SPYGLASS    LAPAROSCOPIC CHOLECYSTECTOMY SINGLE SITE WITH INTRAOPERATIVE CHOLANGIOGRAM N/A 12/31/2018   Procedure: LAPAROSCOPIC CHOLECYSTECTOMY SINGLE SITE WITH INTRAOPERATIVE CHOLANGIOGRAM, REPAIR OF UMBILICAL HERNIA;  Surgeon: Sheldon Standing, MD;  Location: [facility];  Service: General;  Laterality: N/A;   REMOVAL OF STONES  01/01/2019   Procedure: REMOVAL OF STONES;  Surgeon: Burnette Fallow, MD;  [facility];  Service: Endoscopy;;   REMOVAL OF STONES  01/22/2019   Procedure: REMOVAL OF STONES;  Surgeon: Rosalie Kitchens, MD;  [facility];  Service: Endoscopy;;    REMOVAL OF STONES  02/18/2019   Procedure: REMOVAL OF STONES;  Surgeon: Rosalie Kitchens, MD;  [facility];  Service: Endoscopy;;   right hip replacement     SPHINCTEROTOMY  01/01/2019   Procedure: ANNETT;  Surgeon: Burnette Fallow, MD;  [facility];  Service: Endoscopy;;   SPHINCTEROTOMY  01/22/2019   Procedure: ANNETT;  Surgeon: Rosalie Kitchens, MD;  [facility];  Service: Endoscopy;;   SPHINCTEROTOMY  02/18/2019   Procedure: ANNETT;  Surgeon: Rosalie Kitchens, MD;  [facility];  Service: Endoscopy;;   SPYGLASS CHOLANGIOSCOPY N/A 01/22/2019   Procedure: DEBHOJDD CHOLANGIOSCOPY;  Surgeon: Rosalie Kitchens, MD;  [facility];  Service: Endoscopy;  Laterality: N/A;   SPYGLASS LITHOTRIPSY N/A 01/22/2019   Procedure: DEBHOJDD LITHOTRIPSY;  Surgeon: Rosalie Kitchens, MD;  [facility];  Service: Endoscopy;  Laterality: N/A;   STENT REMOVAL  01/22/2019   Procedure: STENT REMOVAL;  Surgeon: Rosalie Kitchens, MD;  [facility];  Service: Endoscopy;;   STENT REMOVAL  02/18/2019   Procedure: STENT REMOVAL;  Surgeon: Rosalie Kitchens, MD;  [facility];  Service: Endoscopy;;    Allergies  Allergen Reactions   Codeine Hives   Statins Nausea Only    Cramps (ALLERGY/intolerance)   Allopurinol Rash    Unknown   Doxycycline Rash    Unknown    ROS Reports shortness of breath and fatigue.  Denies nausea, vomiting, fever, chills, chest pain   Physical Exam: Vitals:   04/12/24 1037  BP: (!) 142/72  Pulse: 88  Resp: 16  SpO2: 96%    General: The patient is alert and oriented x3 in no acute distress.  Dermatology: Some ecchymosis noted dorsal left forefoot involving the toes as well.  Chronic lymphedema findings present bilaterally with water  blister formation to lower legs.  Vascular: +2 pitting edema present bilaterally, difficult to palpate DP and PT pulses.  Profound erythema present right mid leg, somewhat distally  involving the foot as well.  Difficult to determine if this is dependent or not.  Accompanying warmth present right lower leg and foot.  Pain with posterior calf squeeze right greater than left.  Right calf circumference slightly increased from previous 4.5 cm, left calf circumference 38 cm.  Neurological: Epicritic sensation is intact.  Protective sensation decreased.  Vibratory sensation decreased.   Musculoskeletal Exam: Painful flatfoot deformity present bilaterally, unchanged from previous.  Muscle strength 5/5 all major muscle groups.   Assessment/Plan of Care: 1. Chronic venous insufficiency of lower extremity   2. Cellulitis and abscess of foot, except toes      No orders of the defined types were placed in this encounter.  US  VENOUS IMG LOWER BILAT (DVT)  Discussed clinical findings with patient today.  Most concerned about possible DVT involving right lower extremity versus cellulitis.  Given patient's  history of blood clots, prior PE, clinical exam findings I am instructing him to go to the med center in [place] immediately and ordering venous duplex ultrasound bilaterally. Can also start with infectious work up recommend CBC, CRP, sed rate, D-dimer.  Some concern about the bruising left dorsal foot as well secondary to anticoagulant use on coumadin .  He does have scheduled follow-up with me early September, we will have him keep this appointment.   Carlis Blanchard L. Lamount MAUL, AACFAS [facility]     [address] [place], [place] [address]                Office [phone]  Fax [phone]

## 2024-04-13 ENCOUNTER — Telehealth: Payer: Self-pay | Admitting: Podiatry

## 2024-04-13 NOTE — Telephone Encounter (Signed)
 Patient called in to inform doctor he was in the hospital and Cellulitis is his diagnosis.

## 2024-05-04 ENCOUNTER — Encounter: Payer: Self-pay | Admitting: Podiatry

## 2024-05-04 ENCOUNTER — Ambulatory Visit: Admitting: Podiatry

## 2024-05-04 VITALS — BP 135/80 | HR 78

## 2024-05-04 DIAGNOSIS — L03115 Cellulitis of right lower limb: Secondary | ICD-10-CM

## 2024-05-04 DIAGNOSIS — M2031 Hallux varus (acquired), right foot: Secondary | ICD-10-CM

## 2024-05-04 DIAGNOSIS — L03116 Cellulitis of left lower limb: Secondary | ICD-10-CM

## 2024-05-04 DIAGNOSIS — R600 Localized edema: Secondary | ICD-10-CM

## 2024-05-04 DIAGNOSIS — L97512 Non-pressure chronic ulcer of other part of right foot with fat layer exposed: Secondary | ICD-10-CM | POA: Diagnosis not present

## 2024-05-04 MED ORDER — CEPHALEXIN 500 MG PO CAPS: 500.0000 mg | ORAL_CAPSULE | Freq: Three times a day (TID) | ORAL | 0 refills | Status: AC

## 2024-05-04 MED ORDER — MUPIROCIN 2 % EX OINT: 1.0000 | TOPICAL_OINTMENT | Freq: Two times a day (BID) | CUTANEOUS | 2 refills | Status: AC

## 2024-05-04 NOTE — Progress Notes (Unsigned)
 Chief Complaint  Patient presents with   Cellulitis and abscess    Right foot, first toe, calloused over. Both legs are really really swollen. He asked if you could prescribe the same ABX he had in the hospital?   [place]in    HPI: 83 y.o. male presents today as ED follow-up for bilateral leg edema with concern for DVT vs cellulitis.  Did have ultrasound in the ED which was negative for DVT.  He did get a short course of Keflex  which seemed to help redness and swelling of the leg somewhat.  Redness has since worsened since stopping the antibiotics.  He has not been applying any compression at this point lower extremities.  Also has renewed concern for the preulcerative callus right first toe wounds had prior ulceration.  Past Medical History:  Diagnosis Date   Benign prostatic hyperplasia (BPH) with urinary urgency    Bile duct stone    Cervical disc disease    CHF (congestive heart failure) (HCC)    Chronic kidney disease (CKD), stage III (moderate) (HCC)    pt unaware   Coronary artery disease    pt unaware   Diabetes mellitus without complication (HCC)    Diabetes, polyneuropathy (HCC)    Diastolic dysfunction    ED (erectile dysfunction)    Edema    Elevated hemidiaphragm    Elevated liver function tests    Gait abnormality    Gallstones    GERD (gastroesophageal reflux disease)    Gout    History of anticoagulant therapy    History of rib fracture    Hyperlipidemia    Hypertension    Lung mass    Major depression    Malaise and fatigue    Mouth pain    OA (osteoarthritis)    Pneumonia    history of   Prostate disease    Pulmonary emboli (HCC)    Sleep apnea    does not use CPAP, resolved   Stool incontinence    Testosterone deficiency    Vitamin B 12 deficiency    Weight loss     Past Surgical History:  Procedure Laterality Date   BILIARY DILATION  01/22/2019   Procedure: BILIARY DILATION;  Surgeon: Rosalie Kitchens, MD;  Location: [facility];  Service:  Endoscopy;;   BILIARY STENT PLACEMENT N/A 01/01/2019   Procedure: BILIARY STENT PLACEMENT;  Surgeon: Burnette Fallow, MD;  [facility];  Service: Endoscopy;  Laterality: N/A;   BILIARY STENT PLACEMENT N/A 01/22/2019   Procedure: BILIARY STENT PLACEMENT;  Surgeon: Rosalie Kitchens, MD;  [facility];  Service: Endoscopy;  Laterality: N/A;   CARDIAC CATHETERIZATION  2014   ERCP N/A 01/01/2019   Procedure: ENDOSCOPIC RETROGRADE CHOLANGIOPANCREATOGRAPHY (ERCP);  Surgeon: Burnette Fallow, MD;  Location: [facility];  Service: Endoscopy;  Laterality: N/A;   ERCP N/A 01/22/2019   Procedure: ENDOSCOPIC RETROGRADE CHOLANGIOPANCREATOGRAPHY (ERCP);  Surgeon: Rosalie Kitchens, MD;  Location: [facility];  Service: Endoscopy;  Laterality: N/A;  with spyglass and lithrotipsy   ERCP N/A 02/18/2019   Procedure: ENDOSCOPIC RETROGRADE CHOLANGIOPANCREATOGRAPHY (ERCP);  Surgeon: Rosalie Kitchens, MD;  Location: [facility];  Service: Endoscopy;  Laterality: N/A;  WITH SPYGLASS    LAPAROSCOPIC CHOLECYSTECTOMY SINGLE SITE WITH INTRAOPERATIVE CHOLANGIOGRAM N/A 12/31/2018   Procedure: LAPAROSCOPIC CHOLECYSTECTOMY SINGLE SITE WITH INTRAOPERATIVE CHOLANGIOGRAM, REPAIR OF UMBILICAL HERNIA;  Surgeon: Sheldon Standing, MD;  [facility]: [facility];  Service: General;  Laterality: N/A;   REMOVAL OF STONES  01/01/2019   Procedure:  REMOVAL OF STONES;  Surgeon: Burnette Fallow, MD;  [facility];  Service: Endoscopy;;   REMOVAL OF STONES  01/22/2019   Procedure: REMOVAL OF STONES;  Surgeon: Rosalie Kitchens, MD;  [facility];  Service: Endoscopy;;   REMOVAL OF STONES  02/18/2019   Procedure: REMOVAL OF STONES;  Surgeon: Rosalie Kitchens, MD;  [facility];  Service: Endoscopy;;   right hip replacement     SPHINCTEROTOMY  01/01/2019   Procedure: ANNETT;  Surgeon: Burnette Fallow, MD;  [facility];  Service: Endoscopy;;   SPHINCTEROTOMY  01/22/2019   Procedure: ANNETT;  Surgeon: Rosalie Kitchens, MD;   [facility];  Service: Endoscopy;;   SPHINCTEROTOMY  02/18/2019   Procedure: ANNETT;  Surgeon: Rosalie Kitchens, MD;  [facility];  Service: Endoscopy;;   SPYGLASS CHOLANGIOSCOPY N/A 01/22/2019   Procedure: DEBHOJDD CHOLANGIOSCOPY;  Surgeon: Rosalie Kitchens, MD;  [facility];  Service: Endoscopy;  Laterality: N/A;   SPYGLASS LITHOTRIPSY N/A 01/22/2019   Procedure: DEBHOJDD LITHOTRIPSY;  Surgeon: Rosalie Kitchens, MD;  [facility];  Service: Endoscopy;  Laterality: N/A;   STENT REMOVAL  01/22/2019   Procedure: STENT REMOVAL;  Surgeon: Rosalie Kitchens, MD;  [facility];  Service: Endoscopy;;   STENT REMOVAL  02/18/2019   Procedure: STENT REMOVAL;  Surgeon: Rosalie Kitchens, MD;  [facility];  Service: Endoscopy;;    Allergies  Allergen Reactions   Codeine Hives   Statins Nausea Only    Cramps (ALLERGY/intolerance)   Allopurinol Rash    Unknown   Doxycycline Rash    Unknown    ROS    Physical Exam: Vitals:   05/04/24 1315  BP: 135/80  Pulse: 78  SpO2: 97%    General: The patient is alert and oriented x3 in no acute distress.  Dermatology: Erythema present to the lower legs bilaterally.  Intact blisters noted to the lower legs.  There is hemorrhagic callus present distal right first toe with underlying ulceration with significant hyperkeratotic borders and scant purulent drainage upon debridement.  This measures 0.6 x 0.6 x 0.3 cm with fat layer exposed.  Mild erythema present to the right first toe.  Wound bed does appear bleeding viable.  Vascular: +3 pitting edema present bilaterally.  Today calf and forefoot measurements were obtained due to the edema.  Left calf 39.5 cm, left forefoot 28 cm.  Right calf 41.5 cm, right forefoot 28 cm.  Right slightly decreased from previous the left is more significant.  Pulses difficult to palpate due to edema.  Feet are warm and well-perfused.  Neurological: Protective sensation  decreased.  Musculoskeletal Exam: Pes planus foot type noted bilaterally.  Right hallux malleus present.  Semireducible.  Assessment/Plan of Care: 1. Cellulitis of right lower extremity   2. Cellulitis of leg, left   3. Hallux malleus of right foot   4. Bilateral leg edema   5. 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    Dispense:  30 g    Refill:  2   cephALEXin  (KEFLEX ) 500 MG capsule    Sig: Take 1 capsule (500 mg total) by mouth 3 (three) times daily for 14 days.    Dispense:  42 capsule    Refill:  0   None  Discussed clinical findings with patient today.  Medically necessary excisional debridement of the right first toe hemorrhagic callus with underlying ulceration was performed today of nonviable skin and subcutaneous tissue using #15 blade.  Underlying ulceration measuring 0.6 x 0.6 x 0.3 cm.  Hemostasis achieved with compression.  No anesthesia required secondary to neuropathy.  100% area of nonviable hyperkeratotic tissue and scab was removed.  Antibiotic bandage applied.  Reviewed daily dressing changes with patient  Reviewed offloading with patient.  Will attempt to offload the hallux malleus contracture with crest pad and gel toe cap.  Toe is only semireducible but will consider tenotomy going forward vs surgical intervention.  Sending course of oral Keflex  3 times daily to patient's pharmacy. -The lower extremity cellulitis diagnosis represents a distinct and separate diagnosis that requires evaluation and treatment separate from other procedures or diagnosis   Does have upcoming appointment with [facility] to be fitted for diabetic shoes and for compression wraps.  If he has difficulty applying compression wraps or stockings, may need to consider lymphedema pumps.  Reevaluate in 2 weeks.   Ellan Tess L.  Lamount MAUL, AACFAS [facility]     [address] [place], [place] [address]                Office [phone]  Fax [phone]

## 2024-05-18 ENCOUNTER — Ambulatory Visit (INDEPENDENT_AMBULATORY_CARE_PROVIDER_SITE_OTHER)

## 2024-05-18 ENCOUNTER — Ambulatory Visit (INDEPENDENT_AMBULATORY_CARE_PROVIDER_SITE_OTHER): Admitting: Podiatry

## 2024-05-18 DIAGNOSIS — I872 Venous insufficiency (chronic) (peripheral): Secondary | ICD-10-CM

## 2024-05-18 DIAGNOSIS — M2031 Hallux varus (acquired), right foot: Secondary | ICD-10-CM | POA: Diagnosis not present

## 2024-05-18 DIAGNOSIS — L97511 Non-pressure chronic ulcer of other part of right foot limited to breakdown of skin: Secondary | ICD-10-CM

## 2024-05-18 DIAGNOSIS — L97512 Non-pressure chronic ulcer of other part of right foot with fat layer exposed: Secondary | ICD-10-CM | POA: Diagnosis not present

## 2024-05-18 NOTE — Progress Notes (Unsigned)
 Chief Complaint  Patient presents with   Wound Check    R great toe painful to touch. Dark in center  Soreness through mid foot also red and swollen.  A1c 6.6. warfarin    HPI: 83 y.o. male following up for area of ulceration right first toe associated with hallux malleus deformity.  Also had been dealing with cellulitis affecting the legs.  He completed course of Keflex  and did well with this.  Does have chronic swelling to the lower extremities.  The redness has gone down some.  Past Medical History:  Diagnosis Date   Benign prostatic hyperplasia (BPH) with urinary urgency    Bile duct stone    Cervical disc disease    CHF (congestive heart failure) (HCC)    Chronic kidney disease (CKD), stage III (moderate) (HCC)    pt unaware   Coronary artery disease    pt unaware   Diabetes mellitus without complication (HCC)    Diabetes, polyneuropathy (HCC)    Diastolic dysfunction    ED (erectile dysfunction)    Edema    Elevated hemidiaphragm    Elevated liver function tests    Gait abnormality    Gallstones    GERD (gastroesophageal reflux disease)    Gout    History of anticoagulant therapy    History of rib fracture    Hyperlipidemia    Hypertension    Lung mass    Major depression    Malaise and fatigue    Mouth pain    OA (osteoarthritis)    Pneumonia    history of   Prostate disease    Pulmonary emboli (HCC)    Sleep apnea    does not use CPAP, resolved   Stool incontinence    Testosterone deficiency    Vitamin B 12 deficiency    Weight loss     Past Surgical History:  Procedure Laterality Date   BILIARY DILATION  01/22/2019   Procedure: BILIARY DILATION;  Surgeon: Rosalie Kitchens, MD;  [facility];  Service: Endoscopy;;   BILIARY STENT PLACEMENT N/A 01/01/2019   Procedure: BILIARY STENT PLACEMENT;  Surgeon: Burnette Fallow, MD;  [facility];  Service: Endoscopy;  Laterality: N/A;   BILIARY STENT PLACEMENT N/A 01/22/2019   Procedure:  BILIARY STENT PLACEMENT;  Surgeon: Rosalie Kitchens, MD;  [facility];  Service: Endoscopy;  Laterality: N/A;   CARDIAC CATHETERIZATION  2014   ERCP N/A 01/01/2019   Procedure: ENDOSCOPIC RETROGRADE CHOLANGIOPANCREATOGRAPHY (ERCP);  Surgeon: Burnette Fallow, MD;  Location: [facility];  Service: Endoscopy;  Laterality: N/A;   ERCP N/A 01/22/2019   Procedure: ENDOSCOPIC RETROGRADE CHOLANGIOPANCREATOGRAPHY (ERCP);  Surgeon: Rosalie Kitchens, MD;  Location: [facility];  Service: Endoscopy;  Laterality: N/A;  with spyglass and lithrotipsy   ERCP N/A 02/18/2019   Procedure: ENDOSCOPIC RETROGRADE CHOLANGIOPANCREATOGRAPHY (ERCP);  Surgeon: Rosalie Kitchens, MD;  Location: [facility];  Service: Endoscopy;  Laterality: N/A;  WITH SPYGLASS    LAPAROSCOPIC CHOLECYSTECTOMY SINGLE SITE WITH INTRAOPERATIVE CHOLANGIOGRAM N/A 12/31/2018   Procedure: LAPAROSCOPIC CHOLECYSTECTOMY SINGLE SITE WITH INTRAOPERATIVE CHOLANGIOGRAM, REPAIR OF UMBILICAL HERNIA;  Surgeon: Sheldon Standing, MD;  [facility];  Service: General;  Laterality: N/A;   REMOVAL OF STONES  01/01/2019   Procedure: REMOVAL OF STONES;  Surgeon: Burnette Fallow, MD;  [facility];  Service: Endoscopy;;   REMOVAL OF STONES  01/22/2019   Procedure: REMOVAL OF STONES;  Surgeon: Rosalie Kitchens, MD;  [facility];  Service: Endoscopy;;  REMOVAL OF STONES  02/18/2019   Procedure: REMOVAL OF STONES;  Surgeon: Rosalie Kitchens, MD;  [facility];  Service: Endoscopy;;   right hip replacement     SPHINCTEROTOMY  01/01/2019   Procedure: SPHINCTEROTOMY;  Surgeon: Burnette Fallow, MD;  [facility];  Service: Endoscopy;;   SPHINCTEROTOMY  01/22/2019   Procedure: ANNETT;  Surgeon: Rosalie Kitchens, MD;  [facility];  Service: Endoscopy;;   SPHINCTEROTOMY  02/18/2019   Procedure: ANNETT;  Surgeon: Rosalie Kitchens, MD;  [facility];  Service: Endoscopy;;   SPYGLASS CHOLANGIOSCOPY N/A 01/22/2019   Procedure:  DEBHOJDD CHOLANGIOSCOPY;  Surgeon: Rosalie Kitchens, MD;  [facility];  Service: Endoscopy;  Laterality: N/A;   SPYGLASS LITHOTRIPSY N/A 01/22/2019   Procedure: DEBHOJDD LITHOTRIPSY;  Surgeon: Rosalie Kitchens, MD;  [facility];  Service: Endoscopy;  Laterality: N/A;   STENT REMOVAL  01/22/2019   Procedure: STENT REMOVAL;  Surgeon: Rosalie Kitchens, MD;  [facility];  Service: Endoscopy;;   STENT REMOVAL  02/18/2019   Procedure: STENT REMOVAL;  Surgeon: Rosalie Kitchens, MD;  [facility];  Service: Endoscopy;;    Allergies  Allergen Reactions   Codeine Hives   Statins Nausea Only    Cramps (ALLERGY/intolerance)   Allopurinol Rash    Unknown   Doxycycline Rash    Unknown    ROS    Physical Exam: There were no vitals filed for this visit.   General: The patient is alert and oriented x3 in no acute distress.  Dermatology:  Erythema resolved to the lower legs bilaterally.  There is ongoing significant swelling.  There is some intact bulla formation to the lower legs bilaterally.  Right first toe ulceration site there is some hemorrhagic callus formation.  Upon paring this down it does appear limited to superficial breakdown of skin today.  Does not appear acutely infected.  Vascular: +2-+3 pitting edema present bilaterally.  Feet are warm and well-perfused.  Pulses difficult to palpate due to extensive edema.  Cap refill intact to the digits.  Decreased pedal hair growth.  Some chronic skin changes to the lower legs from chronic venous insufficiency and lymphedema however no significant erythema or rubor present.  No warmth increased.  Neurological: Protective sensation decreased.  Musculoskeletal Exam: Pes planus foot type noted bilaterally.  Right hallux malleus present.  Semi rigid  Radiographs: 05/18/2024 right foot 3 views weightbearing Generalized osteopenia present.  Hallux malleus deformity present interphalangeal joint does appear arthritic pes planus  foot type noted.  Generalized arthritic findings noted.  Some vascular calcifications present.  Hammertoe contractures of the lesser digits as well.  Assessment/Plan of Care: 1. Hallux malleus, right   2. Ulcer of great toe, right, limited to breakdown of skin (HCC)   3. Chronic venous insufficiency of lower extremity      No orders of the defined types were placed in this encounter.  None  Discussed clinical findings with patient today.  Radiographs reviewed with the patient.  Today did debride the hemorrhagic callus at the site of right first toe ulceration.  Does appear limited to partial-thickness skin breakdown today.  Will continue to attempt to pad and offload the lesion with felt padding.  We did discuss surgical intervention today as this area has been ongoing and recurrent and difficult to stay healed.  If he were to proceed would attempt OR [facility] flexor tenotomy and proceed with either first toe interphalangeal joint arthrodesis or arthroplasty if unable to adequately reduce the deformity.  He would need cardiac and medical clearance for this due to his medical history.  Risk factors of CHF, CKD, diabetes, history of PE and DVT.  He is on Coumadin .  He is getting diabetic shoes soon.  We will see if this helps offload the area as well.  Did review ways to apply compression to the lower extremities as this is necessary to limit edema.  He is uncertain if he is able to do this.  No acute signs of infection today.  Follow-up in 3 to 4 weeks to check on the hallux malleus and signs for recurrent ulceration here.  Continue to apply mupirocin  if needed.  Does have recent prescription from last visit.    Jasani Lengel L. Lamount MAUL, AACFAS [facility]     [address] [place], [place] [address]                Office [phone]  Fax [phone]

## 2024-05-19 ENCOUNTER — Telehealth: Payer: Self-pay | Admitting: Lab

## 2024-05-19 NOTE — Telephone Encounter (Signed)
 Patient calling requesting refill on antibiotics would like someone to call him back and let him know if that will be done.

## 2024-05-20 DIAGNOSIS — I7121 Aneurysm of the ascending aorta, without rupture: Secondary | ICD-10-CM | POA: Insufficient documentation

## 2024-05-20 DIAGNOSIS — I739 Peripheral vascular disease, unspecified: Secondary | ICD-10-CM | POA: Insufficient documentation

## 2024-05-20 DIAGNOSIS — L97509 Non-pressure chronic ulcer of other part of unspecified foot with unspecified severity: Secondary | ICD-10-CM | POA: Insufficient documentation

## 2024-05-20 DIAGNOSIS — I5030 Unspecified diastolic (congestive) heart failure: Secondary | ICD-10-CM | POA: Insufficient documentation

## 2024-05-20 DIAGNOSIS — G3184 Mild cognitive impairment, so stated: Secondary | ICD-10-CM | POA: Insufficient documentation

## 2024-05-20 DIAGNOSIS — K029 Dental caries, unspecified: Secondary | ICD-10-CM | POA: Insufficient documentation

## 2024-05-26 ENCOUNTER — Other Ambulatory Visit: Payer: Self-pay | Admitting: Podiatry

## 2024-05-26 DIAGNOSIS — L03115 Cellulitis of right lower limb: Secondary | ICD-10-CM

## 2024-05-26 MED ORDER — CEPHALEXIN 500 MG PO CAPS: 500.0000 mg | ORAL_CAPSULE | Freq: Three times a day (TID) | ORAL | 0 refills | Status: AC

## 2024-05-26 NOTE — Progress Notes (Signed)
 ABX recurrent cellulitis sent in

## 2024-05-31 NOTE — Telephone Encounter (Signed)
 Pt is coming in on 9/30, and we will talk then and assess if he needs ABX at that time. His PCP stated it would be an issue for th FT as long as there is not a lot of bleeding . CT

## 2024-06-01 ENCOUNTER — Encounter: Payer: Self-pay | Admitting: Podiatry

## 2024-06-01 ENCOUNTER — Ambulatory Visit: Admitting: Podiatry

## 2024-06-01 DIAGNOSIS — M2031 Hallux varus (acquired), right foot: Secondary | ICD-10-CM | POA: Diagnosis not present

## 2024-06-01 DIAGNOSIS — I872 Venous insufficiency (chronic) (peripheral): Secondary | ICD-10-CM

## 2024-06-01 DIAGNOSIS — E1142 Type 2 diabetes mellitus with diabetic polyneuropathy: Secondary | ICD-10-CM

## 2024-06-01 NOTE — Progress Notes (Unsigned)
 Chief Complaint  Patient presents with   [facility]    [facility] with right hallux wound on tip. Wound is closed and calloused today. He has a brochure for lymphedema pumps.  A1c 6.9 in May [place].     HPI: 83 y.o. male following up for several issues today.  Primary issue has been the right first toe plantar ulceration.  This actually does appear quite a bit improved today.  It has been difficult for him to offload.  He is currently awaiting diabetic shoes.  He is also waiting compression stockings.  Prior to this visit he did contact office requesting antibiotics for cellulitis.  This does appear to have gone down but he is certainly dealt with this in the past.  Past Medical History:  Diagnosis Date   Benign prostatic hyperplasia (BPH) with urinary urgency    Bile duct stone    Cervical disc disease    CHF (congestive heart failure) (HCC)    Chronic kidney disease (CKD), stage III (moderate) (HCC)    pt unaware   Coronary artery disease    pt unaware   Diabetes mellitus without complication (HCC)    Diabetes, polyneuropathy (HCC)    Diastolic dysfunction    ED (erectile dysfunction)    Edema    Elevated hemidiaphragm    Elevated liver function tests    Gait abnormality    Gallstones    GERD (gastroesophageal reflux disease)    Gout    History of anticoagulant therapy    History of rib fracture    Hyperlipidemia    Hypertension    Lung mass    Major depression    Malaise and fatigue    Mouth pain    OA (osteoarthritis)    Pneumonia    history of   Prostate disease    Pulmonary emboli (HCC)    Sleep apnea    does not use CPAP, resolved   Stool incontinence    Testosterone deficiency    Vitamin B 12 deficiency    Weight loss     Past Surgical History:  Procedure Laterality Date   BILIARY DILATION  01/22/2019   Procedure: BILIARY DILATION;  Surgeon: Rosalie Kitchens, MD;  [facility];  Service: Endoscopy;;   BILIARY STENT PLACEMENT N/A 01/01/2019   Procedure:  BILIARY STENT PLACEMENT;  Surgeon: Burnette Fallow, MD;  [facility];  Service: Endoscopy;  Laterality: N/A;   BILIARY STENT PLACEMENT N/A 01/22/2019   Procedure: BILIARY STENT PLACEMENT;  Surgeon: Rosalie Kitchens, MD;  [facility];  Service: Endoscopy;  Laterality: N/A;   CARDIAC CATHETERIZATION  2014   ERCP N/A 01/01/2019   Procedure: ENDOSCOPIC RETROGRADE CHOLANGIOPANCREATOGRAPHY (ERCP);  Surgeon: Burnette Fallow, MD;  Location: [facility];  Service: Endoscopy;  Laterality: N/A;   ERCP N/A 01/22/2019   Procedure: ENDOSCOPIC RETROGRADE CHOLANGIOPANCREATOGRAPHY (ERCP);  Surgeon: Rosalie Kitchens, MD;  Location: [facility];  Service: Endoscopy;  Laterality: N/A;  with spyglass and lithrotipsy   ERCP N/A 02/18/2019   Procedure: ENDOSCOPIC RETROGRADE CHOLANGIOPANCREATOGRAPHY (ERCP);  Surgeon: Rosalie Kitchens, MD;  Location: [facility];  Service: Endoscopy;  Laterality: N/A;  WITH SPYGLASS    LAPAROSCOPIC CHOLECYSTECTOMY SINGLE SITE WITH INTRAOPERATIVE CHOLANGIOGRAM N/A 12/31/2018   Procedure: LAPAROSCOPIC CHOLECYSTECTOMY SINGLE SITE WITH INTRAOPERATIVE CHOLANGIOGRAM, REPAIR OF UMBILICAL HERNIA;  Surgeon: Sheldon Standing, MD;  [facility];  Service: General;  Laterality: N/A;   REMOVAL OF STONES  01/01/2019   Procedure: REMOVAL OF STONES;  Surgeon: Burnette Fallow, MD;  [facility];  Service: Endoscopy;;   REMOVAL OF STONES  01/22/2019   Procedure: REMOVAL OF STONES;  Surgeon: Rosalie Kitchens, MD;  [facility];  Service: Endoscopy;;   REMOVAL OF STONES  02/18/2019   Procedure: REMOVAL OF STONES;  Surgeon: Rosalie Kitchens, MD;  [facility];  Service: Endoscopy;;   right hip replacement     SPHINCTEROTOMY  01/01/2019   Procedure: SPHINCTEROTOMY;  Surgeon: Burnette Fallow, MD;  [facility];  Service: Endoscopy;;   SPHINCTEROTOMY  01/22/2019   Procedure: ANNETT;  Surgeon: Rosalie Kitchens, MD;  [facility];  Service: Endoscopy;;   SPHINCTEROTOMY   02/18/2019   Procedure: ANNETT;  Surgeon: Rosalie Kitchens, MD;  [facility];  Service: Endoscopy;;   SPYGLASS CHOLANGIOSCOPY N/A 01/22/2019   Procedure: DEBHOJDD CHOLANGIOSCOPY;  Surgeon: Rosalie Kitchens, MD;  [facility];  Service: Endoscopy;  Laterality: N/A;   SPYGLASS LITHOTRIPSY N/A 01/22/2019   Procedure: DEBHOJDD LITHOTRIPSY;  Surgeon: Rosalie Kitchens, MD;  [facility];  Service: Endoscopy;  Laterality: N/A;   STENT REMOVAL  01/22/2019   Procedure: STENT REMOVAL;  Surgeon: Rosalie Kitchens, MD;  [facility];  Service: Endoscopy;;   STENT REMOVAL  02/18/2019   Procedure: STENT REMOVAL;  Surgeon: Rosalie Kitchens, MD;  [facility];  Service: Endoscopy;;    Allergies  Allergen Reactions   Codeine Hives   Statins Nausea Only    Cramps (ALLERGY/intolerance)   Allopurinol Rash    Unknown   Doxycycline Rash    Unknown    ROS    Physical Exam: There were no vitals filed for this visit.   General: The patient is alert and oriented x3 in no acute distress.  Dermatology:  Erythema improved resolved to the lower legs bilaterally.  There is ongoing significant swelling.  There is some intact bulla formation to the lower legs bilaterally.  Today the right first toe ulceration site does appear healed.  There is some overlying callus with good underlying skin formation.  Vascular: +2-+3 pitting edema present bilaterally.  Feet are warm and well-perfused.  Pulses difficult to palpate due to extensive edema.  Cap refill intact to the digits.  Decreased pedal hair growth.  Some chronic skin changes to the lower legs from chronic venous insufficiency and lymphedema.  Neurological: Protective sensation decreased.  Musculoskeletal Exam: Pes planus foot type noted bilaterally.  Right hallux malleus present.  Semi rigid  Radiographs: 05/18/2024 right foot 3 views weightbearing Generalized osteopenia present.  Hallux malleus deformity present  interphalangeal joint does appear arthritic pes planus foot type noted.  Generalized arthritic findings noted.  Some vascular calcifications present.  Hammertoe contractures of the lesser digits as well.  Assessment/Plan of Care: 1. Hallux malleus, right   2. Chronic venous insufficiency of lower extremity   3. Diabetic polyneuropathy associated with type 2 diabetes mellitus (HCC)      No orders of the defined types were placed in this encounter.  None  Discussed clinical findings with patient today.  # Right hallux malleus # Diabetes with neuropathy - At this point we will see if we can prevent recurrence with the diabetic shoes once obtained - Continue to bandage and offload in the interim.  Continue use mupirocin  or Silvadene  cream - Have discussed procedures to correct this, limited by patient's comorbidities - Would give some consideration to an office flexor tenotomy however would likely need to come off of his Coumadin  and toe not fully reducible - Would need cardiac workup before  more invasive surgical correction could be considered.  He does have significant comorbidities -Significant comorbidities include diabetes with neuropathy, PE and DVT, CAD, venous insufficiency, vascular disease - See Dr. Wyvonnia note 9/18  # Chronic venous insufficiency - Cellulitis appears to be improving.  Complete course of Keflex  - Monitor for signs of recurrence - Reviewed elevation of extremities with patient - He is awaiting compression stockings - May need to measure legs going forward for possible lymphedema pumps if he is unable to apply the compression stockings.   Follow-up in 4 weeks   Sendy Pluta L. Lamount MAUL, AACFAS [facility]     [address] [place], [place] [address]                Office [phone]  Fax [phone]

## 2024-06-15 ENCOUNTER — Ambulatory Visit: Admitting: Podiatry

## 2024-06-21 ENCOUNTER — Ambulatory Visit: Admitting: Podiatry

## 2024-06-21 ENCOUNTER — Encounter: Payer: Self-pay | Admitting: Podiatry

## 2024-06-21 DIAGNOSIS — I872 Venous insufficiency (chronic) (peripheral): Secondary | ICD-10-CM

## 2024-06-21 DIAGNOSIS — M2031 Hallux varus (acquired), right foot: Secondary | ICD-10-CM | POA: Diagnosis not present

## 2024-06-21 DIAGNOSIS — L97512 Non-pressure chronic ulcer of other part of right foot with fat layer exposed: Secondary | ICD-10-CM | POA: Diagnosis not present

## 2024-06-21 NOTE — Progress Notes (Unsigned)
 Chief Complaint  Patient presents with   Hallux Malleus    Right hallux. Did get his compression pumps, but is confused about the wraps, and is frustrated.  Callous build up on the tip of the right hallux.     HPI: 83 y.o. male presenting today following up for right foot hallux malleus.  Has significant build up at the site of prior ulceration right first toe distal aspect.  He has recently obtained his diabetic shoes since last visit.  He also has obtained lymphedema wraps, he is unsure how to put them on.  Past Medical History:  Diagnosis Date   Benign prostatic hyperplasia (BPH) with urinary urgency    Bile duct stone    Cervical disc disease    CHF (congestive heart failure) (HCC)    Chronic kidney disease (CKD), stage III (moderate) (HCC)    pt unaware   Coronary artery disease    pt unaware   Diabetes mellitus without complication (HCC)    Diabetes, polyneuropathy (HCC)    Diastolic dysfunction    ED (erectile dysfunction)    Edema    Elevated hemidiaphragm    Elevated liver function tests    Gait abnormality    Gallstones    GERD (gastroesophageal reflux disease)    Gout    History of anticoagulant therapy    History of rib fracture    Hyperlipidemia    Hypertension    Lung mass    Major depression    Malaise and fatigue    Mouth pain    OA (osteoarthritis)    Pneumonia    history of   Prostate disease    Pulmonary emboli (HCC)    Sleep apnea    does not use CPAP, resolved   Stool incontinence    Testosterone deficiency    Vitamin B 12 deficiency    Weight loss    Past Surgical History:  Procedure Laterality Date   BILIARY DILATION  01/22/2019   Procedure: BILIARY DILATION;  Surgeon: Rosalie Kitchens, MD;  Location: [facility];  Service: Endoscopy;;   BILIARY STENT PLACEMENT N/A 01/01/2019   Procedure: BILIARY STENT PLACEMENT;  Surgeon: Burnette Fallow, MD;  Location: [facility];  Service: Endoscopy;  Laterality: N/A;   BILIARY STENT PLACEMENT N/A  01/22/2019   Procedure: BILIARY STENT PLACEMENT;  Surgeon: Rosalie Kitchens, MD;  Location: [facility];  Service: Endoscopy;  Laterality: N/A;   CARDIAC CATHETERIZATION  2014   ERCP N/A 01/01/2019   Procedure: ENDOSCOPIC RETROGRADE CHOLANGIOPANCREATOGRAPHY (ERCP);  Surgeon: Burnette Fallow, MD;  Location: [facility];  Service: Endoscopy;  Laterality: N/A;   ERCP N/A 01/22/2019   Procedure: ENDOSCOPIC RETROGRADE CHOLANGIOPANCREATOGRAPHY (ERCP);  Surgeon: Rosalie Kitchens, MD;  Location: [facility];  Service: Endoscopy;  Laterality: N/A;  with spyglass and lithrotipsy   ERCP N/A 02/18/2019   Procedure: ENDOSCOPIC RETROGRADE CHOLANGIOPANCREATOGRAPHY (ERCP);  Surgeon: Rosalie Kitchens, MD;  Location: [facility];  Service: Endoscopy;  Laterality: N/A;  WITH SPYGLASS    LAPAROSCOPIC CHOLECYSTECTOMY SINGLE SITE WITH INTRAOPERATIVE CHOLANGIOGRAM N/A 12/31/2018   Procedure: LAPAROSCOPIC CHOLECYSTECTOMY SINGLE SITE WITH INTRAOPERATIVE CHOLANGIOGRAM, REPAIR OF UMBILICAL HERNIA;  Surgeon: Sheldon Standing, MD;  Location: [facility];  Service: General;  Laterality: N/A;   REMOVAL OF STONES  01/01/2019   Procedure: REMOVAL OF STONES;  Surgeon: Burnette Fallow, MD;  Location: [facility];  Service: Endoscopy;;   REMOVAL OF STONES  01/22/2019   Procedure: REMOVAL OF STONES;  Surgeon: Rosalie Kitchens, MD;  Location: [facility];  Service: Endoscopy;;  REMOVAL OF STONES  02/18/2019   Procedure: REMOVAL OF STONES;  Surgeon: Rosalie Kitchens, MD;  Location: [facility];  Service: Endoscopy;;   right hip replacement     SPHINCTEROTOMY  01/01/2019   Procedure: SPHINCTEROTOMY;  Surgeon: Burnette Fallow, MD;  Location: [facility];  Service: Endoscopy;;   SPHINCTEROTOMY  01/22/2019   Procedure: ANNETT;  Surgeon: Rosalie Kitchens, MD;  Location: [facility];  Service: Endoscopy;;   SPHINCTEROTOMY  02/18/2019   Procedure: ANNETT;  Surgeon: Rosalie Kitchens, MD;  [facility]: [facility];  Service: Endoscopy;;   SPYGLASS CHOLANGIOSCOPY N/A  01/22/2019   Procedure: DEBHOJDD CHOLANGIOSCOPY;  Surgeon: Rosalie Kitchens, MD;  Location: [facility];  Service: Endoscopy;  Laterality: N/A;   SPYGLASS LITHOTRIPSY N/A 01/22/2019   Procedure: DEBHOJDD LITHOTRIPSY;  Surgeon: Rosalie Kitchens, MD;  Location: [facility];  Service: Endoscopy;  Laterality: N/A;   STENT REMOVAL  01/22/2019   Procedure: STENT REMOVAL;  Surgeon: Rosalie Kitchens, MD;  [facility]: [facility];  Service: Endoscopy;;   STENT REMOVAL  02/18/2019   Procedure: STENT REMOVAL;  Surgeon: Rosalie Kitchens, MD;  [facility]: [facility];  Service: Endoscopy;;   Allergies  Allergen Reactions   Codeine Hives   Statins Nausea Only    Cramps (ALLERGY/intolerance)   Allopurinol Rash    Unknown   Doxycycline Rash    Unknown      PHYSICAL EXAM: There were no vitals filed for this visit.  General: The patient is alert and oriented x3 in no acute distress.  Dermatology: Atrophic pedal skin.  Interspaces clear of maceration and debris.    Wound 1:  Location: Right first toe distal callus.        Depth: Subcutaneous tissue following debridement        Wound Border: Hyperkeratotic        Wound Base: Granulation tissue        Drainage: None       Odor?:  No        Surrounding Tissue: Hyperkeratosis        Infected?:  No        Necrosis?:  Full-thickness loss of skin        Pain?:  No        Tunneling: No       Dimensions (cm): Predebridement measurements limited by overlying callus.  Postdebridement measurement 0.5 x 0.3 x 0.3 cm with fat layer exposed and significant callus to periwound.  Vascular: Pedal pulses are difficult to palpate due to significant edema.  Cap refill intact to the digits.  +2 to +3 pitting edema present bilaterally.  Decreased pedal hair growth.  Chronic skin changes from chronic venous insufficiency and lymphedema.  Neurological: Protective sensation decreased.  Musculoskeletal Exam: Pes planus foot type noted bilaterally.  Semireducible hammertoe contractures  bilaterally.  There is right foot hallux malleus present with the interphalangeal joint plantarflexed.  It is semirigid, does seem to be partially reducible      No data to display           ASSESSMENT / PLAN OF CARE: 1. Hallux malleus, right   2. 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 Chronic venous insufficiency of lower extremity      No orders of the defined types were placed in this encounter.  None  # Recurrence of right first toe distal tuft ulceration secondary to hallux malleus  The ulceration was sharply debrided of hyperkeratotic and devitalized soft tissue with sterile #15 blade  microcurette to the level of subcutaneous tissue.  Postdebridement measurement 0.5 x 0.3 x 0.3 smears.  Hemostasis obtained.  Antibiotic ointment and DSD applied.  Reviewed off-loading with patient.  Attempt to offload with gel toe cap.  Does have new diabetic shoes we will see if this helps with the ulceration.  Reviewed daily dressing changes with patient.  Discussed risks / concerns regarding ulcer with patient and possible sequelae if left untreated.  Stressed importance of infection prevention at home. Short-term goals are: prevent infection, off-load ulcer, heal ulcer Long-term goals are:  prevent recurrence, prevent amputation.   -Regarding the hallux malleus deformity we have discussed various procedures to try and offload this and prevent recurrence. - Recommending flexor tenotomy of the FHL at the interphalangeal joint level of the first toe if we cannot get the wound healed or we get recurrence. - Would carry some increased risk of wound healing due to diabetes, lymphedema, CHF, anticoagulated on Coumadin . - Patient wanting to defer this for now, he is uncertain about being in a surgical shoe for any amount of time due to gait instability, is also unclear if he has anyone to assist with driving and transportation.  # Bilateral venous  insufficiency/lymphedema - Patient recently obtained new lymphedema wraps - He is unsure how to put them on - Significant time spent today demonstrating how to properly apply these to the lower extremities, do think these will be beneficial for him limiting the extent of his swelling. -I certify that this diagnosis represents a distinct and separate diagnosis that requires evaluation and treatment separate from other procedures or diagnosis   Return in about 3 weeks (around 07/12/2024) for Wound Care.   Irvine Glorioso L. Lamount MAUL, AACFAS [facility]     [address] [place], [place] [address]                Office [phone]  Fax [phone]

## 2024-06-21 NOTE — Patient Instructions (Addendum)
 Instructions for Wound Care  The most important step to healing a foot wound is to reduce the pressure on your foot - it is extremely important to stay off your foot as much as possible and wear the shoe/boot as instructed.  Cleanse your foot with saline wash or warm soapy water  (dial antibacterial soap or similar).  Blot dry.  Apply prescribed mupirocin  or silvadene  to your wound and cover with gauze and a bandage.  May hold bandage in place with Coban (self sticky wrap), Ace bandage or tape.  You may find dressing supplies at your local [employer], [employer], drug store or medical supply store.  Extra mupirocin  is being sent to your pharmacy if you need it. If you have sufficient medication at home you do not need to fill the prescription.  I also recommend trying to use a felt horseshoe or callus pad to protect the end of the right first toe, you can find these on [employer].   Monitor for any signs/symptoms of infection. If there is any increase in redness, red streaks, increase in drainage, warmth to your foot please give us  a call. Also, if you start to run a fever or have flu-like symptoms that can also be a sign of infection. Call the office immediately if any occur or go directly to the emergency room.   If you have any questions, please feel free to give us  a call at [phone] or if you are on MyChart you can always send me a message if needed.     Compression stockings: Try and apply your lymphedema wraps first thing in the morning when getting out of bed.  This will make it easier for you to put them on before your legs swell up.  Remember to apply the bottom first working your way up, alternating left and right straps and using the horizontal Velcro to anchor onto the strap below it.  You may need to buy pantyhose or something similar to have a stretchy sock that you can apply underneath.

## 2024-06-22 ENCOUNTER — Encounter: Payer: Self-pay | Admitting: *Deleted

## 2024-06-22 ENCOUNTER — Ambulatory Visit: Attending: Cardiology | Admitting: Cardiology

## 2024-07-12 ENCOUNTER — Ambulatory Visit: Admitting: Podiatry

## 2024-07-12 DIAGNOSIS — L97512 Non-pressure chronic ulcer of other part of right foot with fat layer exposed: Secondary | ICD-10-CM

## 2024-07-12 DIAGNOSIS — E1142 Type 2 diabetes mellitus with diabetic polyneuropathy: Secondary | ICD-10-CM

## 2024-07-12 DIAGNOSIS — M2031 Hallux varus (acquired), right foot: Secondary | ICD-10-CM

## 2024-07-12 NOTE — Progress Notes (Unsigned)
 Chief Complaint  Patient presents with   Wound Check    R great toe wound is closed with dried blood underneath. Diabetic A1c 6.6    HPI: 83 y.o. male presenting today following up for right foot hallux malleus and wound care of associated ulceration. He has difficulty maintaining dressings to the area. He thinks it is somewhat improved.  Past Medical History:  Diagnosis Date   Benign prostatic hyperplasia (BPH) with urinary urgency    Bile duct stone    Cervical disc disease    CHF (congestive heart failure) (HCC)    Chronic kidney disease (CKD), stage III (moderate) (HCC)    pt unaware   Coronary artery disease    pt unaware   Diabetes mellitus without complication (HCC)    Diabetes, polyneuropathy (HCC)    Diastolic dysfunction    ED (erectile dysfunction)    Edema    Elevated hemidiaphragm    Elevated liver function tests    Gait abnormality    Gallstones    GERD (gastroesophageal reflux disease)    Gout    History of anticoagulant therapy    History of rib fracture    Hyperlipidemia    Hypertension    Lung mass    Major depression    Malaise and fatigue    Mouth pain    OA (osteoarthritis)    Pneumonia    history of   Prostate disease    Pulmonary emboli (HCC)    Sleep apnea    does not use CPAP, resolved   Stool incontinence    Testosterone deficiency    Vitamin B 12 deficiency    Weight loss    Past Surgical History:  Procedure Laterality Date   BILIARY DILATION  01/22/2019   Procedure: BILIARY DILATION;  Surgeon: Rosalie Kitchens, MD;  Location: [facility];  Service: Endoscopy;;   BILIARY STENT PLACEMENT N/A 01/01/2019   Procedure: BILIARY STENT PLACEMENT;  Surgeon: Burnette Fallow, MD;  Location: [facility];  Service: Endoscopy;  Laterality: N/A;   BILIARY STENT PLACEMENT N/A 01/22/2019   Procedure: BILIARY STENT PLACEMENT;  Surgeon: Rosalie Kitchens, MD;  Location: [facility];  Service: Endoscopy;  Laterality: N/A;   CARDIAC CATHETERIZATION  2014   ERCP  N/A 01/01/2019   Procedure: ENDOSCOPIC RETROGRADE CHOLANGIOPANCREATOGRAPHY (ERCP);  Surgeon: Burnette Fallow, MD;  Location: [facility];  Service: Endoscopy;  Laterality: N/A;   ERCP N/A 01/22/2019   Procedure: ENDOSCOPIC RETROGRADE CHOLANGIOPANCREATOGRAPHY (ERCP);  Surgeon: Rosalie Kitchens, MD;  Location: [facility];  Service: Endoscopy;  Laterality: N/A;  with spyglass and lithrotipsy   ERCP N/A 02/18/2019   Procedure: ENDOSCOPIC RETROGRADE CHOLANGIOPANCREATOGRAPHY (ERCP);  Surgeon: Rosalie Kitchens, MD;  Location: [facility];  Service: Endoscopy;  Laterality: N/A;  WITH SPYGLASS    LAPAROSCOPIC CHOLECYSTECTOMY SINGLE SITE WITH INTRAOPERATIVE CHOLANGIOGRAM N/A 12/31/2018   Procedure: LAPAROSCOPIC CHOLECYSTECTOMY SINGLE SITE WITH INTRAOPERATIVE CHOLANGIOGRAM, REPAIR OF UMBILICAL HERNIA;  Surgeon: Sheldon Standing, MD;  Location: [facility];  Service: General;  Laterality: N/A;   REMOVAL OF STONES  01/01/2019   Procedure: REMOVAL OF STONES;  Surgeon: Burnette Fallow, MD;  [facility]: [facility];  Service: Endoscopy;;   REMOVAL OF STONES  01/22/2019   Procedure: REMOVAL OF STONES;  Surgeon: Rosalie Kitchens, MD;  Location: [facility];  Service: Endoscopy;;   REMOVAL OF STONES  02/18/2019   Procedure: REMOVAL OF STONES;  Surgeon: Rosalie Kitchens, MD;  Location: [facility];  Service: Endoscopy;;   right hip replacement     SPHINCTEROTOMY  01/01/2019  Procedure: SPHINCTEROTOMY;  Surgeon: Burnette Fallow, MD;  Location: [facility];  Service: Endoscopy;;   SPHINCTEROTOMY  01/22/2019   Procedure: ANNETT;  Surgeon: Rosalie Kitchens, MD;  Location: [facility];  Service: Endoscopy;;   SPHINCTEROTOMY  02/18/2019   Procedure: ANNETT;  Surgeon: Rosalie Kitchens, MD;  [facility]: [facility];  Service: Endoscopy;;   SPYGLASS CHOLANGIOSCOPY N/A 01/22/2019   Procedure: DEBHOJDD CHOLANGIOSCOPY;  Surgeon: Rosalie Kitchens, MD;  Location: [facility];  Service: Endoscopy;  Laterality: N/A;   SPYGLASS LITHOTRIPSY N/A 01/22/2019    Procedure: DEBHOJDD LITHOTRIPSY;  Surgeon: Rosalie Kitchens, MD;  Location: [facility];  Service: Endoscopy;  Laterality: N/A;   STENT REMOVAL  01/22/2019   Procedure: STENT REMOVAL;  Surgeon: Rosalie Kitchens, MD;  [facility]: [facility];  Service: Endoscopy;;   STENT REMOVAL  02/18/2019   Procedure: STENT REMOVAL;  Surgeon: Rosalie Kitchens, MD;  Location: [facility];  Service: Endoscopy;;   Allergies  Allergen Reactions   Codeine Hives   Statins Nausea Only    Cramps (ALLERGY/intolerance)   Allopurinol Rash    Unknown   Doxycycline Rash    Unknown      PHYSICAL EXAM: There were no vitals filed for this visit.  General: The patient is alert and oriented x3 in no acute distress.  Dermatology: Atrophic pedal skin.  Interspaces clear of maceration and debris.    Wound 1:  Location: Right first toe hemorrhagic callus        Depth: Subcutaneous tissue following debridement        Wound Border: Hyperkeratotic        Wound Base: Granulation tissue        Drainage: None       Odor?:  No        Surrounding Tissue: Hyperkeratosis        Infected?:  No        Necrosis?:  Full-thickness loss of skin, subcutaneous tissue        Pain?:  No        Tunneling: No       Dimensions (cm): Predebridement measurements limited by overlying callus.  Postdebridement measurement 0.8 x 0.5 x 0.3 cm with fat layer exposed and significant callus to periwound.  Vascular: Pedal pulses are difficult to palpate due to significant edema.  Cap refill intact to the digits.  +2 to +3 pitting edema present bilaterally.  Decreased pedal hair growth.  Chronic skin changes from chronic venous insufficiency and lymphedema.  Neurological: Protective sensation decreased.  Musculoskeletal Exam: Pes planus foot type noted bilaterally.  Semireducible hammertoe contractures bilaterally.  There is right foot hallux malleus present with the interphalangeal joint plantarflexed.  It is semirigid, does seem to be partially reducible       No data to display           ASSESSMENT / PLAN OF CARE: 1. Hallux malleus, right   2. 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 Diabetic polyneuropathy associated with type 2 diabetes mellitus (HCC)       No orders of the defined types were placed in this encounter.  None  # Right first toe distal tuft ulceration with subcutaneous tissue exposed  The ulceration was sharply debrided of hyperkeratotic and devitalized soft tissue with sterile #15 blade microcurette to the level of subcutaneous tissue.  Postdebridement measurement 0.8 x 0.5 x 0.3 cm.  Hemostasis obtained.  Antibiotic ointment and DSD applied.  Reviewed off-loading with patient.  Felt horseshoe  pad applied and secured in place with Coban  Has been using diabetic shoes, ulceration has been ongoing  Reviewed daily dressing changes with patient.  Discussed risks / concerns regarding ulcer with patient and possible sequelae if left untreated.  Stressed importance of infection prevention at home. Short-term goals are: prevent infection, off-load ulcer, heal ulcer Long-term goals are:  prevent recurrence, prevent amputation.   -Regarding the hallux malleus deformity we have discussed various procedures to try and offload this and prevent recurrence. - Recommending flexor tenotomy of the FHL at the interphalangeal joint level of the first toe if we cannot get the wound healed or we get recurrence. - Would carry some increased risk of wound healing due to diabetes, lymphedema, CHF, anticoagulated on Coumadin . - Patient wanting to defer this for now, he is uncertain about being in a surgical shoe for any amount of time due to gait instability, is also unclear if he has anyone to assist with driving and transportation. - He thinks he may be more willing to consider this if we do not get improvement of the ulcer and if this can be done after the holidays.   Return in about 2 weeks (around  07/26/2024) for Wound Care.   Huston Stonehocker L. Lamount MAUL, AACFAS [facility]     [address] [place], [place] [address]                Office [phone]  Fax [phone]

## 2024-07-12 NOTE — Patient Instructions (Signed)

## 2024-07-13 ENCOUNTER — Telehealth: Payer: Self-pay | Admitting: Lab

## 2024-07-13 NOTE — Telephone Encounter (Signed)
 Patient is calling states he is in a lot of pain on his toe that you worked on states the pain is severe and comes every 10 seconds please advise.

## 2024-07-19 NOTE — Progress Notes (Signed)
 Brandon [name] is a 83 y.o.male.  Subjective:     This patient is in today to undergo an annual wellness examination.  There are also health issues and problems which need to be reviewed today.  We went through these two evaluations separately.  In addition, this patient is due timewise for an annual Medicare wellness visit.  We went through that evaluation as well today separately from the others.  He tells me overall he has done miserably.  In fact he has been poor with his feelings about his overall health for a long time.  He is not having fevers or chills.  He is not coughing significantly.  There is no chest discomfort.  There is no nausea or vomiting, however he deals with chronic significant pain and wants to know what more can be done regarding this.  He shows me a pill bottle of tramadol that he got somewhere.  He has not used it.  This patient has done poorly with opiates in the past.  We have in the past agreed to give him 1 a day, but he is no longer doing that as he could get confused and at higher risk for fall.  He has not fallen.  In general I do not recommend he use it.  He shows me his abdomen.  On the right side of his abdomen there is a circular macular patch of erythema.  It is well-delineated.  He does not have this elsewhere.  He does not remember doing anything, but he describes it as being itchy.  He does not remember being bit by anything.  The color of this erythema is uniform throughout and bright red.  He is diabetic, due for laboratory follow-up.  There is an ascending aneurysm that is followed by echo that is noncritical, due for repeat next summer.  He had questions about this and he can either continue the echo or do a CT with me.  He and I reviewed this and we will plan to followed up at his next visit with me for chronic follow-up.  His blood pressures fortunately look good.  He has had anemia of chronic disease.  He has maintained chronic use of anticoagulants as  Coumadin  for years after having pulmonary emboli and near death experience.  Urinary troubles remain present with BPH.  There is been no gout flareup.  He remains on fenofibrate .  He is frustrated by swollen legs and tells me he got some Velcro wraps but has a hard time putting them on.  He and I reviewed this together.  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  He and I reviewed issues and aspects of prevention at length.   He received a flu shot today.  He is due for the Tdap.  He will consider taking that at his pharmacy.  We discussed COVID-19 vaccines.  In his age he is not planning to do further preventative colonoscopies or preventative PSAs.  He understands principles of healthful living including the importance of a healthy diet, healthy and safe exercise, healthy lifestyle choices, stress management, sleep management, safety and hazards.  I additionally have evaluated him regarding his chronic medical problems today.  He and I reviewed them together separately from his Wellness evaluations.  Diabetes hopefully is doing well.  I will update a hemoglobin A1c.  An aneurysm appears stable due for repeat imaging next summer.  He and I discussed this in detail.  Congestive heart  failure appears compensated.  Hypertension appears stable.  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  I do not have an easy simple solution for him.  Anemia of chronic disease appears stable.  I will update blood counts.  BPH with bladder outlet issues and urgency remains on the same medications.  He appears mostly stable.  Gout has not flared up and appears stable.  Pulmonary emboli were significant in his past.  He maintains chronic use of Coumadin .  He is overdue for a PT/INR.  I ordered that today.  Chronic malaise and fatigue are multifactorial and appears stable.  Mild cognitive impairment is doing well when I interact with him today.   This is made worse by medications at times.  Hyperlipidemia is on fenofibrate .  I will update a lipid panel and a chemistry.  This appears stable.  Venous stasis is significant.  He is best to wear compressions.  He understands.  Vitamin B12 deficiency appears stable.  I will update a B12 level.  I have ordered a broad series of laboratory studies today related to his health issues and health problems.  I will make further recommendations after they are reviewed.  I am not sure what this rash is.  It looks most like a burn or some type of abrasive injury, but that said an unusual spot for that and he does not remember burning it.  It is bright like jock itch, but it is high up from the groin.  Ultimately I am giving him some Lotrisone to place on this.  Hopefully this will resolve this.  I clarified with him regarding cellulitis but it does not look like cellulitis to me, however as it turns out the patient is already on an unknown antibiotic for his ear from having had skin surgery recently, making cellulitis less likely as well.  Today I have evaluated and managed two or more of this patient's chronic stable/unstable health problems.  Today I have reviewed and managed this patient's medications related to their health problems and/or I have prescribed or renewed prescriptions as necessary for health problems.  Today I have evaluated and managed this patient with a health problem(s) considered moderate risk for morbidity and requiring further testing and/or treatment.   Additionally this patient was due timewise for a Medicare annual wellness visit.  He and I went through the safety and risk hazards questionnaire together.  I highlighted the areas for him that were higher risk.  We additionally went through his quality and health maintenance measures. I have updated those appropriate for him today.  The patient overall was appreciative.  This Medicare annual wellness visit was done separately from his  other evaluations today.  He took a flu shot today.  I recommend he take the Tdap vaccine at his pharmacy.  He is not planning to do further preventative colonoscopy or preventative PSA.  _____________________________________  TDAP flu  Rash on arms.  Diagnoses this Encounter 1. Wellness examination   2. Vitamin B12 deficiency   3. Venous stasis   4. Mixed hyperlipidemia   5. Mild cognitive impairment   6. Malaise and fatigue   7. History of pulmonary embolism   8. Idiopathic chronic gout of multiple sites without tophus   9. Benign prostatic hyperplasia with urinary frequency   10. Anticoagulant long-term use   11. Anemia, chronic disease   12. Primary osteoarthritis involving multiple joints   13. Degeneration of intervertebral disc of lumbar region  with discogenic back pain   14. Essential hypertension   15. CHF with left ventricular diastolic dysfunction, NYHA class 1    (CMD)   16. Aneurysm of ascending aorta without rupture   17. Diabetic polyneuropathy associated with type 2 diabetes mellitus (HCC)   18. Encounter for Medicare annual wellness exam    Chief Complaint  Patient presents with   wellness exam; chronic problems and med management    Patient's Medications  New Prescriptions   No medications on file  Previous Medications   ACETAMINOPHEN  (TYLENOL ) 325 MG TABLET    Give 3 tablets   DULOXETINE  (CYMBALTA ) 30 MG CAPSULE    Take 1 capsule (30 mg total) by mouth daily.   ERGOCALCIFEROL (VITAMIN D2) 1,250 MCG (50,000 UNIT) CAPSULE    TAKE 1 CAPSULE BY MOUTH 1 TIME A WEEK   FENOFIBRATE  (LOFIBRA) 160 MG TABLET    Take 1 tablet (160 mg total) by mouth daily.   FINASTERIDE  (PROSCAR ) 5 MG TABLET    Take 1 tablet (5 mg total) by mouth daily.   FOSINOPRIL (MONIPRIL) 10 MG TABLET    Take 1 tablet (10 mg total) by mouth daily.   FUROSEMIDE  (LASIX ) 40 MG TABLET    Take 1 tablet (40 mg total) by mouth daily as needed (swelling).   LIDOCAINE  (LIDODERM ) 5 % PATCH        MUPIROCIN  (BACTROBAN ) 2 % OINTMENT    Apply  topically 2 (two) times a day as needed.   OXYBUTYNIN  (DITROPAN ) 5 MG TABLET    Take 1 tablet (5 mg total) by mouth 2 (two) times a day.   PANTOPRAZOLE  (PROTONIX ) 20 MG EC TABLET    Take 1 tablet (20 mg total) by mouth every morning before breakfast.   SENNA (SENOKOT) 8.6 MG TABLET    2 tablets at bedtime   TAMSULOSIN  (FLOMAX ) 0.4 MG CAP    Take 1 capsule (0.4 mg total) by mouth daily.   TRAMADOL (ULTRAM) 50 MG TABLET    Take 50 mg by mouth every 6 (six) hours as needed for moderate pain (4-6).   WARFARIN (COUMADIN ) 5 MG TABLET    TAKE AS DIRECTED  Modified Medications   Modified Medication Previous Medication   CLOTRIMAZOLE-BETAMETHASONE (LOTRISONE) 1-0.05 % CREAM clotrimazole-betamethasone (LOTRISONE) 1-0.05 % cream      Apply topically 2 (two) times a day.    Apply sparingly to rash twice daily as needed.  Discontinued Medications   No medications on file   Orders Placed This Encounter  Procedures   Flu, High-Dose, Trivalent, PF IM (FLUZONE HIGH-DOSE)   CBC without Differential   Comprehensive Metabolic Panel   Lipid Panel   Albumin, Random Urine   Hemoglobin A1C With Estimated Average Glucose   TSH   Ferritin   Iron And Total Iron Binding Capacity   Prothrombin Time (PT) with INR   CBC without Differential   Physical Exam Constitutional:      General: He is not in acute distress.    Appearance: He is not toxic-appearing or diaphoretic.     Comments: Chronically ill, elderly.  No acute distress.  HENT:     Head: Normocephalic and atraumatic.     Mouth/Throat:     Mouth: Mucous membranes are moist.  Eyes:     General: No scleral icterus.       Right eye: No discharge.        Left eye: No discharge.  Neck:     Vascular: No carotid bruit.  Cardiovascular:     Rate and Rhythm: Normal rate and regular rhythm.     Heart sounds: No murmur heard.    No friction rub.  Pulmonary:     Effort: No respiratory distress.      Breath sounds: Decreased breath sounds present. No wheezing, rhonchi or rales.  Abdominal:     Palpations: Abdomen is soft. There is no hepatomegaly or mass.     Tenderness: There is no abdominal tenderness. There is no rebound.  Musculoskeletal:     Cervical back: Neck supple.     Right lower leg: 3+ Edema present.     Left lower leg: 3+ Edema present.  Lymphadenopathy:     Cervical: No cervical adenopathy.  Skin:    General: Skin is warm and dry.     Coloration: Skin is not pale.     Findings: No rash.      Neurological:     Mental Status: He is alert and oriented to person, place, and time.     Gait: Gait abnormal.     _____________________ Vitals:   07/19/24 1250  BP: 126/80  Pulse: 82  Resp: 15  Temp: 98.8 F (37.1 C)  Weight: 98.4 kg (217 lb)  Height: 1.88 m (6' 2)   Body mass index is 27.86 kg/m.  Problem List[1] Medical History[2] Surgical History[3] Family History[4] Social History   Socioeconomic History   Marital status: Married    Spouse name: Not on file   Number of children: Not on file   Years of education: Not on file   Highest education level: Not on file  Occupational History   Not on file  Tobacco Use   Smoking status: Former   Smokeless tobacco: Never  Substance and Sexual Activity   Alcohol use: No   Drug use: No   Sexual activity: Not on file  Other Topics Concern   Not on file  Social History Narrative   Not on file   Social Drivers of Health   Food Insecurity: Low Risk  (07/19/2024)   Food vital sign    Within the past 12 months, you worried that your food would run out before you got money to buy more: Never true    Within the past 12 months, the food you bought just didn't last and you didn't have money to get more: Never true  Transportation Needs: No Transportation Needs (07/19/2024)   Transportation    In the past 12 months, has lack of reliable transportation kept you from medical appointments,  meetings, work or from getting things needed for daily living? : No  Safety: Low Risk  (07/19/2024)   Safety    How often does anyone, including family and friends, physically hurt you?: Never    How often does anyone, including family and friends, insult or talk down to you?: Never    How often does anyone, including family and friends, threaten you with harm?: Never    How often does anyone, including family and friends, scream or curse at you?: Never  Living Situation: Low Risk  (07/19/2024)   Living Situation    What is your living situation today?: I have a steady place to live    Think about the place you live. Do you have problems with any of the following? Choose all that apply:: None/None on this list   Allergies[5]  Review of Systems  Constitutional:  Negative for chills and fever.  HENT:  Negative for congestion.   Respiratory:  Negative for cough, shortness of breath and wheezing.   Cardiovascular:  Negative for chest pain and palpitations.  Gastrointestinal:  Negative for abdominal pain, constipation, diarrhea and nausea.  Genitourinary:  Negative for difficulty urinating and dysuria.    Some components of the Review of Systems, Social History, Past Medical History, Family History, and Vital Signs were taken from the patient and documented by the medical assistant assisting the provider today.  Electronically signed by: Tobias Canda Redo, CMA 07/19/2024 12:50 PM [facility]  - [facility] Medicare Wellness Visit Type:: Subsequent Annual Wellness Visit  Name: Brandon [name]. Date of Birth: [birth year] Age: 83 y.o. 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 Does the patient have a  support system (family, friend, church, conservation officer, nature, etc)?: Yes   Do you have any dental concerns?: No In the past month, have you experienced a change in your bladder control?: No   Do you have any difficulty obtaining your medications?: No   Do you have trouble consistently taking or remembering to take all of your medications as prescribed?: No Patient rates overall stress level as: Some Does stress affect daily life?: No Typical amount of pain: none Does pain affect daily life?: No Are you currently prescribed opioids?: No                Depression Screening  Behavioral Health Screening  Patient Health Questionnaire-2 Score: 0 (07/19/2024 12:59 PM)  Patient Health Questionnaire-9 Score: 4 (07/19/2024 12:59 PM)   PHQ Question # 9 Thoughts that you would be better off dead or hurting yourself in some way: Not at all    Patient's Depression screening/score = Negative        Social History (Tobacco/Drugs/Sexual Activity) Brandon [name] reports that he has quit smoking. He has never used smokeless tobacco. Tobacco Use?: No How many times in the past year have you used a recreational drug or used a prescription medication for nonmedical reasons?: None Risk factors for sexually transmitted infections (i.e., multiple sexual partners): No Are you bothered by sexual problems?: No  Alcohol Screening How often do you have a drink containing alcohol?: Never How many standard drinks containing alcohol do you have on a typical day?: Never, 1 or 2 drinks How often do you have six or more drinks on one occasion?: Never Audit-C Score: 0  Physical Activity Regular exercise?: (!) No (only safe)      Diet How many meals a day?: 2 Eats fruit and vegetables daily?: Yes Most meals are obtained by: preparing their own meals  Home and Transportation Safety All rugs have non-skid backing?: N/A, no rugs All stairs or steps have railings?: N/A, no stairs Grab bars in the bathtub or  shower?: (!) No (Recommend) Have non-skid surface in bathtub or shower?: Yes Good home lighting?: Yes Regular seat belt use?: Yes      Activities of Daily Living Feed self?: Yes Bathe self?: Yes Dress self?: Yes Use toilet without assistance?: Yes Walk without assistance?: (!) No, needs assistance from (use assistance) Walking assistance provided by: cane  Instrumental Activities of Daily Living Manage finances?: Yes Shop for themselves?: Yes Prepare meals?: Yes Use the telephone?: Yes Manage medications?: Yes   Performs basic housework/laundry?: Yes Drives?: Yes Primary transportation is: driving  Hearing Concerns about hearing?: No Uses hearing aids?: (!) Yes (Okay) Hear  whispered voice? (Observed): Yes  Vision Concerns about vision?: No    Fall Risk Is the patient ambulatory?: Yes One or more falls in the last year:: No Feels unsteady when walking:: Yes  Cognitive Assessment Has a diagnosis of dementia or cognitive impairment?: No Are there any memory concerns by the patient, others, or providers?: No              Advance Directives Living will?: (!) No (Recommend) Advance directive information provided to patient: Yes Healthcare POA?: (!) No (Recommend)       Who is your in case of emergency contact?: Brandon [name] Relationship to patient: Spouse Emergency contact's phone number: n/a   Other History I reviewed and updated the following risk factors and conditions as appropriate: Reviewed/Updated: Problem List, Medical History, Surgical History, Family History, Medications, Allergies Reviewed/Updated: Vital Signs (height, weight, and BP), Immunizations, Health Maintenance Patient Care Team Updated: Done  Vital Signs BP 126/80   Pulse 82   Temp 98.8 F (37.1 C)   Resp 15   Ht 1.88 m (6' 2)   Wt 98.4 kg (217 lb)   BMI 27.86 kg/m   Screening and Immunizations Health Maintenance Status       Date Due Completion Dates   Diabetes:   Quantitative uACR for Kidney Evaluation Never done ---   DTaP/Tdap/Td Vaccines (1 - Tdap) Never done ---   Diabetes: Foot Exam 09/16/2024 09/17/2023, 09/17/2023   Comment on 07/09/2022: See [facility]   Diabetes: Hemoglobin A1C 01/11/2025 01/12/2024, 01/12/2024   Depression Monitoring 01/16/2025 07/19/2024   Diabetes:  eGFR for Kidney Evaluation 07/19/2025 07/19/2024, 01/12/2024   Comprehensive Annual Visit 07/19/2025 07/19/2024, 07/19/2024       Immunization History  Administered Date(s) Administered   Influenza, High-dose Seasonal, Quadrivalent, Preservative Free 05/19/2019, 07/03/2020, 06/14/2021, 07/09/2022   Influenza, Injectable, Quadrivalent, Preservative Free 06/09/2017, 06/03/2018   Influenza, high-dose, trivalent, PF 05/22/2023, 07/19/2024   Influenza, split virus, trivalent, preservative 06/01/2015   Moderna SARS-CoV-2 Primary Series 12+ yrs 08/16/2020   Pneumococcal Conjugate 13-Valent 04/15/2016   Pneumococcal Polysaccharide Vaccine, 23 Valent (PNEUMOVAX-23) 2Y+ 11/10/2013   RSV recombinant,PF(AREXVY)50Y+, not Medicare 08/15/2022   Varicella Zoster [facility]) 18Y+ 08/15/2022, 07/24/2023    Assessment/Plan: Subsequent Annual Wellness Visit: The topics above were reviewed with the patient.  Healthy lifestyle principles reviewed.  Recommendations provided when indicated.  Follow up 1 year for next wellness visit.  Orders Placed This Encounter  Procedures   Flu, High-Dose, Trivalent, PF IM (FLUZONE HIGH-DOSE)   CBC without Differential   Comprehensive Metabolic Panel   Lipid Panel   Albumin, Random Urine   Hemoglobin A1C With Estimated Average Glucose   TSH   Ferritin   Iron And Total Iron Binding Capacity   Prothrombin Time (PT) with INR   CBC without Differential    New Medications Ordered This Visit  Medications   traMADoL (ULTRAM) 50 mg tablet    Sig: Take 50 mg by mouth every 6 (six) hours as needed for moderate pain (4-6).    clotrimazole-betamethasone (LOTRISONE) 1-0.05 % cream    Sig: Apply topically 2 (two) times a day.    Dispense:  45 g    Refill:  1    Patient Care Team: Cathlyn DELENA Nash, MD as PCP - General (Internal Medicine) Darice Tressa Sprague, FNP as PCP - Attributed  Electronically signed by: Cathlyn DELENA Nash, MD 07/19/2024 5:46 PM       [1] Patient Active Problem List Diagnosis   Anticoagulant long-term use  Degenerative cervical disc   Gastroesophageal reflux disease   Gout   Mixed hyperlipidemia   Essential hypertension   Lung granuloma (HCC)   Testosterone deficiency   Vitamin B12 deficiency   Elevated hemidiaphragm   Primary osteoarthritis involving multiple joints   Arch pain, unspecified laterality   Diabetic polyneuropathy associated with type 2 diabetes mellitus (HCC)   Onychomycosis due to dermatophyte   Pre-ulcerative calluses   Bunion, right foot   Hammer toe of right foot   Callus of foot   History of pulmonary embolism   Tinea barbae   Balance disorder   Benign prostatic hyperplasia with urinary frequency   Recurrent Clostridium difficile diarrhea   Trigger middle finger of left hand   Anemia, chronic disease   Malaise and fatigue   Myalgia due to statin   Degenerative lumbar disc   Venous stasis   History of IBS   Chronic allergic rhinitis   Bug bite   Primary herpes simplex infection of lips   Seborrhea   Bilateral lower extremity edema   PVD (peripheral vascular disease)   Aneurysm of ascending aorta without rupture   CHF with left ventricular diastolic dysfunction, NYHA class 1    (CMD)   Mild cognitive impairment   Dental caries   Ulcer of toe    (CMD)  [2] Past Medical History: Diagnosis Date   Arthritis    Back pain    Diabetes mellitus type II, controlled    Hypertension    Pneumonia    Pulmonary embolism   [3] Past Surgical History: Procedure Laterality Date   OTHER SURGICAL HISTORY      Procedure: OTHER SURGICAL HISTORY (stipulator for back pain)   TOTAL HIP ARTHROPLASTY     Procedure: TOTAL HIP REPLACEMENT  [4] Family History Problem Relation Name Age of Onset   Stroke Father     Diabetes Father     Hypertension Father     Heart attack Father    [5] Allergies Allergen Reactions   Allopurinol Other (See Comments)    Unknown   Statins-Hmg-Coa Reductase Inhibitors GI Intolerance   Codeine Hives and Rash   Doxycycline Rash

## 2024-07-26 ENCOUNTER — Ambulatory Visit: Admitting: Podiatry

## 2024-08-18 ENCOUNTER — Ambulatory Visit: Admitting: Podiatry

## 2024-08-18 DIAGNOSIS — E11621 Type 2 diabetes mellitus with foot ulcer: Secondary | ICD-10-CM

## 2024-08-18 DIAGNOSIS — L97512 Non-pressure chronic ulcer of other part of right foot with fat layer exposed: Secondary | ICD-10-CM

## 2024-08-18 NOTE — Progress Notes (Unsigned)
 Photos taken.  Ulcerated callus on the distal-plantar aspect of the right hallux.  3+ pitting edema bilateral.  Try to get him set up for the lymphedema compression pumps.  Swabbed for culture because there was bloody purulent drainage expressed with debridement.  He is gena do Epsom salt soaks, peroxide, and dressing daily.

## 2024-08-25 ENCOUNTER — Ambulatory Visit: Payer: Self-pay | Admitting: Podiatry

## 2024-08-25 MED ORDER — LINEZOLID 600 MG PO TABS: 600.0000 mg | ORAL_TABLET | Freq: Two times a day (BID) | ORAL | 0 refills | Status: DC

## 2024-08-30 ENCOUNTER — Ambulatory Visit: Admitting: Podiatry

## 2024-08-30 DIAGNOSIS — M2031 Hallux varus (acquired), right foot: Secondary | ICD-10-CM

## 2024-08-30 DIAGNOSIS — I872 Venous insufficiency (chronic) (peripheral): Secondary | ICD-10-CM

## 2024-08-30 DIAGNOSIS — E11621 Type 2 diabetes mellitus with foot ulcer: Secondary | ICD-10-CM

## 2024-08-30 DIAGNOSIS — L97512 Non-pressure chronic ulcer of other part of right foot with fat layer exposed: Secondary | ICD-10-CM

## 2024-08-30 NOTE — Progress Notes (Unsigned)
 Home lymphadema pumps Flexor tenotomy next visit?

## 2024-09-01 ENCOUNTER — Encounter: Payer: Self-pay | Admitting: Podiatry

## 2024-09-06 ENCOUNTER — Telehealth: Payer: Self-pay | Admitting: Podiatry

## 2024-09-06 ENCOUNTER — Other Ambulatory Visit: Payer: Self-pay | Admitting: Podiatry

## 2024-09-06 MED ORDER — LINEZOLID 600 MG PO TABS: 600.0000 mg | ORAL_TABLET | Freq: Two times a day (BID) | ORAL | 0 refills | Status: AC

## 2024-09-06 NOTE — Progress Notes (Signed)
 Patient called stating that his pharmacy claims that they have still not received a prescription for the linezolid  that was sent on 08/25/2024.  Attempted to call the pharmacy for clarification if there is any issue with the original order, and also resent the prescription again today while waiting on hold  Spoke with the pharmacy representative.  She states that the original prescription was filled and has been waiting on him to pick up.  It will cost him $49.  This was the [facility] in [place].  For clarification, I informed her he only needs one of the prescriptions for the linezolid .  She stated that she would note it and it is ready for him to pick up at any time.

## 2024-09-06 NOTE — Progress Notes (Signed)
 Spoke with pt and he will pick up medication and start it. We will see him back on 09/21/24

## 2024-09-06 NOTE — Telephone Encounter (Signed)
 Patient called in stating that [facility] in [place] has not received prescription.

## 2024-09-09 ENCOUNTER — Telehealth: Payer: Self-pay | Admitting: Podiatry

## 2024-09-09 NOTE — Telephone Encounter (Signed)
 Pt came in [place] office asking if there is another medication he can take other than the  linezolid  (ZYVOX ) 600 MG tablet that was prescribed to him. He states the linezolid  prescribed is too expensive for him.

## 2024-09-21 ENCOUNTER — Ambulatory Visit: Admitting: Podiatry

## 2024-09-21 DIAGNOSIS — M79674 Pain in right toe(s): Secondary | ICD-10-CM

## 2024-09-21 DIAGNOSIS — M2031 Hallux varus (acquired), right foot: Secondary | ICD-10-CM

## 2024-09-21 DIAGNOSIS — E1142 Type 2 diabetes mellitus with diabetic polyneuropathy: Secondary | ICD-10-CM | POA: Diagnosis not present

## 2024-09-21 DIAGNOSIS — I872 Venous insufficiency (chronic) (peripheral): Secondary | ICD-10-CM

## 2024-09-21 DIAGNOSIS — B351 Tinea unguium: Secondary | ICD-10-CM | POA: Diagnosis not present

## 2024-09-21 DIAGNOSIS — M79675 Pain in left toe(s): Secondary | ICD-10-CM

## 2024-09-21 NOTE — Progress Notes (Signed)
 "      Chief Complaint  Patient presents with   Wound Check    Right hallux, wound. Closed and callused. He does not want us  to cut on it. He is very upset about the confusion. He also states that his feet are waking him up at night and he has to stand up to get relief.     HPI: 84 y.o. male presents today with several pedal complaints.  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  This does appear to be doing well today and appears improved.  He has been using home lymphedema pumps due to chronic lower extremity swelling and edema, reports that it is helping some.  He does report bilateral lower extremity pain that is diffuse.  States that he notices in the bottoms of his feet especially at nighttime.  Reports burning and tingling associated with this.  He thinks that it does better on days where he uses the lymphedema pumps.  He does report low back pain.  He does have neuropathy and takes Cymbalta .  He is diabetic, unsure of last A1c, comes in with painful, thickened, elongated dystrophic toenails that cause pain with direct pressure and shoe gear.  He is unable to maintain them himself due to their thickened nature and due to mobility issues.  Past Medical History:  Diagnosis Date   Benign prostatic hyperplasia (BPH) with urinary urgency    Bile duct stone    Cervical disc disease    CHF (congestive heart failure) (HCC)    Chronic kidney disease (CKD), stage III (moderate) (HCC)    pt unaware   Coronary artery disease    pt unaware   Diabetes mellitus without complication (HCC)    Diabetes, polyneuropathy (HCC)    Diastolic dysfunction    ED (erectile dysfunction)    Edema    Elevated hemidiaphragm    Elevated liver function tests    Gait abnormality    Gallstones    GERD (gastroesophageal reflux disease)    Gout    History of anticoagulant therapy    History of rib fracture    Hyperlipidemia     Hypertension    Lung mass    Major depression    Malaise and fatigue    Mouth pain    OA (osteoarthritis)    Pneumonia    history of   Prostate disease    Pulmonary emboli (HCC)    Sleep apnea    does not use CPAP, resolved   Stool incontinence    Testosterone deficiency    Vitamin B 12 deficiency    Weight loss     Past Surgical History:  Procedure Laterality Date   BILIARY DILATION  01/22/2019   Procedure: BILIARY DILATION;  Surgeon: Rosalie Kitchens, MD;  Location: [facility];  Service: Endoscopy;;   BILIARY STENT PLACEMENT N/A 01/01/2019   Procedure: BILIARY STENT PLACEMENT;  Surgeon: Burnette Fallow, MD;  Location: [facility];  Service: Endoscopy;  Laterality: N/A;   BILIARY STENT PLACEMENT N/A 01/22/2019   Procedure: BILIARY STENT PLACEMENT;  Surgeon: Rosalie Kitchens, MD;  Location: [facility];  Service: Endoscopy;  Laterality: N/A;   CARDIAC CATHETERIZATION  2014   ERCP N/A 01/01/2019   Procedure: ENDOSCOPIC RETROGRADE CHOLANGIOPANCREATOGRAPHY (ERCP);  Surgeon: Burnette Fallow, MD;  Location: [facility];  Service: Endoscopy;  Laterality: N/A;   ERCP N/A 01/22/2019   Procedure: ENDOSCOPIC RETROGRADE CHOLANGIOPANCREATOGRAPHY (ERCP);  Surgeon: Rosalie Kitchens, MD;  Location:  [facility];  Service: Endoscopy;  Laterality: N/A;  with spyglass and lithrotipsy   ERCP N/A 02/18/2019   Procedure: ENDOSCOPIC RETROGRADE CHOLANGIOPANCREATOGRAPHY (ERCP);  Surgeon: Rosalie Kitchens, MD;  Location: [facility];  Service: Endoscopy;  Laterality: N/A;  WITH SPYGLASS    LAPAROSCOPIC CHOLECYSTECTOMY SINGLE SITE WITH INTRAOPERATIVE CHOLANGIOGRAM N/A 12/31/2018   Procedure: LAPAROSCOPIC CHOLECYSTECTOMY SINGLE SITE WITH INTRAOPERATIVE CHOLANGIOGRAM, REPAIR OF UMBILICAL HERNIA;  Surgeon: Sheldon Standing, MD;  Location: [facility];  Service: General;  Laterality: N/A;   REMOVAL OF STONES  01/01/2019   Procedure: REMOVAL OF STONES;  Surgeon: Burnette Fallow, MD;  Location: [facility];  Service: Endoscopy;;   REMOVAL OF  STONES  01/22/2019   Procedure: REMOVAL OF STONES;  Surgeon: Rosalie Kitchens, MD;  Location: [facility];  Service: Endoscopy;;   REMOVAL OF STONES  02/18/2019   Procedure: REMOVAL OF STONES;  Surgeon: Rosalie Kitchens, MD;  Location: [facility];  Service: Endoscopy;;   right hip replacement     SPHINCTEROTOMY  01/01/2019   Procedure: ANNETT;  Surgeon: Burnette Fallow, MD;  Location: [facility];  Service: Endoscopy;;   SPHINCTEROTOMY  01/22/2019   Procedure: ANNETT;  Surgeon: Rosalie Kitchens, MD;  Location: [facility];  Service: Endoscopy;;   SPHINCTEROTOMY  02/18/2019   Procedure: ANNETT;  Surgeon: Rosalie Kitchens, MD;  Location: [facility];  Service: Endoscopy;;   SPYGLASS CHOLANGIOSCOPY N/A 01/22/2019   Procedure: DEBHOJDD CHOLANGIOSCOPY;  Surgeon: Rosalie Kitchens, MD;  Location: [facility];  Service: Endoscopy;  Laterality: N/A;   SPYGLASS LITHOTRIPSY N/A 01/22/2019   Procedure: DEBHOJDD LITHOTRIPSY;  Surgeon: Rosalie Kitchens, MD;  Location: [facility];  Service: Endoscopy;  Laterality: N/A;   STENT REMOVAL  01/22/2019   Procedure: STENT REMOVAL;  Surgeon: Rosalie Kitchens, MD;  Location: [facility];  Service: Endoscopy;;   STENT REMOVAL  02/18/2019   Procedure: STENT REMOVAL;  Surgeon: Rosalie Kitchens, MD;  Location: [facility];  Service: Endoscopy;;    Allergies[1]  ROS    Physical Exam: There were no vitals filed for this visit.  General: The patient is alert and oriented x3 in no acute distress.  Dermatology: Pedal skin thin and atrophic.  Skin changes consistent with lymphedema present to bilateral lower extremities.  Water  blisters present.  No active drainage.  Hemosiderin deposits.  Hemorrhagic calluses right first toe present without underlying ulceration today, improved from previous.  Nailplates x 10 are thickened, elongated dystrophic with yellow discoloration subungual debris.  They are tender on direct dorsal palpation.  Vascular: Pedal pulses are difficult to  palpate due to significant edema.  Cap refill intact to the digits.  +2  pitting edema present bilaterally.  Decreased pedal hair growth.  Chronic skin changes from chronic venous insufficiency and lymphedema.   Neurological: Protective sensation decreased.  Light touch sensation decreased.  Pain with single leg raise right lower extremity  Musculoskeletal Exam: Pes planus foot type noted bilaterally. Semireducible hammertoe contractures bilaterally. There is right foot hallux malleus present with the interphalangeal joint plantarflexed. It is semirigid, does seem to be partially reducible     Assessment/Plan of Care: 1. Diabetic polyneuropathy associated with type 2 diabetes mellitus (HCC)   2. Hallux malleus, right   3. Pain due to onychomycosis of toenails of both feet   4. Chronic venous insufficiency of lower extremity      No orders of the defined types were placed in this encounter.  None  Discussed clinical findings with patient today.  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 Discussed proper diabetic foot care and discussed risks and complications of disease. Educated patient in depth on reasons to return to the office immediately should he/she discover anything concerning or new on the feet. All questions answered. Discussed proper shoes as well.  - Currently taking Cymbalta , states that he cannot take gabapentin - Pain with single leg raise, do think that low back pain could also be contributing factor to patient's pain at nighttime - Placing referral to neurologist for neuropathy - Difficult to discern if some of the pain is related to the significant lower extremity swelling  # Hallux limitus right foot - Has contributed to her ulceration of the right first toe which currently appears healed - Strong consideration for flexor tenotomy if ongoing and recurring.  We have discussed this at length numerous times with the patient.  He has  been hesitant about this.  If so can remain padded and limit recurrence of the ulceration this can be avoided.  He does alternatively complain that it feels as if his entire body weight is on the right first toe when he walks due to his contracted nature. -If he does wish to proceed with this would request that the speak with his PCP about his warfarin, this would be a minimally invasive percutaneous procedure done under local anesthesia in the office    #Onychomycosis with pain  -Nails palliatively debrided as below. -Educated on self-care  Procedure: Nail Debridement Rationale: Pain Type of Debridement: manual, sharp debridement. Instrumentation: Nail nipper, rotary burr. Number of Nails: 10  # Venous insufficiency with lymphedema - Continue home lymphedema pumps - Has previously had lymphedema wraps prescribed which were helpful however limited compliance, patient has difficulty applying them himself. -Likely a contributing to some of the chronic lower extremity pain -Does follow vascular provider for this.   Follow-up in 3 months for diabetic footcare.  Follow-up as needed symptoms recur or worsen or if there is recurrence of ulceration.    Donalyn Schneeberger L. Lamount MAUL, AACFAS [facility]     [address] [place], [place] [address]                Office [phone]  Fax [phone]       [1]  Allergies Allergen Reactions   Codeine Hives   Statins Nausea Only    Cramps (ALLERGY/intolerance)   Allopurinol Rash    Unknown   Doxycycline Rash    Unknown   "

## 2024-09-26 ENCOUNTER — Encounter: Payer: Self-pay | Admitting: Podiatry

## 2024-11-23 ENCOUNTER — Ambulatory Visit: Admitting: Podiatry
